# Patient Record
Sex: Female | Born: 1960
Health system: Southern US, Community
[De-identification: ages and names within clinical notes are randomized; demographics above are authoritative.]

## PROBLEM LIST (undated history)

## (undated) DIAGNOSIS — M545 Low back pain, unspecified: Secondary | ICD-10-CM

## (undated) DIAGNOSIS — I10 Essential (primary) hypertension: Secondary | ICD-10-CM

## (undated) DIAGNOSIS — Z87442 Personal history of urinary calculi: Secondary | ICD-10-CM

## (undated) DIAGNOSIS — E785 Hyperlipidemia, unspecified: Secondary | ICD-10-CM

## (undated) DIAGNOSIS — J45909 Unspecified asthma, uncomplicated: Secondary | ICD-10-CM

## (undated) DIAGNOSIS — G4733 Obstructive sleep apnea (adult) (pediatric): Principal | ICD-10-CM

## (undated) DIAGNOSIS — K219 Gastro-esophageal reflux disease without esophagitis: Secondary | ICD-10-CM

## (undated) HISTORY — PX: ABDOMINAL HYSTERECTOMY: SHX81

## (undated) HISTORY — DX: Morbid (severe) obesity due to excess calories: E66.01

## (undated) HISTORY — DX: Low back pain, unspecified: M54.50

## (undated) HISTORY — DX: Hyperlipidemia, unspecified: E78.5

## (undated) HISTORY — DX: Low back pain: M54.5

## (undated) HISTORY — DX: Essential (primary) hypertension: I10

## (undated) HISTORY — PX: TUBAL LIGATION: SHX77

## (undated) HISTORY — DX: Obstructive sleep apnea (adult) (pediatric): G47.33

## (undated) HISTORY — PX: DENTAL SURGERY: SHX609

---

## 2003-02-02 ENCOUNTER — Other Ambulatory Visit: Admission: RE | Admit: 2003-02-02 | Discharge: 2003-02-02 | Payer: Self-pay | Admitting: Obstetrics & Gynecology

## 2004-05-06 LAB — HM MAMMOGRAPHY

## 2004-09-11 ENCOUNTER — Ambulatory Visit: Payer: Self-pay | Admitting: Internal Medicine

## 2004-09-24 ENCOUNTER — Encounter (INDEPENDENT_AMBULATORY_CARE_PROVIDER_SITE_OTHER): Payer: Self-pay | Admitting: *Deleted

## 2004-09-24 ENCOUNTER — Observation Stay (HOSPITAL_COMMUNITY): Admission: RE | Admit: 2004-09-24 | Discharge: 2004-09-25 | Payer: Self-pay | Admitting: Obstetrics & Gynecology

## 2004-10-18 ENCOUNTER — Ambulatory Visit (HOSPITAL_COMMUNITY): Admission: RE | Admit: 2004-10-18 | Discharge: 2004-10-18 | Payer: Self-pay | Admitting: Internal Medicine

## 2004-10-25 ENCOUNTER — Ambulatory Visit: Payer: Self-pay | Admitting: Internal Medicine

## 2004-10-25 ENCOUNTER — Ambulatory Visit (HOSPITAL_COMMUNITY): Admission: RE | Admit: 2004-10-25 | Discharge: 2004-10-25 | Payer: Self-pay | Admitting: Internal Medicine

## 2004-10-30 ENCOUNTER — Ambulatory Visit: Payer: Self-pay | Admitting: Internal Medicine

## 2005-05-17 ENCOUNTER — Ambulatory Visit: Payer: Self-pay | Admitting: Internal Medicine

## 2005-10-08 ENCOUNTER — Ambulatory Visit: Payer: Self-pay | Admitting: Internal Medicine

## 2005-10-14 ENCOUNTER — Emergency Department (HOSPITAL_COMMUNITY): Admission: EM | Admit: 2005-10-14 | Discharge: 2005-10-14 | Payer: Self-pay | Admitting: Family Medicine

## 2005-10-21 ENCOUNTER — Emergency Department (HOSPITAL_COMMUNITY): Admission: EM | Admit: 2005-10-21 | Discharge: 2005-10-21 | Payer: Self-pay | Admitting: Family Medicine

## 2005-10-25 ENCOUNTER — Ambulatory Visit: Payer: Self-pay | Admitting: Internal Medicine

## 2006-02-13 ENCOUNTER — Inpatient Hospital Stay (HOSPITAL_COMMUNITY): Admission: EM | Admit: 2006-02-13 | Discharge: 2006-02-14 | Payer: Self-pay | Admitting: Family Medicine

## 2006-02-13 ENCOUNTER — Encounter: Payer: Self-pay | Admitting: Cardiology

## 2006-02-13 ENCOUNTER — Ambulatory Visit: Payer: Self-pay | Admitting: Cardiology

## 2006-02-14 ENCOUNTER — Ambulatory Visit: Payer: Self-pay | Admitting: Internal Medicine

## 2006-02-25 ENCOUNTER — Ambulatory Visit: Payer: Self-pay

## 2006-02-25 ENCOUNTER — Ambulatory Visit: Payer: Self-pay | Admitting: Internal Medicine

## 2006-03-04 ENCOUNTER — Ambulatory Visit: Payer: Self-pay | Admitting: Cardiology

## 2006-05-13 ENCOUNTER — Ambulatory Visit: Payer: Self-pay | Admitting: Cardiology

## 2006-06-20 ENCOUNTER — Ambulatory Visit: Payer: Self-pay | Admitting: Internal Medicine

## 2006-06-27 ENCOUNTER — Ambulatory Visit: Payer: Self-pay | Admitting: Cardiology

## 2006-06-27 LAB — CONVERTED CEMR LAB
CO2: 32 meq/L (ref 19–32)
GFR calc Af Amer: 78 mL/min
GFR calc non Af Amer: 64 mL/min
Glucose, Bld: 123 mg/dL — ABNORMAL HIGH (ref 70–99)
Sodium: 140 meq/L (ref 135–145)

## 2006-07-03 ENCOUNTER — Ambulatory Visit: Payer: Self-pay | Admitting: Internal Medicine

## 2006-07-03 ENCOUNTER — Ambulatory Visit: Payer: Self-pay | Admitting: Cardiology

## 2006-07-08 ENCOUNTER — Ambulatory Visit: Payer: Self-pay | Admitting: Cardiology

## 2006-07-08 LAB — CONVERTED CEMR LAB
Basophils Absolute: 0 10*3/uL (ref 0.0–0.1)
Chloride: 105 meq/L (ref 96–112)
Eosinophils Absolute: 0.2 10*3/uL (ref 0.0–0.6)
GFR calc Af Amer: 70 mL/min
GFR calc non Af Amer: 58 mL/min
Glucose, Bld: 124 mg/dL — ABNORMAL HIGH (ref 70–99)
HCT: 40.4 % (ref 36.0–46.0)
Lymphocytes Relative: 31.1 % (ref 12.0–46.0)
MCHC: 33.2 g/dL (ref 30.0–36.0)
MCV: 92.9 fL (ref 78.0–100.0)
Neutro Abs: 3.9 10*3/uL (ref 1.4–7.7)
Neutrophils Relative %: 58.4 % (ref 43.0–77.0)
Platelets: 428 10*3/uL — ABNORMAL HIGH (ref 150–400)
Potassium: 4.1 meq/L (ref 3.5–5.1)
RBC: 4.35 M/uL (ref 3.87–5.11)
Sodium: 140 meq/L (ref 135–145)
aPTT: 35.3 s (ref 26.5–36.5)

## 2006-07-11 ENCOUNTER — Ambulatory Visit: Payer: Self-pay | Admitting: Cardiology

## 2006-07-11 ENCOUNTER — Ambulatory Visit (HOSPITAL_COMMUNITY): Admission: RE | Admit: 2006-07-11 | Discharge: 2006-07-11 | Payer: Self-pay | Admitting: Cardiology

## 2006-07-11 ENCOUNTER — Encounter (INDEPENDENT_AMBULATORY_CARE_PROVIDER_SITE_OTHER): Payer: Self-pay | Admitting: *Deleted

## 2006-08-05 ENCOUNTER — Ambulatory Visit: Payer: Self-pay

## 2006-08-05 ENCOUNTER — Ambulatory Visit: Payer: Self-pay | Admitting: Cardiology

## 2006-09-24 ENCOUNTER — Encounter: Payer: Self-pay | Admitting: Internal Medicine

## 2006-09-24 ENCOUNTER — Ambulatory Visit: Payer: Self-pay | Admitting: Internal Medicine

## 2006-12-25 ENCOUNTER — Encounter: Payer: Self-pay | Admitting: Internal Medicine

## 2006-12-25 DIAGNOSIS — I701 Atherosclerosis of renal artery: Secondary | ICD-10-CM | POA: Insufficient documentation

## 2006-12-25 DIAGNOSIS — I10 Essential (primary) hypertension: Secondary | ICD-10-CM | POA: Insufficient documentation

## 2006-12-29 DIAGNOSIS — E7800 Pure hypercholesterolemia, unspecified: Secondary | ICD-10-CM | POA: Insufficient documentation

## 2006-12-29 DIAGNOSIS — M545 Low back pain, unspecified: Secondary | ICD-10-CM | POA: Insufficient documentation

## 2006-12-29 DIAGNOSIS — J309 Allergic rhinitis, unspecified: Secondary | ICD-10-CM | POA: Insufficient documentation

## 2006-12-29 DIAGNOSIS — E785 Hyperlipidemia, unspecified: Secondary | ICD-10-CM | POA: Insufficient documentation

## 2007-06-22 ENCOUNTER — Telehealth: Payer: Self-pay | Admitting: Internal Medicine

## 2007-07-07 ENCOUNTER — Ambulatory Visit: Payer: Self-pay | Admitting: Internal Medicine

## 2007-07-07 DIAGNOSIS — R351 Nocturia: Secondary | ICD-10-CM | POA: Insufficient documentation

## 2007-07-07 DIAGNOSIS — R7309 Other abnormal glucose: Secondary | ICD-10-CM | POA: Insufficient documentation

## 2007-07-21 ENCOUNTER — Ambulatory Visit: Payer: Self-pay | Admitting: Pulmonary Disease

## 2007-07-21 DIAGNOSIS — G4730 Sleep apnea, unspecified: Secondary | ICD-10-CM | POA: Insufficient documentation

## 2007-08-11 ENCOUNTER — Ambulatory Visit: Payer: Self-pay | Admitting: Internal Medicine

## 2007-08-12 ENCOUNTER — Telehealth (INDEPENDENT_AMBULATORY_CARE_PROVIDER_SITE_OTHER): Payer: Self-pay | Admitting: *Deleted

## 2007-08-17 LAB — CONVERTED CEMR LAB
ALT: 24 units/L (ref 0–35)
AST: 22 units/L (ref 0–37)
Alkaline Phosphatase: 67 units/L (ref 39–117)
Bilirubin, Direct: 0.1 mg/dL (ref 0.0–0.3)
CO2: 34 meq/L — ABNORMAL HIGH (ref 19–32)
Chloride: 98 meq/L (ref 96–112)
Glucose, Bld: 117 mg/dL — ABNORMAL HIGH (ref 70–99)
HDL: 43.8 mg/dL (ref >39.0)
Microalb, Ur: 2.2 mg/dL — ABNORMAL HIGH (ref 0.0–1.9)
Potassium: 3.1 meq/L — ABNORMAL LOW (ref 3.5–5.1)
Sodium: 139 meq/L (ref 135–145)
TSH: 1.16 microintl units/mL (ref 0.35–5.50)
Total Bilirubin: 0.6 mg/dL (ref 0.3–1.2)
Total CHOL/HDL Ratio: 4.2
Total Protein: 7.1 g/dL (ref 6.0–8.3)

## 2007-08-19 ENCOUNTER — Ambulatory Visit: Payer: Self-pay | Admitting: Internal Medicine

## 2007-08-19 DIAGNOSIS — E876 Hypokalemia: Secondary | ICD-10-CM | POA: Insufficient documentation

## 2007-09-03 ENCOUNTER — Telehealth: Payer: Self-pay | Admitting: Internal Medicine

## 2007-09-14 ENCOUNTER — Ambulatory Visit: Payer: Self-pay | Admitting: Internal Medicine

## 2007-09-14 LAB — CONVERTED CEMR LAB
CO2: 34 meq/L — ABNORMAL HIGH (ref 19–32)
Calcium: 8.7 mg/dL (ref 8.4–10.5)
Glucose, Bld: 147 mg/dL — ABNORMAL HIGH (ref 70–99)
Potassium: 2.5 meq/L — CL (ref 3.5–5.1)
Sodium: 140 meq/L (ref 135–145)

## 2007-09-18 ENCOUNTER — Telehealth: Payer: Self-pay | Admitting: Internal Medicine

## 2007-09-18 ENCOUNTER — Ambulatory Visit: Payer: Self-pay | Admitting: Internal Medicine

## 2007-09-27 ENCOUNTER — Emergency Department (HOSPITAL_COMMUNITY): Admission: EM | Admit: 2007-09-27 | Discharge: 2007-09-27 | Payer: Self-pay | Admitting: Family Medicine

## 2007-09-30 ENCOUNTER — Telehealth: Payer: Self-pay | Admitting: *Deleted

## 2007-09-30 LAB — CONVERTED CEMR LAB: Potassium: 3.4 meq/L — ABNORMAL LOW (ref 3.5–5.3)

## 2007-10-28 ENCOUNTER — Ambulatory Visit: Payer: Self-pay | Admitting: Internal Medicine

## 2007-10-30 LAB — CONVERTED CEMR LAB
CO2: 31 meq/L (ref 19–32)
Calcium: 9.1 mg/dL (ref 8.4–10.5)
GFR calc Af Amer: 77 mL/min
Glucose, Bld: 90 mg/dL (ref 70–99)
Potassium: 2.9 meq/L — ABNORMAL LOW (ref 3.5–5.1)
Sodium: 139 meq/L (ref 135–145)

## 2007-11-23 ENCOUNTER — Encounter: Payer: Self-pay | Admitting: Internal Medicine

## 2007-11-23 ENCOUNTER — Emergency Department (HOSPITAL_COMMUNITY): Admission: EM | Admit: 2007-11-23 | Discharge: 2007-11-23 | Payer: Self-pay | Admitting: *Deleted

## 2007-11-23 ENCOUNTER — Telehealth: Payer: Self-pay | Admitting: Internal Medicine

## 2007-12-02 ENCOUNTER — Ambulatory Visit: Payer: Self-pay | Admitting: Internal Medicine

## 2007-12-02 DIAGNOSIS — T50995A Adverse effect of other drugs, medicaments and biological substances, initial encounter: Secondary | ICD-10-CM | POA: Insufficient documentation

## 2007-12-09 ENCOUNTER — Telehealth: Payer: Self-pay | Admitting: *Deleted

## 2008-01-06 ENCOUNTER — Encounter: Payer: Self-pay | Admitting: *Deleted

## 2008-01-12 ENCOUNTER — Telehealth: Payer: Self-pay | Admitting: *Deleted

## 2008-01-12 LAB — CONVERTED CEMR LAB
Chloride: 97 meq/L (ref 96–112)
GFR calc Af Amer: 87 mL/min
GFR calc non Af Amer: 72 mL/min
Glucose, Bld: 115 mg/dL — ABNORMAL HIGH (ref 70–99)
Potassium: 2.9 meq/L — ABNORMAL LOW (ref 3.5–5.1)
Sodium: 137 meq/L (ref 135–145)

## 2008-02-12 ENCOUNTER — Telehealth: Payer: Self-pay | Admitting: *Deleted

## 2008-03-14 ENCOUNTER — Telehealth: Payer: Self-pay | Admitting: Internal Medicine

## 2008-05-23 ENCOUNTER — Encounter: Payer: Self-pay | Admitting: Internal Medicine

## 2008-05-23 ENCOUNTER — Telehealth: Payer: Self-pay | Admitting: *Deleted

## 2008-05-25 ENCOUNTER — Telehealth: Payer: Self-pay | Admitting: *Deleted

## 2008-05-27 ENCOUNTER — Ambulatory Visit: Payer: Self-pay | Admitting: Internal Medicine

## 2008-06-03 LAB — CONVERTED CEMR LAB
ALT: 20 units/L (ref 0–35)
Albumin: 3.4 g/dL — ABNORMAL LOW (ref 3.5–5.2)
CO2: 34 meq/L — ABNORMAL HIGH (ref 19–32)
Calcium: 9.1 mg/dL (ref 8.4–10.5)
Creatinine, Ser: 0.9 mg/dL (ref 0.4–1.2)
GFR calc Af Amer: 87 mL/min
Glucose, Bld: 141 mg/dL — ABNORMAL HIGH (ref 70–99)
HDL: 40.6 mg/dL (ref >39.0)
Hgb A1c MFr Bld: 7 % — ABNORMAL HIGH (ref 4.6–6.0)
TSH: 1.03 microintl units/mL (ref 0.35–5.50)
Total Protein: 7.5 g/dL (ref 6.0–8.3)
Triglycerides: 110 mg/dL (ref 0–149)
VLDL: 22 mg/dL (ref 0–40)

## 2008-06-07 ENCOUNTER — Telehealth: Payer: Self-pay | Admitting: *Deleted

## 2008-06-13 ENCOUNTER — Telehealth: Payer: Self-pay | Admitting: Internal Medicine

## 2008-06-29 ENCOUNTER — Emergency Department (HOSPITAL_COMMUNITY): Admission: EM | Admit: 2008-06-29 | Discharge: 2008-06-29 | Payer: Self-pay | Admitting: Emergency Medicine

## 2008-09-09 ENCOUNTER — Telehealth: Payer: Self-pay | Admitting: *Deleted

## 2008-09-09 ENCOUNTER — Telehealth: Payer: Self-pay | Admitting: Internal Medicine

## 2008-09-09 ENCOUNTER — Ambulatory Visit: Payer: Self-pay | Admitting: Internal Medicine

## 2008-09-09 DIAGNOSIS — R350 Frequency of micturition: Secondary | ICD-10-CM | POA: Insufficient documentation

## 2008-09-16 ENCOUNTER — Ambulatory Visit: Payer: Self-pay | Admitting: Internal Medicine

## 2008-09-16 LAB — CONVERTED CEMR LAB
BUN: 12 mg/dL (ref 6–23)
Creatinine, Ser: 1 mg/dL (ref 0.4–1.2)
GFR calc non Af Amer: 76.89 mL/min (ref >60)
Potassium: 2.9 meq/L — ABNORMAL LOW (ref 3.5–5.1)

## 2008-09-19 ENCOUNTER — Telehealth: Payer: Self-pay | Admitting: Internal Medicine

## 2008-10-07 ENCOUNTER — Telehealth: Payer: Self-pay | Admitting: Internal Medicine

## 2008-10-12 ENCOUNTER — Ambulatory Visit: Payer: Self-pay | Admitting: Internal Medicine

## 2008-10-18 LAB — CONVERTED CEMR LAB: Potassium: 3.6 meq/L (ref 3.5–5.1)

## 2008-12-05 ENCOUNTER — Ambulatory Visit: Payer: Self-pay | Admitting: Internal Medicine

## 2008-12-05 ENCOUNTER — Telehealth: Payer: Self-pay | Admitting: Internal Medicine

## 2008-12-09 LAB — CONVERTED CEMR LAB
Calcium: 8.7 mg/dL (ref 8.4–10.5)
Creatinine, Ser: 0.8 mg/dL (ref 0.4–1.2)
GFR calc non Af Amer: 99.37 mL/min (ref >60)
Hgb A1c MFr Bld: 6.5 % (ref 4.6–6.5)
Magnesium: 2 mg/dL (ref 1.5–2.5)

## 2009-06-09 ENCOUNTER — Ambulatory Visit: Payer: Self-pay | Admitting: Internal Medicine

## 2009-06-26 ENCOUNTER — Telehealth: Payer: Self-pay | Admitting: Internal Medicine

## 2009-07-07 ENCOUNTER — Telehealth: Payer: Self-pay | Admitting: *Deleted

## 2009-07-28 ENCOUNTER — Encounter: Payer: Self-pay | Admitting: *Deleted

## 2009-09-04 ENCOUNTER — Encounter: Payer: Self-pay | Admitting: Internal Medicine

## 2009-09-08 ENCOUNTER — Ambulatory Visit: Payer: Self-pay | Admitting: Internal Medicine

## 2009-09-08 LAB — CONVERTED CEMR LAB
BUN: 12 mg/dL (ref 6–23)
Cholesterol: 184 mg/dL (ref 0–200)
GFR calc non Af Amer: 99.04 mL/min (ref >60)
Total CHOL/HDL Ratio: 4
Triglycerides: 105 mg/dL (ref 0.0–149.0)

## 2009-09-15 ENCOUNTER — Ambulatory Visit: Payer: Self-pay | Admitting: Internal Medicine

## 2009-09-27 ENCOUNTER — Telehealth: Payer: Self-pay | Admitting: *Deleted

## 2009-10-13 ENCOUNTER — Ambulatory Visit: Payer: Self-pay | Admitting: Internal Medicine

## 2009-10-13 DIAGNOSIS — R05 Cough: Secondary | ICD-10-CM

## 2009-10-13 DIAGNOSIS — R053 Chronic cough: Secondary | ICD-10-CM | POA: Insufficient documentation

## 2009-11-17 ENCOUNTER — Ambulatory Visit: Payer: Self-pay | Admitting: Internal Medicine

## 2009-11-17 DIAGNOSIS — M79609 Pain in unspecified limb: Secondary | ICD-10-CM | POA: Insufficient documentation

## 2009-11-20 ENCOUNTER — Telehealth: Payer: Self-pay | Admitting: *Deleted

## 2009-11-21 ENCOUNTER — Encounter: Payer: Self-pay | Admitting: Internal Medicine

## 2009-11-22 ENCOUNTER — Telehealth: Payer: Self-pay | Admitting: *Deleted

## 2009-11-27 ENCOUNTER — Telehealth: Payer: Self-pay | Admitting: Internal Medicine

## 2009-12-11 ENCOUNTER — Ambulatory Visit: Payer: Self-pay | Admitting: Internal Medicine

## 2009-12-11 ENCOUNTER — Ambulatory Visit: Payer: Self-pay

## 2009-12-11 ENCOUNTER — Ambulatory Visit (HOSPITAL_COMMUNITY): Admission: RE | Admit: 2009-12-11 | Discharge: 2009-12-11 | Payer: Self-pay | Admitting: Internal Medicine

## 2009-12-11 ENCOUNTER — Encounter: Payer: Self-pay | Admitting: Internal Medicine

## 2009-12-12 ENCOUNTER — Telehealth: Payer: Self-pay | Admitting: *Deleted

## 2010-01-04 ENCOUNTER — Ambulatory Visit: Payer: Self-pay | Admitting: Cardiology

## 2010-01-17 ENCOUNTER — Telehealth: Payer: Self-pay | Admitting: Cardiology

## 2010-01-19 ENCOUNTER — Encounter: Payer: Self-pay | Admitting: Cardiology

## 2010-02-02 ENCOUNTER — Telehealth: Payer: Self-pay | Admitting: Cardiology

## 2010-02-20 ENCOUNTER — Encounter: Payer: Self-pay | Admitting: Internal Medicine

## 2010-03-02 ENCOUNTER — Telehealth: Payer: Self-pay | Admitting: Cardiology

## 2010-03-02 ENCOUNTER — Telehealth: Payer: Self-pay | Admitting: *Deleted

## 2010-03-20 ENCOUNTER — Encounter: Payer: Self-pay | Admitting: Internal Medicine

## 2010-05-12 ENCOUNTER — Encounter: Payer: Self-pay | Admitting: Internal Medicine

## 2010-05-25 ENCOUNTER — Telehealth: Payer: Self-pay | Admitting: Cardiology

## 2010-05-27 ENCOUNTER — Encounter: Payer: Self-pay | Admitting: Internal Medicine

## 2010-06-03 LAB — CONVERTED CEMR LAB
BUN: 11 mg/dL (ref 6–23)
Basophils Absolute: 0 10*3/uL (ref 0.0–0.1)
Blood in Urine, dipstick: NEGATIVE
Calcium: 8.9 mg/dL (ref 8.4–10.5)
Chloride: 103 meq/L (ref 96–112)
Creatinine, Ser: 0.9 mg/dL (ref 0.4–1.2)
Eosinophils Absolute: 0.1 10*3/uL (ref 0.0–0.7)
Hemoglobin: 13.7 g/dL (ref 12.0–15.0)
Hgb A1c MFr Bld: 6.9 % — ABNORMAL HIGH (ref 4.6–6.5)
Lymphocytes Relative: 32.3 % (ref 12.0–46.0)
Lymphs Abs: 1.7 10*3/uL (ref 0.7–4.0)
MCHC: 34.7 g/dL (ref 30.0–36.0)
Neutro Abs: 3.2 10*3/uL (ref 1.4–7.7)
Nitrite: NEGATIVE
RDW: 12.1 % (ref 11.5–14.6)
Specific Gravity, Urine: 1.02
WBC Urine, dipstick: NEGATIVE

## 2010-06-05 NOTE — Miscellaneous (Signed)
Summary: Appointment Canceled  Appointment status changed to canceled by [employer] on 11/27/2009 3:30 PM.  Cancellation Comments --------------------- appt @ 4:30/echo/enlarged heart on xray/htn  Appointment Information ----------------------- Appt Type:  CARDIOLOGY ANCILLARY VISIT      Date:  Thursday, November 30, 2009      Time:  4:00 PM for 60 min   Urgency:  Routine   Made By:  Pearson Grippe  To Visit:  [id number]    Reason:  appt @ 4:30/echo/enlarged heart on xray/htn  Appt Comments ------------- -- 11/27/09 15:30: ([facility]) CANCELED -- appt @ 4:30/echo/enlarged heart on xray/htn -- 11/21/09 12:45: ([facility]) BOOKED -- Routine CARDIOLOGY ANCILLARY VISIT at 11/30/2009 4:00 PM for 60 min appt @ 4:30/echo/enlarged heart on xray/htn  Appended Document: Appointment Canceled Help her rescedule the echo  and ask why it was canceled  ....she still needs the echo cardiogram done   soon    and definitely  before  appt with cards as this is not until September .     Appended Document: Appointment Canceled Pt rescheduled appt for Aug 8th.

## 2010-06-05 NOTE — Letter (Signed)
Summary: [facility]  [facility]   Imported By: Maryln Gottron 03/26/2010 12:14:59  _____________________________________________________________________  External Attachment:    Type:   Image     Comment:   External Document

## 2010-06-05 NOTE — Assessment & Plan Note (Signed)
Summary: np6/dx:chest and back pain/post echo/lg  Medications Added NORVASC 10 MG TABS (AMLODIPINE BESYLATE) one daily      Allergies Added:   Visit Type:  Follow-up Primary Provider:  Dr. Fabian Sharp   History of Present Illness: 50 yo with history of HTN and obesity presents for cardiac evaluation.  Patient has a history of early-onset HTN (since birth of her son at age 73) and was seen by Dr. Samule Ohm in the past.  She underwent an evaluation for secondary causes of HTN that included renal artery angiogram (no fibromuscular dysplasia).  She has been on many BP meds and has had side effects limiting the use of most.  She is only on Norvasc now and is tolerating it well.  She did not take her medicine this am, and BP is 159/94.  Has been running 140s/90s at home.  She does snore and stop breathing at night.  She has not had a sleep study.  No chest pain or dyspnea.  She does not get much exercise and work is stressful.  She tries to watch her sodium intake.   ECG: NSR, LVH  Current Medications (verified): 1)  Norvasc 5 Mg  Tabs (Amlodipine Besylate) .... One Tab Once Daily  Allergies (verified): 1)  ! * Atacand 2)  ! Motrin 3)  ! * Micardis 4)  ! Vito Berger  Past History:  Past Medical History: 1. Hypertension: x many years (since birth of her son at 47).  Had workup with renal artery angiogram that showed no evidence for fibromuscular dysplasia (no significant renal artery stenosis).  Echo (8/11): EF 60-65%, mild LVH, no regional WMAs.  2. Morbid Obesity 3. Allergic rhinitis 4. Diabetes mellitus, type II? vs hyperglycemia   5. Hyperlipidemia 6. Low back pain  Family History: Family History Hypertension Fa died MVA Mom HTN and cancer, is deceased  Social History: Never Smoked Married Alcohol use-no  Drug use-no Regular exercise-yes HH of 4 no pets  works at [employer]   Review of Systems       All systems reviewed and negative except as per HPI.   Vital  Signs:  Patient profile:   50 year old female Menstrual status:  hysterectomy Height:      67 inches Weight:      299 pounds BMI:     47.00 Pulse rate:   82 / minute BP sitting:   159 / 94  (left arm) Cuff size:   large  Vitals Entered By: Burnett Kanaris, CNA (January 04, 2010 12:09 PM)  Physical Exam  General:  Well developed, well nourished, in no acute distress. Obese.  Head:  normocephalic and atraumatic Nose:  no deformity, discharge, inflammation, or lesions Mouth:  Teeth, gums and palate normal. Oral mucosa normal. Neck:  Neck supple, no JVD. No masses, thyromegaly or abnormal cervical nodes. Lungs:  Clear bilaterally to auscultation and percussion. Heart:  Non-displaced PMI, chest non-tender; regular rate and rhythm, S1, S2 without murmurs, rubs or gallops. Carotid upstroke normal, no bruit. Pedals normal pulses. No edema, no varicosities. Abdomen:  Bowel sounds positive; abdomen soft and non-tender without masses, organomegaly, or hernias noted. No hepatosplenomegaly. Extremities:  No clubbing or cyanosis. Neurologic:  Alert and oriented x 3. Skin:  Intact without lesions or rashes. Psych:  Normal affect.   Impression & Recommendations:  Problem # 1:  HYPERTENSION (ICD-401.9) BP is running high today but has not taken Norvasc.  Even with her BP med, BP still runs 140s/90s.  No  renal artery stenosis.  - Increase Norvasc to 10 mg daily - Needs sleep study: suspect OSA, which can contribute to BP elevation. - She needs weight loss.  - Watch Na in diet.  - Followup 1 month to assess BP.  Would consider spironolactone if she needs more BP control given early-onset and resistant HTN (could be aldosterone-dependent).   Problem # 2:  MORBID OBESITY (ICD-278.01) Weight loss would certainly help BP. We discussed diet and exercise.    Other Orders: Misc. Referral (Misc. Ref)  Patient Instructions: 1)  Your physician has recommended you make the following change in your  medication:  2)  Increase Norvasc to 10mg  daily. 3)  Your physician has recommended that you have a sleep study.  This test records several body functions during sleep, including:  brain activity, eye movement, oxygen and carbon dioxide blood levels, heart rate and rhythm, breathing rate and rhythm, the flow of air through your mouth and nose, snoring, body muscle movements, and chest and belly movement. 4)  Your physician recommends that you schedule a follow-up appointment in: 1 month with Dr Shirlee Latch. Prescriptions: NORVASC 10 MG TABS (AMLODIPINE BESYLATE) one daily  #90 x 3   Entered by:   Katina Dung, RN, BSN   Authorized by:   Marca Ancona, MD   Signed by:   Katina Dung, RN, BSN on 01/04/2010   Method used:   Electronically to        [facility] Environmental education officer)       [facility]       [address]       [place], [place]  [address]       Ph: [phone]       Fax: [phone]   RxID:   [id number]

## 2010-06-05 NOTE — Progress Notes (Signed)
Summary: REFILL REQ ON MED  Phone Note Call from Patient   Caller: Patient [phone] Reason for Call: Refill Medication Summary of Call: Pt called to req that a refill on med : NORVASC.... Pt req that same be sent into [facility] on [address] / [facility].  because she is almost out of med... [name] KitchenPt usually uses [facility] but she is afraid that she will run out of med before they can get her the medication refill.... She adv that she contacted [facility] to have them send [facility] a refill request and they told her that they don't do that anymore during the beginning of the year because they are so busy.  Initial call taken by: Debbra Riding,  June 26, 2009 8:48 AM  Follow-up for Phone Call        Spoke to pt and she needs a [facility] and [facility]. Then by the way she  also states that she was having problems last week where she couldn't remember stuff and couldn't words get stuff out. Then pt was just repeating stuff. Pt was not having any blurred vision, no numbness or tingling down arms or legs. I offered pt an appt but pt couldn't come in tomorrow. Pt states that she would call back for an appt either friday or monday because she is going to take some time off then.  Follow-up by: Romualdo Bolk, CMA Duncan Dull),  June 26, 2009 12:25 PM  Additional Follow-up for Phone Call Additional follow up Details #1::        should seek emergent care if has  any further episodes. Additional Follow-up by: Madelin Headings MD,  June 27, 2009 12:47 PM    Prescriptions: NORVASC 5 MG  TABS (AMLODIPINE BESYLATE) one tab once daily  #90 x 3   Entered by:   Romualdo Bolk, CMA (AAMA)   Authorized by:   Madelin Headings MD   Signed by:   Romualdo Bolk, CMA (AAMA) on 06/26/2009   Method used:   Electronically to        [facility]. [address] [phone]* (retail)       [address]       [place], [place]  [address]       Ph: [phone] or  [phone]       Fax: [phone]   RxID:   [id number] NORVASC 5 MG  TABS (AMLODIPINE BESYLATE) one tab once daily  #30 x 0   Entered by:   Romualdo Bolk, CMA (AAMA)   Authorized by:   Madelin Headings MD   Signed by:   Romualdo Bolk, CMA (AAMA) on 06/26/2009   Method used:   Electronically to        [facility]. [address] [phone]* (retail)       [address]       [place], [place]  [address]       Ph: [phone] or [phone]       Fax: [phone]   RxID:   [id number]

## 2010-06-05 NOTE — Progress Notes (Signed)
Summary: REQ FOR RESULTS  Phone Note Call from Patient   Caller: Patient Summary of Call: Pt would like results of echo.... Pt can be reached at [phone] (cell). Initial call taken by: Debbra Riding,  December 12, 2009 12:09 PM  Follow-up for Phone Call        LM on pt's machine that md is out of the office and we will call her once md looks at the results. Follow-up by: Romualdo Bolk, CMA Duncan Dull),  December 12, 2009 12:17 PM  Additional Follow-up for Phone Call Additional follow up Details #1::        tell patient has mild thickening of heart muscle  as can be seen with  hypertension.   I want her to keep appt with cardiology and need to have BP under control and lose weight in the meantime. Additional Follow-up by: Madelin Headings MD,  December 18, 2009 10:04 PM    Additional Follow-up for Phone Call Additional follow up Details #2::    LMTOCB Romualdo Bolk, CMA (AAMA)  December 19, 2009 8:45 AM Pt called back and left a voicemail saying that we can leave her a message on her machine about the results. She also needed a refill on her norvasc sent to [facility]. Rx sent to pharmacy and results left on voicemail. Follow-up by: Romualdo Bolk, CMA (AAMA),  December 21, 2009 12:21 PM  Prescriptions: NORVASC 5 MG  TABS (AMLODIPINE BESYLATE) one tab once daily  #90 x 2   Entered by:   Romualdo Bolk, CMA (AAMA)   Authorized by:   Madelin Headings MD   Signed by:   Romualdo Bolk, CMA (AAMA) on 12/21/2009   Method used:   Electronically to        [facility] [address] (mail-order)       [facility]       [address]       [place], [place]  [address]       Ph: [phone]       Fax: [phone]   RxID:   [id number]   Appended Document: REQ FOR RESULTS    Prescriptions: NORVASC 5 MG  TABS (AMLODIPINE BESYLATE) one tab once daily  #90 x 2   Entered by:   Romualdo Bolk, CMA (AAMA)   Authorized by:   Madelin Headings MD   Signed  by:   Romualdo Bolk, CMA (AAMA) on 12/21/2009   Method used:   Faxed to ...       [facility] [address] Environmental education officer)       [facility]       [address]       [place], [place]  [address]       Ph: [phone]       Fax: [phone]   RxID:   [id number]  Rx didn't go thru electronically so we had to fax rx to pharmacy. Romualdo Bolk, CMA (AAMA)  December 21, 2009 12:27 PM

## 2010-06-05 NOTE — Progress Notes (Signed)
Summary: scheduling sleep study   ---- Converted from flag ---- ---- 01/17/2010 10:16 AM, [name] wrote: Patient has not returned any of the sleep center phone calls. [name]  January 17, 2010 10:16 AM     ---- 01/05/2010 3:17 PM, [name] wrote:   ---- 01/04/2010 1:05 PM, Katina Dung, RN, BSN wrote: The following orders have been entered for this patient and placed on Admin Hold:  Type:     Referral       Code:   Misc. Ref Description:   Misc. Referral Order Date:   01/04/2010   Authorized By:   Marca Ancona, MD Order #:   [id number] Clinical Notes:   Type of Referral: sleep study  Reason: snoring --R/O sleep apnea ------------------------------

## 2010-06-05 NOTE — Letter (Signed)
Summary: [facility]  [facility]   Imported By: Maryln Gottron 09/19/2009 12:26:15  _____________________________________________________________________  External Attachment:    Type:   Image     Comment:   External Document

## 2010-06-05 NOTE — Progress Notes (Signed)
Summary: REQ FOR RETURN CALL  Phone Note Call from Patient   Caller: Patient   [phone] Summary of Call: Pt would like a return call for medication concerns....  [phone]. Initial call taken by: Debbra Riding,  November 22, 2009 11:51 AM  Follow-up for Phone Call        LMTOCB Follow-up by: Romualdo Bolk, CMA Duncan Dull),  November 22, 2009 3:00 PM  Additional Follow-up for Phone Call Additional follow up Details #1::        See note about x-ray. Additional Follow-up by: Romualdo Bolk, CMA Duncan Dull),  November 27, 2009 4:37 PM

## 2010-06-05 NOTE — Progress Notes (Signed)
Summary: c/o alot of swelling -over counter water pill   Phone Note Call from Patient Call back at Home Phone [phone] Call back at [phone] o.k. to  leave message   Caller: Patient Reason for Call: Talk to Nurse Summary of Call: per pt calling, c/o alot of swelling, pt got a over the counter water pill [facility]. Initial call taken by: Lorne Skeens,  February 02, 2010 11:43 AM  Follow-up for Phone Call        02/02/10--1230pm--LMTCB--nt  [facility] ON MONDAY  Scherrie Bateman, LPN  February 02, 2010 5:22 PM     Appended Document: c/o alot of swelling -over counter water pill If her BP is still running high, could try her on chlorthalidone 12.5 mg daily with KCl 20 mEq daily.   Appended Document: c/o alot of swelling -over counter water pill LMTCB   Appended Document: c/o alot of swelling -over counter water pill LMTCB   Appended Document: c/o alot of swelling -over counter water pill LMTCB  Appended Document: c/o alot of swelling -over counter water pill I talked with pt--she had not had her B/P checked--she stated she would go to the nurse at work and have her check her B/P  a couple of times and call back to give me the readings--she is aware of Dr Alford Highland recommendation

## 2010-06-05 NOTE — Consult Note (Signed)
Summary: [facility]  [facility]   Imported By: Maryln Gottron 02/26/2010 13:28:58  _____________________________________________________________________  External Attachment:    Type:   Image     Comment:   External Document

## 2010-06-05 NOTE — Progress Notes (Signed)
Summary: refill  Phone Note From Pharmacy   [facility] Summary of Call: amlodipine- Pt needs to schedule a follow up appt. Initial call taken by: Romualdo Bolk, CMA Duncan Dull),  July 07, 2009 2:02 PM  Follow-up for Phone Call        Rx sent electronically.  LMTOCB  Follow-up by: Romualdo Bolk, CMA (AAMA),  July 07, 2009 3:13 PM  Additional Follow-up for Phone Call Additional follow up Details #1::        LMTOCB Pt needs to schedule a follow up appt before next refill. Additional Follow-up by: Romualdo Bolk, CMA Duncan Dull),  July 12, 2009 11:31 AM    Additional Follow-up for Phone Call Additional follow up Details #2::    LMTOCB Follow-up by: Romualdo Bolk, CMA Duncan Dull),  July 17, 2009 11:51 AM  Additional Follow-up for Phone Call Additional follow up Details #3:: Details for Additional Follow-up Action Taken: LMTOCB Romualdo Bolk, CMA (AAMA)  July 19, 2009 3:15 PM Pt never returned my call.  Romualdo Bolk, CMA Duncan Dull)  July 25, 2009 12:59 PM  Send a letter to have her schedule an appt.  Additional Follow-up by: Madelin Headings MD,  July 25, 2009 5:31 PM  Prescriptions: NORVASC 5 MG  TABS (AMLODIPINE BESYLATE) one tab once daily  #90 x 0   Entered by:   Romualdo Bolk, CMA (AAMA)   Authorized by:   Madelin Headings MD   Signed by:   Romualdo Bolk, CMA (AAMA) on 07/07/2009   Method used:   Electronically to        [facility] [address] (mail-order)       [facility]       [address]       [place], [place]  [address]       Ph: [phone]       Fax: [phone]   RxID:   [id number]    Letter sent to pt. Romualdo Bolk, CMA Duncan Dull)  July 28, 2009 5:10 PM

## 2010-06-05 NOTE — Progress Notes (Signed)
Summary: Heart cramping on bp meds  Phone Note Call from Patient Call back at [phone]   Caller: Patient Summary of Call: Pt states that she is having cramping in her heart just in am but doesn't notice it any other time. She states that it last for a few hours then it will stop after she gets going. This just started when she was started when started the bp medication. Pt has an appt for ECHO. Initial call taken by: Romualdo Bolk, CMA Duncan Dull),  November 27, 2009 4:38 PM  Follow-up for Phone Call        Per Dr. Fabian Sharp- can d/c bp meds for now but don't throw them out. See Cardiology after Echo. Order sent to [facility]. Follow-up by: Romualdo Bolk, CMA Duncan Dull),  November 27, 2009 4:59 PM  Additional Follow-up for Phone Call Additional follow up Details #1::        reason for consult  ..  hypertension and  hx of myltiple med  side effects (, ie  chest pain )and borderline cardiomegaly on x ray . Additional Follow-up by: Madelin Headings MD,  November 27, 2009 5:28 PM

## 2010-06-05 NOTE — Assessment & Plan Note (Signed)
Summary: COUGH//CCM   Vital Signs:  Patient profile:   50 year old female Menstrual status:  hysterectomy Height:      67 inches Weight:      298 pounds BMI:     46.84 O2 Sat:      98 % on Room air Temp:     98.5 degrees F oral Pulse rate:   86 / minute BP sitting:   142 / 80  (left arm) Cuff size:   large  Vitals Entered By: Romualdo Bolk, CMA (AAMA) (June 09, 2009 11:21 AM)  O2 Flow:  Room air CC: Coughing, wheezing and sore throat. The sore throat started on 2/3 and coughing started 3 weeks ago.   Serial Vital Signs/Assessments:  Time      Position  BP       Pulse  Resp  Temp     By           R Arm     140/82                         Romualdo Bolk, CMA (AAMA)           L Arm     142/80                         Romualdo Bolk, CMA (AAMA)   History of Present Illness: [name] comesin today for  SDA because of 3 weeks of isolated cough motly when at work and not at night  and not resolving. Ocass tightness now better.  NOw over the last days she has had st and congestion without fever or increaing sob.  Unsure what to take for this.   no hx of asthma or ets. ? if has had wheezy bronchitis in the past.  Has no inhaler at home.  Many people at work are coughing.   HT : has not   come back after potassium check in August and was rec to go on spirololactone and then follow up potassium . She was scared to go on the med because  it could possibly cause tumors.   No explanation for why didnt follow up potassium.   Her bp readings she thinks have been in the 140 range.   Preventive Screening-Counseling & Management  Alcohol-Tobacco     Alcohol drinks/day: 0     Smoking Status: never  Caffeine-Diet-Exercise     Caffeine use/day: 1-2     Does Patient Exercise: yes     Type of exercise: walking     Times/week: 3  Current Medications (verified): 1)  Norvasc 5 Mg  Tabs (Amlodipine Besylate) .... One Tab Once Daily 2)  Aciphex 20 Mg Tbec (Rabeprazole  Sodium) .[name].. 1 By Mouth Once Daily 3)  Spironolactone 25 Mg Tabs (Spironolactone) .... 1/2 By Mouth Two Times A Day  Allergies (verified): 1)  ! * Atacand 2)  ! Motrin 3)  ! * Micardis 4)  ! Vito Berger  Past History:  Past medical, surgical, family and social histories (including risk factors) reviewed, and no changes noted (except as noted below).  Past Medical History: Reviewed history from 05/27/2008 and no changes required. Hypertension Morbid Obesity Renal Artery Stenosis, Mild  ? hemo insign   Allergic rhinitis Diabetes mellitus, type II? vs hyperglycemia   Hyperlipidemia Low back pain  Consults Pulm  [facility] in remote past / for sever HT and  had ? secondary eval  Past Surgical History: Reviewed history from 09/09/2008 and no changes required. Hysterectomy  partial for fibroids.  tubal ligation  Past History:  Care Management: Cardiology: [facility]-in past Gynecology: Jennette Kettle   Family History: Reviewed history from 07/07/2007 and no changes required. Family History Hypertension Fa died MVA Mom HT and cancer is deceased  Review of Systems       The patient complains of prolonged cough.  The patient denies anorexia, fever, weight loss, weight gain, vision loss, decreased hearing, hoarseness, syncope, dyspnea on exertion, hemoptysis, abdominal pain, melena, hematochezia, and severe indigestion/heartburn.         day cough  more at work.    Physical Exam  General:  Well-developed,well-nourished,in no acute distress; alert,appropriate and cooperative throughout examination well groomed  mild congestion Head:  normocephalic and atraumatic.   Eyes:  vision grossly intact and pupils equal.   Ears:  R ear normal, L ear normal, and no external deformities.   Nose:  no external deformity and no external erythema.  congested  face non tender  Mouth:  pharynx pink and moist.   low soft palate  Neck:  No deformities, masses, or tenderness noted. Lungs:  Normal respiratory  effort, chest expands symmetrically. Lungs are clear to auscultation, no crackles or wheezes. Heart:  Normal rate and regular rhythm. S1 and S2 normal without gallop, murmur, click, rub or other extra sounds. Pulses:  nl cap arefill  Extremities:  no cyanosis   or clubbing Neurologic:  non focal  Skin:  turgor normal, color normal, no ecchymoses, and no petechiae.   Cervical Nodes:  No lymphadenopathy noted Psych:  Oriented X3, not anxious appearing, and not depressed appearing.     Impression & Recommendations:  Problem # 1:  COUGH (ICD-786.2) over 2-3 weeks  ? allergic or elr type  but now seems to have viral URI in addition by exam and history.   needs follow up if continues   chest x ray etc.    for now observe and symptom rx inhaler if needed  Problem # 2:  HYPERTENSION (ICD-401.9) ? nottaking the spironolactone.      pmultiple meds tried and control has been problematic ..factors include se , patient weight and lifestyle and fear of side effect . In the past hasnt wanted other referrals or intervention by specialist.    Her updated medication list for this problem includes:    Norvasc 5 Mg Tabs (Amlodipine besylate) ..... One tab once daily    Spironolactone 25 Mg Tabs (Spironolactone) .[name]... 1/2 by mouth two times a day  Problem # 3:  HYPOKALEMIA (ICD-276.8) didnt come in for recheck.  ? if even taking the spironolactone.       Problem # 4:  MORBID OBESITY (ICD-278.01) Assessment: Comment Only contrbuting to ht difficulties   Problem # 5:  non adherance to treatment plan and .fu  ? why although doing better than  in the past .    follow up   tends to frequently  not happen  and she  does  not contact us until  sick or  other issue intervenes.   Complete Medication List: 1)  Norvasc 5 Mg Tabs (Amlodipine besylate) .... One tab once daily 2)  Aciphex 20 Mg Tbec (Rabeprazole sodium) .[name].. 1 by mouth once daily 3)  Spironolactone 25 Mg Tabs (Spironolactone) .... 1/2 by mouth two  times a day  Patient Instructions: 1)   Schedule  labs   BMP   ,  lipids , hg a1c  , TSH ,  and then plan follow up  2)  try either benadryl  or chlorpheiramine or xyrtec for poss  fdrainage as cause of cough. 3)  No decongestants but other robitussin ok. 4)  I think you have a viral infection  but could have other cause of cough. 5)  If your cough is not sig nificantly improved or resolved in another 2 weeks  we should get a chest x ray and return office visit . 6)  Call if fever or other alarm signs

## 2010-06-05 NOTE — Progress Notes (Signed)
Summary: please leave a message  Phone Note Call from Patient Call back at [phone]   Caller: Patient---live call Summary of Call: Returning a call about lab results. please leave a detailed message. Initial call taken by: Warnell Forester,  November 20, 2009 8:38 AM  Follow-up for Phone Call        Left message on machine about results. See cxr results. Follow-up by: Romualdo Bolk, CMA (AAMA),  November 20, 2009 3:29 PM

## 2010-06-05 NOTE — Progress Notes (Signed)
Summary: has some chest pain/high bp   Phone Note Call from Patient   Caller: Patient [phone] Reason for Call: Talk to Nurse Summary of Call: pt had spike in bp, chest cramping, r side feels funny-yesterday-just after eating salty food, thinks maybe diabetic, also thinks she has nerve damage on her r side, so she thinks these symptoms maybe related to these things, however she wanted to see what we think Initial call taken by: Glynda Jaeger,  March 02, 2010 8:40 AM  Follow-up for Phone Call        [name] calls today b/c she had pain and numbness in her right side and leg last pm.  She still has some numbness in her toes this morning.  She is feeling better with the amlodipine but does not know what her bp is this am.  She is worried she may be a diabetic.  I told her she should keep her appt with her pcp to discuss this with her.  She cancelled her pcp appt for today.  She is awaiting call from pcp for endocrinologist referral. Mylo Red RN

## 2010-06-05 NOTE — Progress Notes (Signed)
Summary: Referral to Endocrine  Phone Note Call from Patient Call back at [phone]   Caller: Patient Summary of Call: Pt is still have lf foot pain x 8 months.  2 toes feel numb on rt foot and rt arm feels numb and this has been going on for 2 years from the angiogram. Pt is concerned about this being related to Diabetes. Pt did go to [facility] and see them. I told pt to call [facility] about foot pain. Pt is now having a frequent urination every night. Pt is going 12 times a night. Pt would like a referral Endocrine.  Initial call taken by: Romualdo Bolk, CMA Duncan Dull),  March 02, 2010 9:03 AM  Follow-up for Phone Call        ok to refer.   to endocrinology . ( I last saw her in June and  disc metformin  and rec   follow up labs  to include hg a1c that for whatever reason did not happen.   )She has followed through  with cardiology fortunately and planning OSA eval as rec .    i agree with seeing her ortho about foot  Pain  . [name]  Follow-up by: Madelin Headings MD,  March 02, 2010 11:58 AM  Additional Follow-up for Phone Call Additional follow up Details #1::        Order sent to [facility] and left message for pt to call back. Additional Follow-up by: Romualdo Bolk, CMA Duncan Dull),  March 02, 2010 12:45 PM    Additional Follow-up for Phone Call Additional follow up Details #2::    [facility] Romualdo Bolk, CMA Duncan Dull)  March 06, 2010 9:52 AM [facility] Romualdo Bolk, CMA Duncan Dull)  March 06, 2010 9:53 AM Pt aware of this. Follow-up by: Romualdo Bolk, CMA (AAMA),  March 08, 2010 10:42 AM

## 2010-06-05 NOTE — Assessment & Plan Note (Signed)
Summary: F/U ON COUGH, CONGESTION // RS   Vital Signs:  Patient profile:   50 year old female Menstrual status:  hysterectomy Weight:      295 pounds O2 Sat:      98 % on Room air Temp:     98.3 degrees F oral Pulse rate:   81 / minute BP sitting:   150 / 90  (left arm) Cuff size:   large  Vitals Entered By: Romualdo Bolk, CMA (AAMA) (November 17, 2009 10:46 AM)  O2 Flow:  Room air CC: Coughing x 6 weeks- Inhaler didn't help, robitussin helps some but doesn't stop it. Pt hasn't started symbicort yet.   History of Present Illness: [name]  comesin today  for sda  as she is not better from last visit . tried 2 weeks of symbicort and no helps.   no fever    but continue post nasal  nasal drainage    and cough is  worse at work.       yellowish  phelgm  spits it out  thinks she wheezes in midchest    BP:   not taking   new med for  for more than 3 days.  wants to start but before coming in hard to get off of work otherwise.  No change in other  medical issues  hasnt seen pulm for her  poss osa.     Preventive Screening-Counseling & Management  Alcohol-Tobacco     Alcohol drinks/day: 0     Smoking Status: never  Caffeine-Diet-Exercise     Caffeine use/day: 1-2     Does Patient Exercise: yes     Type of exercise: walking     Times/week: 3  Current Medications (verified): 1)  Norvasc 5 Mg  Tabs (Amlodipine Besylate) .... One Tab Once Daily 2)  Symbicort 160-4.5 Mcg/act Aero (Budesonide-Formoterol Fumarate) .... 2 Puffs Two Times A Day 3)  Edarbi 80mg  .... 1 By Mouth Once Daily  Allergies (verified): 1)  ! * Atacand 2)  ! Motrin 3)  ! * Micardis 4)  ! Vito Berger  Past History:  Past medical, surgical, family and social histories (including risk factors) reviewed, and no changes noted (except as noted below).  Past Medical History: Hypertension Morbid Obesity Renal Artery Stenosis, Mild  ? hemo insign   Allergic rhinitis Diabetes mellitus, type II? vs  hyperglycemia   Hyperlipidemia Low back pain  Consults Pulm  [facility] in remote past / for severe HT and had ? secondary eval  Past Surgical History: Reviewed history from 09/09/2008 and no changes required. Hysterectomy  partial for fibroids.  tubal ligation  Past History:  Care Management: Cardiology: [facility]-in past Gynecology: Jennette Kettle  Orthopedics: Tomasita Crumble  Family History: Reviewed history from 07/07/2007 and no changes required. Family History Hypertension Fa died MVA Mom HT and cancer is deceased  Social History: Reviewed history from 09/15/2009 and no changes required. Never Smoked Married Alcohol use-no  Drug use-no Regular exercise-yes HH of 4 no pets  works with [employer]   less sugar drinks   and water   Review of Systems       The patient complains of dyspnea on exertion, peripheral edema, and prolonged cough.  The patient denies anorexia, fever, weight loss, vision loss, abdominal pain, transient blindness, difficulty walking, and angioedema.    Physical Exam  General:  alert, well-developed, and well-nourished.   in no acute distress . congested  Head:  normocephalic and atraumatic.  Eyes:  vision grossly intact, pupils equal, and pupils round.   Ears:  R ear normal, L ear normal, and no external deformities.   Nose:  no external deformity and no external erythema.  congested  face nontedenr Mouth:  pharynx pink and moist.   no lesions Neck:  No deformities, masses, or tenderness noted. Lungs:  Normal respiratory effort, chest expands symmetrically. Lungs are clear to auscultation, no crackles or wheezes.no dullness.   Heart:  Normal rate and regular rhythm. S1 and S2 normal without gallop, murmur, click, rub or other extra sounds. Pulses:  pulses intact without delay   Extremities:  no cyanosis   or clubbing1+ left pedal edema and 1+ right pedal edema.   Neurologic:  non focal  Skin:  no ecchymoses and no petechiae.   Cervical Nodes:   No lymphadenopathy noted Psych:  Oriented X3, normally interactive, good eye contact, not anxious appearing, and not depressed appearing.     Impression & Recommendations:  Problem # 1:  COUGH (ICD-786.2) continued ? secondary  bacterial or sinusitis with   allergic     get x ray and will add antibiotic  and pred depending on result Orders: T-2 View CXR (71020TC)  Problem # 2:  HYPERTENSION (ICD-401.9)  hasnt really taken adarbi yet except for3 days.    MOre samples given .  as in past very cautious and has se of lots of meds  ... couldnt take other arbs but not cause of  angioedema or allergic reaction  more intolerances.  fatigue etc.  Her updated medication list for this problem includes:    Norvasc 5 Mg Tabs (Amlodipine besylate) ..... One tab once daily  BP today: 150/90 Prior BP: 170/100 (10/13/2009)  Prior 10 Yr Risk Heart Disease: 7 % (09/15/2009)  Labs Reviewed: K+: 3.6 (09/08/2009) Creat: : 0.8 (09/08/2009)   Chol: 184 (09/08/2009)   HDL: 48.30 (09/08/2009)   LDL: 115 (09/08/2009)   TG: 105.0 (09/08/2009)  Problem # 3:  FOOT PAIN, LEFT (ICD-729.5) after patient left requested  x ray for pain  .   verbal order given  Problem # 4:  ? of SLEEP APNEA (ICD-780.57) still need s eval  cancelled last appt.  pt says work schedule is tight.     many excuses over the years to not have an updated eval for this .    Problem # 5:  MORBID OBESITY (ICD-278.01) Assessment: Comment Only  Ht: 67 (06/09/2009)   Wt: 295 (11/17/2009)   BMI: 46.84 (06/09/2009)  Complete Medication List: 1)  Norvasc 5 Mg Tabs (Amlodipine besylate) .... One tab once daily 2)  Symbicort 160-4.5 Mcg/act Aero (Budesonide-formoterol fumarate) .... 2 puffs two times a day 3)  Edarbi 80mg   .... 1 by mouth once daily 4)  Zithromax 1 Gm Pack (Azithromycin) .... Take as directed 5)  Prednisone 20 Mg Tabs (Prednisone) .... 2 by mouth once daily for 5 days  Patient Instructions: 1)  Get chest x ray  today  2)  if  ok will begin antibioitc and prednisone  as directed  3)  expect  imrpovement either way in 5 days.  call  in  a week  if not better .  pt cell  [phone]  see x ray   shows poss CM   will get echo  . WP.

## 2010-06-05 NOTE — Assessment & Plan Note (Signed)
Summary: COUGH, CONGESTION // RS   Vital Signs:  Patient profile:   50 year old female Menstrual status:  hysterectomy Weight:      296 pounds O2 Sat:      98 % on Room air Temp:     98.3 degrees F oral Pulse rate:   78 / minute BP sitting:   170 / 100  (left arm) Cuff size:   large  Vitals Entered By: Romualdo Bolk, CMA (AAMA) (October 13, 2009 8:58 AM)  O2 Flow:  Room air  Serial Vital Signs/Assessments:  Time      Position  BP       Pulse  Resp  Temp     By           R Arm     170/80                         Madelin Headings MD           L Arm     168/88                         Madelin Headings MD  Comments: large cuff  sitting  By: Madelin Headings MD   CC: Coughing, congestion and sob this started on 6/4   History of Present Illness: [name] comes in today  for SDA for above . ONset   about  6-7 days   cough and wheezing at night     No phlegm  .  no fever .  Tried Delsym. some help.   no asthma but ocass bronchitis.   no fever or cp .   BP  :   3 days ago    160/ 84   at work  only on norvasc cause of se of so many meds  but realizes needs to treat  and lose weight. no missed doses of meds  Bp had been in the 140 range  Has nocturia and sleep depribation  has appt with urology and not with sleep yet.    Preventive Screening-Counseling & Management  Alcohol-Tobacco     Alcohol drinks/day: 0     Smoking Status: never  Caffeine-Diet-Exercise     Caffeine use/day: 1-2     Does Patient Exercise: yes     Type of exercise: walking     Times/week: 3  Current Medications (verified): 1)  Norvasc 5 Mg  Tabs (Amlodipine Besylate) .... One Tab Once Daily  Allergies (verified): 1)  ! * Atacand 2)  ! Motrin 3)  ! * Micardis 4)  ! Vito Berger  Past History:  Past medical, surgical, family and social histories (including risk factors) reviewed, and no changes noted (except as noted below).  Past Medical History: Reviewed history from 05/27/2008 and no  changes required. Hypertension Morbid Obesity Renal Artery Stenosis, Mild  ? hemo insign   Allergic rhinitis Diabetes mellitus, type II? vs hyperglycemia   Hyperlipidemia Low back pain  Consults Pulm  [facility] in remote past / for sever HT and had ? secondary eval  Past Surgical History: Reviewed history from 09/09/2008 and no changes required. Hysterectomy  partial for fibroids.  tubal ligation  Past History:  Care Management: Cardiology: [facility]-in past Gynecology: Jennette Kettle  Orthopedics: Tomasita Crumble  Family History: Reviewed history from 07/07/2007 and no changes required. Family History Hypertension Fa died MVA Mom HT and cancer is deceased  Social History: Reviewed history from 09/15/2009 and no changes required. Never Smoked Married Alcohol use-no  Drug use-no Regular exercise-yes HH of 4 no pets  works with [employer]   less sugar drinks   and water   Review of Systems  The patient denies anorexia, fever, weight loss, weight gain, chest pain, syncope, prolonged cough, headaches, abdominal pain, enlarged lymph nodes, and angioedema.    Physical Exam  General:  alert, well-developed, well-nourished, and well-hydrated.   Head:  normocephalic and atraumatic.   Eyes:  vision grossly intact, pupils equal, pupils round, and pupils reactive to light.   Ears:  R ear normal, L ear normal, and no external deformities.   Nose:  mininal congestion  Mouth:  pharynx pink and moist.  low palate Neck:  No deformities, masses, or tenderness noted. Lungs:  Normal respiratory effort, chest expands symmetrically. Lungs are clear to auscultation, no crackles or wheezes.no dullness.   Heart:  Normal rate and regular rhythm. S1 and S2 normal without gallop, murmur, click, rub or other extra sounds. Pulses:  pulses intact without delay   Extremities:  no cyanosis   or clubbing1+ left pedal edema and 1+ right pedal edema.   Neurologic:  non focal  Skin:  turgor normal,  color normal, no ecchymoses, and no petechiae.   Cervical Nodes:  No lymphadenopathy noted Psych:  Oriented X3, good eye contact, not anxious appearing, and not depressed appearing.     Impression & Recommendations:  Problem # 1:  COUGH (ICD-786.2) poss asthmatic   reactive airway vs viral rti   exam nl  today  does have allergies and have had code orange air alerts for the past several days.  Problem # 2:  HYPERTENSION (ICD-401.9) now out of control   for days .  hesitant to use any meds cause of sedation se.       will add  samples of edarbi 80 in the meantime .  would consider bystolic but because of ? of wheezing would wait on this .   her se in the past  have not been allergic in nature so will try this arb today.  Her updated medication list for this problem includes:    Norvasc 5 Mg Tabs (Amlodipine besylate) ..... One tab once daily  Problem # 3:  ? of SLEEP APNEA (ICD-780.57) no appt yet  will od re referral      could be affecting allof above  Orders: Pulmonary Referral (Pulmonary)  Complete Medication List: 1)  Norvasc 5 Mg Tabs (Amlodipine besylate) .... One tab once daily 2)  Symbicort 160-4.5 Mcg/act Aero (Budesonide-formoterol fumarate) .... 2 puffs two times a day 3)  Edarbi 80 Mg  .Marland KitchenMarland Kitchen. 1 by mouth once daily  Patient Instructions: 1)  will refer for sleep apnea evaluation 2)  use inhaler  for the next 2 weeks call if fever or worsening. 3)  avoid  out side time with poor air quality. 4)  your  blood pressure is very high and you need   sleep  apnea evaluation   and  other meds for your BP control. 5)  try addng new medication every day for the next 2 weeks and then ROV    2 weeks  6)   continue the amlodipine in the meantime.   Prevention & Chronic Care Immunizations   Influenza vaccine: Not documented    Tetanus booster: 08/30/1996: Td    Pneumococcal vaccine: Not documented  Other Screening   Pap smear: Not  documented    Mammogram: Not  documented   Smoking status: never  (10/13/2009)  Lipids   Total Cholesterol: 184  (09/08/2009)   LDL: 115  (09/08/2009)   LDL Direct: Not documented   HDL: 48.30  (09/08/2009)   Triglycerides: 105.0  (09/08/2009)    SGOT (AST): 22  (05/27/2008)   SGPT (ALT): 20  (05/27/2008)   Alkaline phosphatase: 58  (05/27/2008)   Total bilirubin: 0.7  (05/27/2008)  Hypertension   Last Blood Pressure: 170 / 100  (10/13/2009)   Serum creatinine: 0.8  (09/08/2009)   Serum potassium 3.6  (09/08/2009)  Self-Management Support :    Hypertension self-management support: Not documented    Lipid self-management support: Not documented

## 2010-06-05 NOTE — Letter (Signed)
Summary: [facility] Update   [facility] Update   Imported By: Roderic Ovens 01/31/2010 14:21:03  _____________________________________________________________________  External Attachment:    Type:   Image     Comment:   External Document  Appended Document: [facility] Update  would see if she wants to reschedule sleep study.   Appended Document: [facility] Update  LMTCB   Appended Document: [facility] Update  LMTCB  Appended Document: [facility] Update  LMTCB   Appended Document: [facility] Update  I talked with pt --she stated she would be interested in rescheduling sleep study--she wanted to call herself to reschedule--I gave her the phone # for sleep center

## 2010-06-05 NOTE — Letter (Signed)
Summary: Generic Letter  [facility]  [address] [place], [place] [address]   Phone: [phone]  Fax: [phone]    07/28/2009  Karlene Einstein [address] [place], [place]  [address]  Dear Ms. BUFFALO,   We have been trying to call you to let you know that you are due for a follow up appt. with Dr. Fabian Sharp. Please call our office at [phone] to schedule this appt. before your next refill on your medications.        Sincerely,   Tor Netters, CMA (AAMA)

## 2010-06-05 NOTE — Miscellaneous (Signed)
Summary: Orders Update  Clinical Lists Changes  Problems: Added new problem of FOOT PAIN, LEFT (ICD-729.5) Orders: Added new Test order of T-Foot Left Min 3 Views (73630TC) - Signed 

## 2010-06-05 NOTE — Letter (Signed)
Summary: Therapist, occupational Services   Imported By: Marylou Mccoy 01/01/2010 10:38:03  _____________________________________________________________________  External Attachment:    Type:   Image     Comment:   External Document

## 2010-06-05 NOTE — Progress Notes (Signed)
Summary: REFILL REQUEST (Norvasc)  Phone Note Refill Request Message from:  Patient on Sep 27, 2009 12:20 PM  Refills Requested: Medication #1:  NORVASC 5 MG  TABS one tab once daily.   Notes: Please send through [facility].    Initial call taken by: Debbra Riding,  Sep 27, 2009 12:21 PM  Follow-up for Phone Call        Rx sent to phramacy Follow-up by: Romualdo Bolk, CMA Duncan Dull),  Sep 27, 2009 1:39 PM    Prescriptions: NORVASC 5 MG  TABS (AMLODIPINE BESYLATE) one tab once daily  #90 x 0   Entered by:   Romualdo Bolk, CMA (AAMA)   Authorized by:   Madelin Headings MD   Signed by:   Romualdo Bolk, CMA (AAMA) on 09/27/2009   Method used:   Electronically to        [facility] [address] (mail-order)       [facility]       [address]       [place], [place]  [address]       Ph: [phone]       Fax: [phone]   RxID:   [id number]

## 2010-06-05 NOTE — Assessment & Plan Note (Signed)
Summary: MED CHECK/BP CHECK/LAB RESULTS/CJR   Vital Signs:  Patient profile:   50 year old female Menstrual status:  hysterectomy Weight:      297 pounds Pulse rate:   88 / minute BP sitting:   140 / 84  (left arm) Cuff size:   large  Vitals Entered By: Romualdo Bolk, CMA (AAMA) (Sep 15, 2009 8:35 AM) CC: Follow-up visit on labs and bp, Hypertension Management, Pt states that she is waking up every 1 to 1.5 hours to urinate at night. She is going all night and not during the day. Pt has plantar faciaitis in left foot   History of Present Illness: [name] comesin for follow up of multiple medical problems   HT  140/80 only now on norvasc  GERD: better.  using otc as needed med  Sleep:    interrupted with frequent nocturis a  change in stream of urin e ? if prolapse.  this is very bothersom to her  Her last gyne check dr Jennette Kettle last year.    Hasnt gone back for sleep study that was recommended.   always tired Obesity : not really exercising and in no program   aware of needing to lose weight.     Hypertension History:      She complains of peripheral edema, neurologic problems, and side effects from treatment, but denies headache, chest pain, palpitations, dyspnea with exertion, orthopnea, PND, visual symptoms, and syncope.  She notes the following problems with antihypertensive medication side effects: ankles swelling, inflamation from the nerve from setting- seeing [facility], dry mouth.        Positive major cardiovascular risk factors include hyperlipidemia and hypertension.  Negative major cardiovascular risk factors include female age less than 9 years old and non-tobacco-user status.        Positive history for target organ damage include peripheral vascular disease.     Preventive Screening-Counseling & Management  Alcohol-Tobacco     Alcohol drinks/day: 0     Smoking Status: never  Caffeine-Diet-Exercise     Caffeine use/day: 1-2     Does Patient  Exercise: yes     Type of exercise: walking     Times/week: 3  Current Medications (verified): 1)  Norvasc 5 Mg  Tabs (Amlodipine Besylate) .... One Tab Once Daily 2)  Aciphex 20 Mg Tbec (Rabeprazole Sodium) .[name].. 1 By Mouth Once Daily  Allergies (verified): 1)  ! * Atacand 2)  ! Motrin 3)  ! * Micardis 4)  ! Vito Berger  Past History:  Past medical, surgical, family and social histories (including risk factors) reviewed, and no changes noted (except as noted below).  Past Medical History: Reviewed history from 05/27/2008 and no changes required. Hypertension Morbid Obesity Renal Artery Stenosis, Mild  ? hemo insign   Allergic rhinitis Diabetes mellitus, type II? vs hyperglycemia   Hyperlipidemia Low back pain  Consults Pulm  [facility] in remote past / for sever HT and had ? secondary eval  Past Surgical History: Reviewed history from 09/09/2008 and no changes required. Hysterectomy  partial for fibroids.  tubal ligation  Past History:  Care Management: Cardiology: [facility]-in past Gynecology: Jennette Kettle  Orthopedics: Tomasita Crumble  Family History: Reviewed history from 07/07/2007 and no changes required. Family History Hypertension Fa died MVA Mom HT and cancer is deceased  Social History: Reviewed history from 05/27/2008 and no changes required. Never Smoked Married Alcohol use-no  Drug use-no Regular exercise-yes HH of 4 no pets  works with [employer]  Insurance   less sugar drinks   and water   Review of Systems  The patient denies anorexia, fever, weight loss, decreased hearing, hoarseness, chest pain, syncope, peripheral edema, prolonged cough, abdominal pain, melena, hematochezia, hematuria, genital sores, muscle weakness, suspicious skin lesions, difficulty walking, depression, unusual weight change, abnormal bleeding, enlarged lymph nodes, angioedema, and breast masses.         plantar fasciitis  and    irritated   nerve  in foot   fatigue and sleepiness    no numbness  Physical Exam  General:  Well-developed,well-nourished,in no acute distress; alert,appropriate and cooperative throughout examination looks tired and sleepy Eyes:  vision grossly intact and pupils equal.   Ears:  R ear normal and L ear normal.   Nose:  no external deformity and no external erythema.   Neck:  No deformities, masses, or tenderness noted. Lungs:  Normal respiratory effort, chest expands symmetrically. Lungs are clear to auscultation, no crackles or wheezes. Heart:  Normal rate and regular rhythm. S1 and S2 normal without gallop, murmur, click, rub or other extra sounds.no lifts.   Abdomen:  Bowel sounds positive,abdomen soft and non-tender without masses, organomegaly or  noted. Pulses:  pulses intact without delay   Extremities:  no cyanosis   or clubbing1+ left pedal edema and 1+ right pedal edema.   Neurologic:  alert & oriented X3, strength normal in all extremities, and gait normal.   Skin:  turgor normal, color normal, no ecchymoses, and no petechiae.   Cervical Nodes:  No lymphadenopathy noted   Impression & Recommendations:  Problem # 1:  HYPERTENSION (ICD-401.9)  better but still not at goal   potassium now ok  but off diuretics all together The following medications were removed from the medication list:    Spironolactone 25 Mg Tabs (Spironolactone) .[name]... 1/2 by mouth two times a day Her updated medication list for this problem includes:    Norvasc 5 Mg Tabs (Amlodipine besylate) ..... One tab once daily  BP today: 140/84 Prior BP: 142/80 (06/09/2009)  10 Yr Risk Heart Disease: 7 %  Labs Reviewed: K+: 3.6 (09/08/2009) Creat: : 0.8 (09/08/2009)   Chol: 184 (09/08/2009)   HDL: 48.30 (09/08/2009)   LDL: 115 (09/08/2009)   TG: 105.0 (09/08/2009)  Problem # 2:  NOCTURIA (ICD-788.43)  frequent wakeningas and change in stream adding to sleep deprivation  Orders: Urology Referral (Urology)  Problem # 3:  ? OSA  need follow up for    certaoinly can  add to   her fatigue and ht and  barrier to losing weight  Problem # 4:  GERD PROBABLY NOCTURNAL (ICD-530.81) Assessment: Improved with lifestyle changes  using as needed med   weight loss  and   ls changes helps The following medications were removed from the medication list:    Aciphex 20 Mg Tbec (Rabeprazole sodium) .[name]... 1 by mouth once daily  Problem # 5:  HYPERGLYCEMIA (ICD-790.29) hg a1c 6.6  she is prediabetic or early diabetic   counseled  . lifestyle intervention with weight loss  and exercise . can add med such as metofrm if needed     Labs Reviewed: Creat: 0.8 (09/08/2009)     Complete Medication List: 1)  Norvasc 5 Mg Tabs (Amlodipine besylate) .... One tab once daily  Hypertension Assessment/Plan:      The patient's hypertensive risk group is category C: Target organ damage and/or diabetes.  Her calculated 10 year risk of coronary heart disease is 7 %.  Today's blood pressure is 140/84.    Patient Instructions: 1)  Will do urology referral.  2)  See the Sleep  specialist for sleep apnea   evlaluation 3)  Weight loss  will help Blood pressure  . blood sugar,  sleep,  and plantar fasciitis. 4)  Inadequate sleep  adds to  fatigue and weight gain. 5)  Continue BP medication .     6)  Please schedule a follow-up appointment in 4 months .  7)  HgBA1c prior to visit  ICD-9:   2790. 8)  BMP  401.9   greater than 50% of visit spent in counseling  25 minutes

## 2010-06-05 NOTE — Op Note (Signed)
Summary: [facility]  [facility]   Imported By: Sherian Rein 10/20/2009 09:56:45  _____________________________________________________________________  External Attachment:    Type:   Image     Comment:   External Document

## 2010-06-07 NOTE — Consult Note (Signed)
Summary: [facility]   Imported By: Maryln Gottron 06/01/2010 09:33:35  _____________________________________________________________________  External Attachment:    Type:   Image     Comment:   External Document

## 2010-06-13 NOTE — Progress Notes (Signed)
Summary: change in meds   Phone Note Call from Patient Call back at cell [phone]   Caller: Patient Reason for Call: Refill Medication, Talk to Nurse Summary of Call: pt has reaction to NORVASC 10 MG TABS pt wants to know if she can be change back to 5mg . To please call it into [phone] Initial call taken by: Roe Coombs,  May 25, 2010 11:56 AM  Follow-up for Phone Call        pt to call me back to discuss decreasing Norvasc dose Luana Shu --I attempted to contact pt to discuss Norvasc dose --I left a message for pt to call me back

## 2010-09-13 ENCOUNTER — Encounter: Payer: Self-pay | Admitting: Internal Medicine

## 2010-09-13 ENCOUNTER — Ambulatory Visit (INDEPENDENT_AMBULATORY_CARE_PROVIDER_SITE_OTHER): Payer: Managed Care, Other (non HMO) | Admitting: Internal Medicine

## 2010-09-13 VITALS — BP 140/100 | HR 88 | Temp 98.3°F | Wt 307.0 lb

## 2010-09-13 DIAGNOSIS — R351 Nocturia: Secondary | ICD-10-CM

## 2010-09-13 DIAGNOSIS — I10 Essential (primary) hypertension: Secondary | ICD-10-CM

## 2010-09-13 DIAGNOSIS — R600 Localized edema: Secondary | ICD-10-CM

## 2010-09-13 DIAGNOSIS — R109 Unspecified abdominal pain: Secondary | ICD-10-CM

## 2010-09-13 DIAGNOSIS — R350 Frequency of micturition: Secondary | ICD-10-CM

## 2010-09-13 DIAGNOSIS — R609 Edema, unspecified: Secondary | ICD-10-CM

## 2010-09-13 LAB — POCT URINALYSIS DIPSTICK
Nitrite, UA: NEGATIVE
pH, UA: 6.5

## 2010-09-13 MED ORDER — NITROFURANTOIN MONOHYD MACRO 100 MG PO CAPS: 100.0000 mg | ORAL_CAPSULE | Freq: Two times a day (BID) | ORAL | Status: AC

## 2010-09-13 NOTE — Progress Notes (Signed)
  Subjective:    Patient ID: [name], female    DOB: August 11, 1960, 50 y.o.   MRN: [id number]  HPI Comes in for acute work in  appt day listed as UTi  But she really has worsening of her nocturia and  frequency of urination and  bladder  Issues.   No real pain or  Fever or hematuria. No CVA  pain .    About 4 months ago saw Urologist Dr Brunilda Payor who didn't think she had an anatomic abnormality but did have a UTI then and rx with macrobid. With some help but her nocturia continues.She says that ever since th hysterectomy her uronary stream has changed and is All over the place "   Urinary sx ar not bad in day but terrible at night .  Bp   Increased  140/84  Range.   Saw ards and in higher dose of Norvasc     Se none but swelling.   Did have some tears in skin but currently no ulcers or infection  Hasnt follow up wth sleep apnea specialisrs  cause she thinks the urinary poblme is  Causing sleep disturbance and making it hard to lose weight.  Review of Systems  No fever weight loss vision change cough  Or other abd pain. She feels tired all the time....    See above  Rest of rso neg or [place]       Objective:   Physical Exam WdWN in nad  Looks tired  Chest:  Clear to A&P without wheezes rales or rhonchi CV:  S1-S2 no gallops or murmurs peripheral perfusion is normal Extr 1-2 + edema  Neuro grossly normal Oriented x 3 and no noted deficits in memory, attention, and speech.      ua trc wbc and blood   Assessment & Plan:  Increase frequency of nocturia and  Very  Adverse to sleep.\ R/O interim infection Patient describes  Change in urinary stream   Has had no special testing  For this   . rec rx infection an  Consider  Refer to for urology  For more evaluation.   Ht  Always a problem but some better Edema prob from meds and weight      Se of meds are an issue  See  Adverse med list.  ? OSA says  Nocturia interfering with sleep and more concerned about this. Told her that sometimes OSA  will act like frequent nocturia also.  To see cards every 6 months for now.   Total visit > 50% spent counseling and coordinating care

## 2010-09-13 NOTE — Patient Instructions (Signed)
Will notify you of  Urine culture test an plan on re ref eval for urology.  Swelling could be from the norvasc and weight as we discussed  Diuretic may not work and will add to the bladder issues. You can contact Dr Jearld Pies offic ans see what advice they have about the swelling .

## 2010-09-15 LAB — URINE CULTURE: Colony Count: 25000

## 2010-09-18 ENCOUNTER — Telehealth: Payer: Self-pay | Admitting: *Deleted

## 2010-09-18 DIAGNOSIS — R350 Frequency of micturition: Secondary | ICD-10-CM

## 2010-09-18 NOTE — Telephone Encounter (Signed)
Message copied by Tor Netters on Tue Sep 18, 2010 10:44 AM ------      Message from: [facility] [place] [facility]      Created: Sun Sep 16, 2010  4:52 PM       Tell patient  Some mixed bacteria in her urine but not consistent with  Infection.  Plan urology re referral as we discussed             Ok to take the antibiotic anyway incase this is helping. Send copy of note and cx to urology

## 2010-09-18 NOTE — Assessment & Plan Note (Signed)
[facility] OFFICE NOTE   NAME:Park, [name]                  MRN:          [id number]  DATE:09/24/2006                            DOB:          1960/06/28    CHIEF COMPLAINT:  To get established, needs prescription of Lasix, is on  Norvasc.   HISTORY OF PRESENT ILLNESS:  [name] is a 50 year old non-smoking  married African American female who comes in today for a first time  visit. She states that her previous doctor was Dr. Efrain Sella, but I do  not know when she was seen last because we do not have access to those  records. She has also seen Dr. Antoine Poche and Dr. Samule Ohm in the last year  for what sounds like resistant hypertension and history of side effects  to multiple medicines for hypertension. The most problematic issue is  her hypertension that sometimes is quite high with readings in the 160s  and 180s. She has been tried on multiple medicines and apparently has  had some side effects. She is currently on Norvasc 2.5 mg which causes  leg edema and she takes Lasix here and there up to 8 days a month for  p.r.n. leg swelling. She is also on Labetalol 200 mg b.i.d. and  apparently was supposed to be on a higher dose, but does not feel she  can tolerate that. She apparently has had what sounds like a CT  angiogram and an evaluation for secondary hypertension, although she has  not had a workup for sleep apnea. She does have some constant fatigue,  difficulty sleeping, occasionally wakes up not being to breath and  according to her daughter she does snore, although there is no family  history of sleep apnea. She sounds like she may be the largest sized  person in her family. She was hospitalized or evaluated in October 2007  for shortness of breath and fluid in her lungs. At that time her blood  pressure was in the 160/90 range. Again, I do not have those records  today.   PAST MEDICAL HISTORY:   See database. History of anemia, headaches, high  blood pressure, migraines, partial hysterectomy 2006 for bleeding and  fibroids, she has had two live births. Her last pap smear was 2006, L  and P 2006. Her last tetanus shot was greater than 10 years ago.   FAMILY HISTORY:  Father died in an MVA when she was young. Mother passed  away three years ago due to high blood pressure and had some underlying  cancer. There is a positive family history for hypertension.   SOCIAL HISTORY:  Household of 4. Lives with her husband and children. No  pets. Negative TAD. She drinks two Pepsis a day a the most. Is trying to  exercise and walk at times. She tends to eat lunch at her desk and eat  out. 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   REVIEW OF SYSTEMS:  Negative for chest pain and shortness of breath  except as per the history  of present illness. GI and GU negative except  as above. Edema as above.   MEDICATIONS:  1. Norvasc 2.5 mg daily.  2. Labetalol 200 mg b.i.d.  3. Lasix 20 mg p.r.n.  4. Aspirin 81 mg.   MOTRIN causes hives. Intolerance to some blood pressure medications of  unknown name.   OBJECTIVE:  VITAL SIGNS:  Height 5 foot 7 inches, weight 292, pulse 72  and regular, blood pressure 120/80, 140/82.  GENERAL:  This a well developed, well nourished healthy appearing lady  in no acute distress. She is well groomed.  HEENT: Grossly normal.  NECK:  Supple without mass, thyromegaly or bruit.  CHEST:  Clear to auscultation. Breath is equal.  CARDIAC:  S1, S2, no gallops or murmurs. Peripheral pulses are present  without delay. There is +1 edema in the lower extremities. No cords or  redness.  ABDOMEN:  Soft, without organomegaly, rebound, or guarding.  NEUROLOGIC:  Appears grossly intact.  SKIN:  No acute changes.   IMPRESSION:  1. Hypertension, uncontrolled by history with history of significant      sensitivities to medications.  2. Morbid  obesity.  3. Fatigue, possible sleep apnea could be contributing to the above. I      spent a good deal of a time, up to 20 minutes, counseling her in      regards to lifestyle changes and techniques for which she could      lose weight, such as not eating out for a month, excluding calories      from beverages, wearing a pedometer, etcetera. We will refill her      Lasix today, but recommend that she may get more benefit from      taking it on a regular basis, if she will take three times a week,      although we must need to watch her potassium level. We should      consider in the future, also perhaps a sleep consultation, as she      does have some symptoms consistent with obstructive sleep apnea,      but she is well aware that weight loss is a part of the treatment      if she does have this. She will follow up in about a month. I will      try to get her records and see what we will have to appropriate      then.     Neta Mends. Panosh, MD  Electronically Signed    WKP/MedQ  DD: 09/25/2006  DT: 09/25/2006  Job #: [id number]

## 2010-09-18 NOTE — Telephone Encounter (Signed)
Left message to call back  

## 2010-09-20 NOTE — Telephone Encounter (Signed)
Left message to call back  

## 2010-09-21 NOTE — H&P (Signed)
NAME:  [name], [name] NO.:  [id number]   MEDICAL RECORD NO.:  [id number]          PATIENT TYPE:  AMB   LOCATION:  [facility]                           FACILITY:  [facility]   PHYSICIAN:  Freddy Finner, M.D.   DATE OF BIRTH:  [birth year]   DATE OF ADMISSION:  DATE OF DISCHARGE:                                HISTORY & PHYSICAL   DATE OF ADMISSION:  Sep 24, 2004   ADMITTING DIAGNOSES:  1.  Fibroids.  2.  Severe menorrhagia/dysmenorrhea.   The patient is a 50 year old black married female gravida 4 para 2 who has  previously had surgical sterilization in 1994. She has been known to have  menorrhagia and uterine enlargement for approximately 10 years. Over the  last couple of years this has become progressively more and more severe to  the point that it interferes with her daily life during menses with severe  pain and very heavy menses and passage of large clots and flooding  accidents. She is now admitted for laparoscopic-assisted vaginal  hysterectomy, possible bilateral salpingo-oophorectomy.   Her current review of systems is otherwise negative. No cardiopulmonary, GI,  or GU complaints.   Her only known significant past medical history is of hypertension. For this  she takes Norvasc and takes hydrochlorothiazide episodically. There are no  other known significant medical illnesses. Previous surgical procedures  include a tubal ligation and D&E in 1994. She has had wisdom teeth extracted  at the age of approximately 65. She had a therapeutic abortion with D&C in  1976. She has never had a blood transfusion. She has had two vaginal  deliveries. She has no known drug allergies, does not use alcohol or  cigarettes.   FAMILY HISTORY:  Noncontributory.   PHYSICAL EXAMINATION:  HEENT:  Grossly within normal limits. Blood pressure  in the office was 144/80. Thyroid gland is not palpably enlarged.  CHEST:  Clear to auscultation.  HEART:  Normal sinus rhythm without murmurs,  rubs, or gallops.  ABDOMEN:  Soft and nontender without appreciable organomegaly.  PELVIC:  The uterus is enlarged. There are no palpable adnexal masses but  the exam is compromised by the enlargement of the uterus. The rectum is  palpably normal. Rectovaginal exam confirms the above findings.  EXTREMITIES:  Without cyanosis, clubbing, or edema.   ASSESSMENT:  Uterine leiomyomata, no demonstrable intracavitary abnormality  on previous sonohysterogram approximately 18 months prior to this admission.   PLAN:  Laparoscopic-assisted vaginal hysterectomy.      WRN/MEDQ  D:  09/21/2004  T:  09/21/2004  Job:  [id number]

## 2010-09-21 NOTE — Assessment & Plan Note (Signed)
[facility]                            CARDIOLOGY OFFICE NOTE   NAME:[name], [name]                  MRN:          [id number]  DATE:08/05/2006                            DOB:          [birth year]    PRIMARY:  Dr. Jonny Ruiz.   REASON FOR PRESENTATION:  Evaluate patient with difficult to control  hypertension and leg numbness.   HISTORY OF PRESENT ILLNESS:  The patient is 50 years old and status post  angiogram to rule out fibromuscular dysplasia.  She has very difficult  to control hypertension that has been resistant to multiple medications.  She has been intolerant to several medications.  Since her  catheterization she has had lower extremity numbness.  This has been  bilateral.  Her legs have felt heavy and said at the time she feels like  they cannot support her.  She has had a rubber band sensation under  her right knee.  She also complains of increased exhaustion.  She feels  her legs fluttering.  She has had her heart pounding but does not  describe any symptomatic tachyarrhythmias.  There has been no presyncope  or syncope.  She has had abdominal swelling.  She has been very fatigued  and sleepy, particularly related to her medications.  She stopped taking  her clonidine twice a day and is only taking it at night.  She is quite  distressed and very stressed with this.  She also has stress at work.  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   She is not having any chest discomfort, neck discomfort, arm discomfort,  activity-induced nausea, vomiting, excessive diaphoresis.  She is having  no PND or orthopnea.   PAST MEDICAL HISTORY:  1. Hypertension.  2. Obesity.  3. Vaginal hysterectomy.   ALLERGIES INTOLERANCES:  1. ATACAND.  2. MOTRIN.  3. NORVASC.  4. MICARDIS.  5. TEKTURNA.   MEDICATIONS:  1. Spironolactone 25 mg daily.  2. Clonidine 0.1 mg q.h.s.  3. Labetalol 200 mg b.i.d.  4. Aspirin 81 mg  daily.  5. Lasix 20 mg p.r.n.   REVIEW OF SYSTEMS:  As stated in the HPI and otherwise negative for  other systems.   Blood pressure 197/100, heart rate 75 and regular, weight 294 pounds,  body mass index 46.  HEENT:  Eyelids unremarkable, pupils equal, round, react to light, fundi  not visualized, oral mucosa unremarkable.  NECK:  No jugular venous distention at 45 degrees, carotid upstroke  brisk and symmetric, no bruits, no thyromegaly.  LYMPHATICS:  No cervical, axillary, or inguinal adenopathy.  LUNGS:  Clear to auscultation bilaterally.  BACK:  No costovertebral angle tenderness.  CHEST:  Unremarkable.  HEART:  PMI not displaced or sustained, S1 and S2 within normal limits,  no S3, no S4, no clicks, no rubs, no murmurs.  ABDOMEN:  Morbidly obese,  positive bowl sounds normal in frequency and  pitch, no bruits, no rebound, no guarding, no midline pulses, no masses,  no hepatomegaly, no splenomegaly.  SKIN:  No rashes, no nodules.  EXTREMITIES:  Pulses 2+ throughout, no edema,  no cyanosis, no clubbing.  NEURO:  Oriented to person, place and time, cranial nerves II-XII  grossly intact, motor grossly intact.   EKG:  Sinus rhythm, rate 75, axis within normal limits, QT within normal  limits, no acute ST-T wave changes.   ASSESSMENT AND PLAN:  1. Hypertension.  The patient's blood pressure is still very difficult      to control.  However, her angiogram did not demonstrate any      obstruction.  Therefore, this needs medical management.  She has      been intolerant of multiple medications.  At this point, I am going      to try to increase labetalol to 300 mg twice a day.  She is going      to take her spironolactone at night as it is making her tired.  She      is going to stop the clonidine as she is not tolerating this      because of fatigue.  I am going to try to titrate her meds slowly.      She understands the risk of stroke if she does not tolerate      medications.  I  have tried to urge her to have a sleep study as I      think she may have sleep apnea contributing to her hypertension.      She refuses to have this.  I have also encouraged an appointment      with our behavioral psychologist to help her learn any behavioral      techniques that might help with biofeedback or any anxiety      component of her blood pressure.  She refuses this.  Again, we have      tried multiple medical regimens and she has had side effects to      many of them.  She understands that she is going to need to      tolerate some side effects in order to avoid a stroke, as she is      high risk for this.  2. Leg pain.  The patient has leg pain that she relates to the      procedure.  It is really a numbness and heaviness.  Her exam is      unremarkable.  I am going to get an ultrasound to make sure she has      no Arteriovenous fistula or pseudoaneurysm, though I do not suspect      this.  If her symptoms persist, will need to send her to a      neurologist possibly for some      nerve conduction studies.  3. Followup.  Will see her again in 4 weeks or sooner if needed.     Rollene Rotunda, MD, [facility]  Electronically Signed    JH/MedQ  DD: 08/05/2006  DT: 08/05/2006  Job #: [id number]   cc:   Corwin Levins, MD

## 2010-09-21 NOTE — Discharge Summary (Signed)
NAME:  [name], [name] NO.:  [id number]   MEDICAL RECORD NO.:  [id number]          PATIENT TYPE:  INP   LOCATION:  4731                         FACILITY:  [facility]   PHYSICIAN:  Joellyn Rued, PA-C     DATE OF BIRTH:  [birth year]   DATE OF ADMISSION:  02/12/2006  DATE OF DISCHARGE:  02/14/2006                           DISCHARGE SUMMARY - REFERRING   DICTATED FOR:  Daryel Gerald, PA and Raenette Rover. Felicity Coyer, MD   BRIEF HISTORY:  [name] is a 50 year old obese African American  female who presented to [facility] and referred to [facility] Emergency Room secondary to shortness of breath and orthopnea and  hypertensive crisis.  The patient states that she has chronic dyspnea on  exertion that she has always attributed to her obesity and deconditioning  and this is exacerbated by hypertension, however, over the last week she has  noted decreased provocation with her dyspnea on exertion and has developed  orthopnea.  She denies any weight gain.  She has also noticed a constant  chest discomfort for the past week that she rates an 8 on a scale of 0-10  with intermittent nausea.  She denies vomiting or diaphoresis. After  initially being evaluated at the urgent care clinic, she was transported to  [facility] ER and received a sublingual nitroglycerin which improved her  blood pressure and improved her symptoms.   PAST MEDICAL HISTORY:  Notable for:  1. Uncontrolled hypertension.  2. Obesity.   LABORATORY DATA:  EKG showed normal sinus rhythm, left axis deviation,  normal intervals, delayed R-wave, nonspecific ST-T wave changes.  TSH was  3.155, BNP 153, D-dimer 0.73, H&H 13.7 and 40.6, normal indices, platelets  389,000, WBC 9.2, sodium 139, potassium 3.8, BUN 3, creatinine 0.9, glucose  134.  LFTs were unremarkable except for her AST and ALT were slightly  elevated at 55 and 86.  CK, MB and troponins were within normal limits x3.  PTT 30, PT  13.8, INR 1.0. Total cholesterol 150, triglycerides 55, HDL 46,  LDL 97.   HOSPITAL COURSE:  Dr. Oneida Alar admitted [name] to [facility] [employer] [facility] under Dr. Raphael Gibney name.  She is placed on her home medications as  well as IV Lasix for diuresis.  Her chest x-ray showed cardiomegaly with  diffuse interstitial opacities, probable pulmonary edema.  However, a CT  that was performed on 02/13/2006, to evaluate her D-dimer was negative for  pulmonary embolus and did not show any evidence of pulmonary edema.  It was  noted positive for parenchymal nodules and recommended followup CT.  During  her hospital course, she continued on her medications with improved blood  pressure control.  It is unclear as to how much fluid was diuresed or if  there were any weight changes.  Cardiology was asked to consult on  02/14/2006, in regards to her hypertension and possible pulmonary edema.  She was seen and evaluated by Dr. Antoine Poche.  It was felt that she could be  discharged home with further outpatient evaluation.   DISCHARGE DIAGNOSES:  1. Hypertensive crisis.  2. Obesity.  3. Pulmonary edema.  4. Elevated liver function tests of uncertain etiology.  5. Hyperglycemia.   DISPOSITION:  [name] is given permission to return to work on  02/17/2006.  She is asked to maintain a low salt diet, less than 2 grams per  day. Activity was not restricted.  Wound care is not applicable.  She was  asked to begin a blood pressure diary as well as daily weights and to bring  all medicines, blood pressure diary and weights to all appointments.   DISCHARGE MEDICATIONS:  Her medications at the time of discharge include:  1. Norvasc 10 mg p.o. daily.  2. Aspirin 81 mg daily.  3. Spironolactone 25 mg daily.  4. Lasix 20 mg daily but only as needed for a weight gain of greater than      3 pounds.   She will have a BMET on 10/23/22007, at 12:30 p.m.  At that time she will  have a 2-day adenosine Myoview  also on 02/25/2006, at 12:30. The office will  send her instructions.  She will follow up with Dr. Oliver Barre on  02/18/2006, at 10:15 a.m.  She will follow up with Dr. Antoine Poche on  02/27/2006, at 10:30.   Dictating discharge time, less than 30 minutes.           ______________________________  Joellyn Rued, PA-C     EW/MEDQ  D:  02/14/2006  T:  02/14/2006  Job:  [id number]   cc:   Corwin Levins, MD  Rollene Rotunda, MD, [facility]

## 2010-09-21 NOTE — Assessment & Plan Note (Signed)
[facility]                              CARDIOLOGY OFFICE NOTE   NAME:[name], [name]                  MRN:          [id number]  DATE:03/04/2006                            DOB:          23-Feb-1961    PRIMARY:  Dr. Jonny Ruiz.   REASON FOR PRESENTATION:  To evaluate patient with diastolic heart failure.   HISTORY OF PRESENT ILLNESS:  I had met the patient earlier this month in the  hospital when she presented with dyspnea and hypertension, abnormal echo.  She had some evidence of diastolic dysfunction on that admission.  She had  some hypertensive urgency.  She had questionable hyperkinesis to the apex of  the inferior wall.  Today, she finally presented for the second part of a  stress test that we had been trying to get on this patient.  She has a busy  schedule.   Since going home, she has had none of the severe dyspnea that prompted her  admission.  She has had to take the Lasix a couple times a week when she  feels like her feet are swelling.  However, she has not had any PND or  orthopnea.  She has not had any chest pain, palpitations, pre-syncope, or  syncope.  Unfortunately, she is not weighing herself daily.  We did discuss  this and had extensive education in the hospital.  She also has not started  taking her blood pressure yet, though she did buy a cuff.   I do have the outpatient records now and see that she has some evidence of  fibromuscular dysplasia on a CT angiogram on her left renal artery in 2006.  There was a referral to Dr. Samule Ohm, but she did not present to this  evaluation.   PAST MEDICAL HISTORY:  Hypertension, obesity, vaginal hysterectomy.   ALLERGIES:  MOTRIN caused hives.  She has been intolerant of multiple  medications for blood pressure.   MEDICATIONS:  1. Lasix 20 mg p.r.n.  2. Spironolactone 25 mg q. day.  3. Norvasc 10 mg q. day.   REVIEW OF SYSTEMS:  As stated in the HPI and otherwise negative for  other  systems.   PHYSICAL EXAMINATION:  The patient is in no distress.  Blood pressure 164/97, heart rate 88 and regular, weight 285 pounds, body  mass index 44.  HEENT:  Eyes unremarkable.  Pupils are equal, round, and reactive to light  and accommodation.  Fundi within normal limits.  Oral mucosa unremarkable.  NECK:  No jugular venous distension, wave form within normal limits, carotid  upstroke brisk and symmetric, no bruits, thyromegaly.  LYMPHATICS:  No cervical, axillary, or inguinal adenopathy.  LUNGS:  Clear to auscultation bilaterally.  BACK:  No costovertebral angle tenderness.  CHEST:  Unremarkable.  HEART:  PMI not displaced or sustained, S1 and S2 within normal limits, no  S3, no S4, no murmurs.  ABDOMEN:  Obese, positive bowel sounds, normal in frequency and pitch, no  bruits, rebound, guarding.  No hepatomegaly, splenomegaly.  SKIN:  No rashes, no nodules.  EXTREMITIES:  With 2+ pulses throughout, no edema, cyanosis,  clubbing.  NEURO:  Oriented to person, place, and time, cranial nerves 2-12 grossly  intact, motor grossly intact.   ASSESSMENT AND PLAN:  1. Diastolic heart failure.  The patient does have hypertension.  It has      been difficult to control with subsequent diastolic heart failure.  I      did extensive education again about salt and fluid restriction and the      need for blood pressure control.  2. Hypertension.  Her blood pressure is elevated.  I am going to check a      BMET today on the spironolactone.  She is going to have her blood      pressure cuff correlated with the readings from her company nurse.  She      is then going to start keeping a blood pressure diary.  She promises to      start losing weight, which will probably bring her to target.  I      probably will have to use another medication or may pick Tekturna.      However, I would like to see her blood pressure diary.  She actually      says at work when she does have it checked  and she is not rushing or      stressed, her systolics are in the 140s.  3. Obesity.  We again had a long discussion about weight loss and I      encouraged her to joint her [employer] at work.  4. Renal artery stenosis.  I will discuss this with Dr. Samule Ohm to see if      he thinks further evaluation is warranted based on last year's CT      angiography.  5. Followup.  I will see her back in 2 months or sooner if needed.    ______________________________  Rollene Rotunda, MD, [facility]    JH/MedQ  DD: 03/04/2006  DT: 03/04/2006  Job #: [id number]   cc:   Corwin Levins, MD

## 2010-09-21 NOTE — Op Note (Signed)
NAME:  [name], [name] NO.:  [id number]   MEDICAL RECORD NO.:  [id number]          PATIENT TYPE:  OBV   LOCATION:  9399                          FACILITY:  [facility]   PHYSICIAN:  Freddy Finner, M.D.   DATE OF BIRTH:  May 05, 1961   DATE OF PROCEDURE:  09/24/2004  DATE OF DISCHARGE:                                 OPERATIVE REPORT   PREOPERATIVE DIAGNOSES:  Large fibroids, menorrhagia.   POSTOPERATIVE DIAGNOSES:  Large fibroids, menorrhagia.   OPERATION:  Laparoscopically assisted vaginal hysterectomy.   SURGEON:  Freddy Finner, M.D.   ASSISTANT:  Dineen Kid. Rana Snare, M.D.   ESTIMATED BLOOD LOSS:  400 mL.   COMPLICATIONS:  None.   ANESTHESIA:  General endotracheal.   Details of the present illness recorded in the admission noted. The patient  was admitted on the morning of surgery. She was given a bolus of antibiotics  preoperatively IV. She was placed in PAS hose, she was brought to the  operating room, placed under adequate general anesthesia. She was placed in  the dorsal lithotomy position using the [employer] system. Betadine prep  was carried out with the scrub followed by solution prepping the abdomen,  perineum and vagina. The bladder was evacuated with a Robinson catheter.  Hulka tenaculum was attached to the cervix under direct visualization.  Sterile drapes were applied. Two small incisions were made in the abdomen,  one at the umbilicus and one just above the symphysis. Through the upper  incision, an 11 mm nonbladed disposable trocar was introduced while  elevating the anterior abdomen manually. Direct inspection revealed adequate  placement with no evidence of injury on entry. Pneumoperitoneum was allowed  to accumulate with carbon dioxide gas. A 5 mm trocar was placed in the lower  incision and through this trocar sleeve a blunt probe was used. Systematic  examination of the pelvic and abdominal contents was carried out. Pelvic  findings were  remarkable only for marked enlargement of the uterus  consistent with her clinical examination. The appendix was normal, no  apparent abnormalities were noted in the upper abdomen, no peritoneal  lesions were noted and the pelvis, tubes and ovaries were normal except for  absence of a mid segment of each fallopian tube consistent with previous  tubal ligation. Using the tripolar gyrus device and progress bites, the  infundibulopelvic, round ligament, upper broad ligament was progressively  sealed and divided sharply. This was carried down to a level just above the  uterine arteries. Attention was then turned vaginally. Gas was allowed to  escape from the abdomen, trocars were left in place. A posterior weighted  vaginal retractor was placed, Hulka tenaculum was replaced with the Jacob's  tenaculum. Colpotomy incision was made while tenting the mucosa posterior to  the cervix. Uterosacral pedicles were then taken with the gyrus Heaney style  clamps, sealed and divided. The anterior mucosa was then incised sharply  with a scalpel to release the bladder from the cervix. This was carefully  advanced off the cervix. With progressive bites, the bladder pillars and  cardinal ligament pedicles were taken, sealed and divided  with the gyrus  device. The bladder was further advanced off the cervix and lower segment.  The anterior peritoneum was entered, it was high on the uterus. Vessel  pedicles were taken on each side, sealed and divided using the gyrus device  as well as an additional two pedicles on each side. The uterus was then  cored to allow it to be delivered through the vaginal introitus which it  eventually was. Uterine weight was 554 g. The angles of the vagina were  anchored to the uterosacral's with a mattress suture of #0 Monocryl. The  uterosacral's were plicated and posterior peritoneum closed with an  interrupted #0 Monocryl suture. The cuff was closed vertically with figure-   of-eights of #0 Monocryl. A Foley catheter was placed. Reinspection was then  carried out laparoscopically. Small oozing coursing along the posterior  pedicles, uterosacral's and area near the cuff were then fulgurated with the  gyrus device. Hemostasis was felt to be complete. All pack, needle and  instrument counts were correct, instruments removed, gas was allowed to  escape from the abdomen. Skin incisions were closed with interrupted  subcuticular sutures of 3-0 Dexon. Marcaine 0.25% plain was injected into  the incision site for postoperative analgesia. The patient was awakened and  taken to recovery room in good condition.      WRN/MEDQ  D:  09/24/2004  T:  09/24/2004  Job:  [id number]

## 2010-09-21 NOTE — Progress Notes (Signed)
[facility]                        PERIPHERAL VASCULAR OFFICE NOTE   NAME:[name], [name]                  MRN:          [id number]  DATE:07/03/2006                            DOB:          Jul 11, 1960    REASON FOR CONSULTATION:  I was asked by Rollene Rotunda, MD, [facility] to see  [name] regarding her uncontrolled hypertension and fibromuscular  dysplasia.   HISTORY OF PRESENT ILLNESS:  [name] is a 50 year old lady who  has had hypertension for 13 years.  She says her blood pressure has  rarely been below 140/90 and has frequently been substantially higher  than this.  She has been tried on a number of medications which she has  stopped mostly due to ineffectiveness.  It appears that she has been  reluctant to take more than one medication at a time for the most part.  She says the only medications that she can remember actually not  tolerating was Norvasc which caused edema.  However, her chart notes  adverse reactions to Atacand, Micardis, and Tekturna as well.   In June of 2006, she underwent a CT angiogram of the abdomen.  This  suggested fibromuscular dysplasia of the left renal artery.  She  subsequently saw Cristy Hilts. Jacinto Halim, M.D. of [facility]  for this problem at the request of Dr. Oliver Barre.  He ordered a number  of tests.  It is not clear that she showed up for these tests.  She says  she did not return to see him.  She was then hospitalized in October of  2007 with diastolic heart failure.  She was found to have left  ventricular hypertrophy on echocardiography.  She has since seen Dr.  Antoine Poche on a number of occasions.  Blood pressure has ranged from a low  of 150/100 to a high of 190/119 as measured today.  She says she has  been compliant with her medications throughout that period.   PAST MEDICAL HISTORY:  1. Hypertension as detailed above.  2. Vaginal hysterectomy.  3. Obesity.   ALLERGIES:   MOTRIN causes hives. NORVASC causes edema.   CURRENT MEDICATIONS:  1. Clonidine 0.1 mg twice daily.  2. Spironolactone 25 mg daily.   SOCIAL HISTORY:  The patient lives in [place].  She has two children,  ages 34 and 71.  She is married.  She works in Clinical biochemist for  [employer].  She denies tobacco, alcohol, and illicit drug  use.   FAMILY HISTORY:  Remarkable for her father dying in a motor vehicle  accident.  Mother died several years ago, details are not clear to her.  Both of her siblings and both of her children are alive and well.   REVIEW OF SYSTEMS:  Frequent headaches which she calls migraines.  No  clear aura with them.  Otherwise negative in detail except as above.   PHYSICAL EXAMINATION:  GENERAL:  She is obese, otherwise well-appearing  in no distress.  VITAL SIGNS:  Heart rate 82, blood pressure 190/119.  She weighs 290  pounds and has a body mass index of 44.  HEENT:  Normal.  SKIN:  Normal.  MUSCULOSKELETAL:  Normal.  She has no jugular venous distention,  thyromegaly, or lymphadenopathy.  LUNGS:  Respiratory effort is normal.  Lungs are clear to auscultation.  HEART:  She has a nonpalpable point of maximal cardiac impulse.  Regular  rate and rhythm without murmur, rub, S3, or S4.  ABDOMEN:  Obese, soft, nondistended, and nontender.  Due to her habitus,  I cannot appreciate her liver or spleen.  There is no mass, though,  examination is limited by her habitus.  Bowel sounds are normal.  There  is no evident abdominal bruit.  Carotid pulses are 2+ bilaterally  without bruit.  Radial pulses 2+ bilaterally.  DP and PT pulses 2+  bilaterally.   IMPRESSION:  1. The patient has malignant hypertension with end organ damage (left      ventricular hypertrophy).  2. Fibromuscular dysplasia of the left renal artery appears to be      playing a role.  I have stressed to her the critical importance of      establishing control of her blood pressure.   I have told her that      if the fibromuscular dysplasia is true, this may well be difficult      with medical therapy alone, as her history has proven.  I have      therefore suggested that we proceed to invasive angiography for      confirmation of left renal fibromuscular dysplasia.  At that time,      would recommend proceeding to balloon angioplasty and possible      stenting of the renal artery if effected.  I reviewed risks of the      procedure in detail including bleeding, infection, vascular injury,      and worsening renal function.  She seems to focus much more      strongly on the potential risks of the procedure than on the      potential risks of her uncontrolled hypertension.  I have offered      to proceed with the procedure 1 week from tomorrow.  She says she      will consider this.  We will contact her by telephone for a      decision on Monday.     Salvadore Farber, MD  Electronically Signed    WED/MedQ  DD: 07/03/2006  DT: 07/04/2006  Job #: [id number]   cc:   Corwin Levins, MD  Rollene Rotunda, MD, [facility]

## 2010-09-21 NOTE — Assessment & Plan Note (Signed)
[place] [facility]                            CARDIOLOGY OFFICE NOTE   NAME:Park Park Park                  MRN:          [id number]  DATE:07/03/2006                            DOB:          12/01/1960    REASON FOR PRESENTATION:  Evaluate the patient with difficult to control  hypertension.   HISTORY OF PRESENT ILLNESS:  The patient presents for follow up of her  very difficult to control hypertension and her problem with various  medications.  She was last seen by our PA on June 20, 2006.  At that  time, her blood pressure was 181/110.  She was treated with Clonidine in  addition to spironolactone.  She had most recently been intolerant to  Norvasc and [place].   She now presents for follow up.  She states she has had a recent flu.  Currently, she is taking the medications as listed below.  Mr. Alben Spittle  did note her in previous chart that there had been a question of  fibromuscular dysplasia of her left renal artery.  She did see  [facility] after initially canceling an appointment here.  She did  have an extensive workup with a stress perfusion study and an  echocardiogram, renal duplex.  They also recommended a sleep study.  I  am not clear how much of this was done.   The patient currently denies any new symptoms.  She is not having any  new shortness of breath.  Denies any chest discomfort, palpitations,  presyncope or syncope.   PAST MEDICAL HISTORY:  1. Hypertension.  2. Obesity.  3. Vaginal hysterectomy.   ALLERGIES:  1. MOTRIN.  2. ATACAND.  3. MICARDIS.  4. NORVASC.  5. TECKTURNA.   MEDICATIONS:  1. Clonidine 0.1 mg b.i.d.  2. Spironolactone 25 mg daily.   REVIEW OF SYSTEMS:  As stated in the HPI and otherwise negative for  other systems.   PHYSICAL EXAMINATION:  The patient is in no distress.  Blood pressure  190/119, heart rate 82 and regular, weight 290 pounds.  Body mass index  44.  NECK:  No jugular venous  distention at 45 degrees.  Carotid upstroke  brisk and symmetric.  No bruits, no thyromegaly.  LYMPHATICS:  No cervical, axillary or inguinal adenopathy.  LUNGS:  Clear to auscultation bilaterally.  BACK:  No cerebrovascular accident tenderness.  CHEST:  Unremarkable.  HEART:  PMI not displaced or sustained.  S1-S2 within normal limits.  No  S3.  No S4, no murmurs.  ABDOMEN:  Obese, positive bowel sounds.  Normal in frequency, pitch.  No  rebounds, guarding or midline pulsatile masses, hepatosplenomegaly.  SKIN:  No rashes and no nodules.  EXTREMITIES:  Pulses 2+ throughout.  No clubbing, cyanosis, or edema.  NEUROLOGICAL:  Oriented to person, place and time.  Cranial nerves II  through XII grossly intact.  Motor grossly intact throughout.   ASSESSMENT/PLAN:  1. Hypertension:  May well be that her hypertension is related to      renal artery stenosis and fibromuscular dysplasia.  I know this has      ever been investigated with  a renal angiogram.  I discussed this      with Dr. Samule Ohm today and he suggest this as the next step.  She      will be given a prescription for labetalol and I will defer this to      Dr. Samule Ohm.  He is going to see her in consultation.  2. Follow-up:  Hopefully, the patient will follow up with the advice      of Dr. Samule Ohm and the next step will angiography and perhaps      intervention as needed.  I think there is high probability of      finding renal artery stenosis that could be treated and potentially      lead to improvement in her hypertension.  We have expressed to her      multiple times the importance of her blood pressure control that      she is at risk for stroke, heart attack, heart failure, renal      failure and death.  The patient understands but she continues to      have a difficult time following through with medical regimens and      medical suggestions.     Rollene Rotunda, MD, [facility]  Electronically Signed    JH/MedQ  DD: 07/07/2006   DT: 07/08/2006  Job #: [id number]   cc:   Corwin Levins, MD

## 2010-09-21 NOTE — Op Note (Signed)
NAME:  [name], [name] NO.:  [id number]   MEDICAL RECORD NO.:  [id number]          PATIENT TYPE:  AMB   LOCATION:  SDS                          FACILITY:  [facility]   PHYSICIAN:  Salvadore Farber, MD  DATE OF BIRTH:  [birth year]   DATE OF PROCEDURE:  07/11/2006  DATE OF DISCHARGE:                               OPERATIVE REPORT   PROCEDURE:  Selective bilateral renal angiography, Angio-Seal closure of  the right common femoral arteriotomy site.   INDICATIONS:  Ms. Hadaway is a 50 year old lady with 13 years of  hypertension.  She tells me it has never been well controlled partly out  of what appears to be poor compliance with medical therapy.  She had a  CT angiogram a year ago that suggested fibromuscular dysplasia of the  left renal artery.  I recommended proceeding to angiography for  definitive diagnosis to guide further therapy.   PROCEDURE TECHNIQUE:  Informed consent was obtained.  Under 1% lidocaine  local anesthesia, a 5-French sheath was placed in the right common  femoral artery using modified Seldinger technique.  A pigtail catheter  was advanced to the suprarenal abdominal aorta.  Abdominal aortography  was performed by power injection.  I then exchanged for a 5-French LIMA  catheter which was used to selectively engage the single right renal  artery and both of the two left renal arteries.  Selective angiography  was performed.   The arteriotomy was then closed using Angio-Seal device.  Complete  hemostasis was obtained.  She was transferred to holding room in stable  condition having tolerated the procedure well.   COMPLICATIONS:  None.   FINDINGS:  1. Abdominal aorta:  Normal abdominal aorta with no atherosclerotic      plaque and no aneurysm.  2. Left renal arteries:  There are two left renal arteries of roughly      equal in size.  Both are angiographically normal.  The left kidney      appears normal in size.  3. Right renal artery:   Single vessel.  It is angiographically normal.      The right kidney appears normal in size.   IMPRESSION/RECOMMENDATIONS:  Angiographically normal renal arteries.  There is no suggestion of fibromuscular dysplasia.  Will continue  medical therapy for her hypertension.      Salvadore Farber, MD  Electronically Signed     WED/MEDQ  D:  07/11/2006  T:  07/11/2006  Job:  [id number]   cc:   Rollene Rotunda, MD, [facility]

## 2010-09-21 NOTE — Consult Note (Signed)
NAME:  [name], [name] NO.:  [id number]   MEDICAL RECORD NO.:  [id number]          PATIENT TYPE:  INP   LOCATION:  4731                         FACILITY:  [facility]   PHYSICIAN:  Rollene Rotunda, MD, FACCDATE OF BIRTH:  06-26-1960   DATE OF CONSULTATION:  02/14/2006  DATE OF DISCHARGE:  02/14/2006                                   CONSULTATION   PRIMARY CARE PHYSICIAN:  Corwin Levins, MD   REFERRING PHYSICIAN:  Raenette Rover. Felicity Coyer, MD.   REASON FOR PRESENTATION:  Evaluate patient with dyspnea, hypertension and  abnormal echocardiogram.   HISTORY OF PRESENT ILLNESS:  The patient is 50 year old African-American  female with difficult-to-control hypertension.  She has had this for about  13 years, but she says her blood pressure is rarely below 140/90.  She has  tried apparently multiple medications and has been intolerant of these.  She  has been on Norvasc and hydrochlorothiazide.  She takes the  hydrochlorothiazide infrequently.  She says she gets dyspneic when her blood  pressure is elevated.  This has been a stable pattern.  [place]  She was short of breath walking stairs and up  inclines.  However, when she got back to [place] on Monday. she noticed  she was dyspneic walking just a couple of yards on level ground.  She was  also having more shortness of breath at night.  She chronically sleeps on  two pillows and breathes comfortably.  She was having dyspnea doing this.  She did have some chest pressure when she was short of breath.  She called  EMS and was said to have blood pressure in the 160s/110.  She did get  sublingual nitroglycerin and had improvement in her chest pressure and has  had none since then.  She said this pressure is similar to the discomfort  she had before when her blood pressure goes up.  She denies any discomfort  in her neck or arms.  She had no associated nausea, vomiting or diaphoresis.  This was a  mild discomfort.  She has not had any palpitations, presyncope or  syncope.  She did have swelling which she intermittently has, again, when  her blood pressure goes up.  She had not been having any dietary changes or  change in over-the-counter medications.  She says she is active, walking 30  minutes most days or week at a brisk pace and cannot bring on any chest  discomfort, neck discomfort, arm discomfort or other anginal equivalents.   PAST MEDICAL HISTORY:  1. Hypertension.  2. Obesity.  3. She has no history of dyslipidemia.  She had some borderline elevated      blood sugars.   PAST SURGICAL HISTORY:  Vaginal hysterectomy.   ALLERGIES:  MOTRIN caused hives.  She says she has been intolerant of  multiple blood pressure medicines, but I do not know this list.   MEDICATIONS:  1. Norvasc 10 mg daily.  2. Hydrochlorothiazide 25 mg occasionally.  3. Fish oil.  4. Women's vitamin.   SUGGESTIONS:  The patient lives [place].  She has 2 children.  She works  for [employer].  She does not smoke cigarettes.  She  does not drink alcohol.   FAMILY HISTORY:  Is noncontributory for early coronary artery disease.  Father died young of motor vehicle accident.  Her siblings are younger than  her.   REVIEW OF SYSTEMS:  As stated in the HPI, positive for migraines, nocturia,  increased stress following the death of her mother a couple of years ago.  Negative for other systems.   PHYSICAL EXAMINATION:  GENERAL:  The patient is in no distress.  VITAL SIGNS: Blood pressure 131/84, heart rate 75 and irregular, afebrile  __________, 95% saturation on room air.  HEENT:  Eyelids unremarkable, pupils equal and reactive.  Fundi not  visualized. Oral mucosa unremarkable.  NECK:  No jugular distension at 45 degrees, carotid upstroke brisk and  symmetrical. No bruits, thyromegaly.  LYMPHATICS: No cervical, axillary, inguinal adenopathy.  LUNGS:  Clear to auscultation  without wheezing or crackles or dullness to  percussion.  BACK:  No costovertebral angle tenderness.  CHEST:  Unremarkable.  HEART:  PMI not displaced or sustained, S1-S2 within normal limits, no S3,  no S4, no murmurs.  ABDOMEN:  Morbidly obese, positive bowel sounds, normal in frequency and  pitch. No bruits, rebound, guarding or midline pulsatile mass.  No  hepatomegaly or splenomegaly.  SKIN:  No rashes, no nodules.  EXTREMITIES:  2+ pulses throughout, no bruits, no cyanosis or clubbing, mild  bilateral lower extremity edema.  NEUROLOGIC: Oriented to person, place, and time. Cranial nerves II-XII  grossly intact.  Motor grossly intact.   Chest x-ray: Cardiomegaly with a diffuse interstitial opacities, probable  pulmonary edema.   However, chest CT demonstrated no pulmonary embolism, no pulmonary edema,  questionable parenchymal nodules, and followup suggested.   EKG revealed sinus rhythm, right axis deviation, poor anterior R-wave  progression, nonspecific diffuse T-wave flattening.   Labs:  WBC 9.2, hemoglobin 13.7, platelets 389. Sodium 139, potassium 3.8,  BUN 3, creatinine 0.9, glucose 134, AST 55, ALT 86. TSH 3.155. BNP 153. D-  dimer 0.73.  INR 1.0.  Triglycerides 65, HDL 46, LDL 97.  Troponin peak  0.07. All other MBs and troponin negative.   Echocardiogram: Well-preserved ejection fraction with an EF of 55% with  questionable hypokinesis of the apical aspect of the inferior wall.  These  were technically difficult images.  Ventricular wall thickness was mildly  increased.   ASSESSMENT/PLAN:  #1.  Shortness of breath.  The patient's shortness of  breath seems be related to be hypertensive episode that she had with  questionable pulmonary edema.  Her BNP was slightly elevated, particularly  for a patient with obesity.  She responded to intervention that did result  in her blood pressure coming down.  She was also diuresed, though we cannot quantify this. She thinks she  is urinating quite a bit now.  She is  breathing at baseline.  She had no chest pressure since that treatment and  does not have a history of angina despite significant exertion.  She does  have questionable abnormality on echocardiogram.  I think pretest  probability of obstructive coronary disease is not high but may be moderate  a cannot exclude without stress perfusion imaging.  I have suggested that  she could have this done as an outpatient but would like to do it on Monday.  I even offered her for have part of the test done today.  It  would probably  need to be two parts because of her obesity.  She refuses to stay in the  Hospital for any further intervention.  She will not have the stress test  today if it cannot be completed and if the second part has to take place  next week.  She says she is going to go to work next week and will be  available to have the stress test the week after that.  She understands that  I think there is some risk to this, and she accepts this risk.  Therefore,  will plan an outpatient Cardiolite as early as responsible next week.   In the meantime, I think, again, this most likely related to her  hypertension and probably some diastolic dysfunction.  We discussed  extensively salt restriction (greater than 45 minutes at this consult). We  discussed weight loss as his below.  I am going to start her on  spironolactone 25 mg a day.  She is going to stop hydrochlorothiazide.  She  is going to stop excessive potassium supplementation to her diet which she  has done.  She is going to get a BMET next week when she comes back for the  stress test.  She understands the risk of this medicine, particularly the  hyperkalemia.  She knows she needs careful followup and compliance with lab  draws.  I do not see any other contraindications at this point for this  drug.  She will continue the Norvasc at 10 mg.  I have also given her  p.r.n. Lasix.  She says she will  comply with daily weights.  If she gains 3-  4 pounds in a day, she can take 20 mg of Lasix and then let our office know  that she is requiring this.  Careful attention to this detail, compliance,  adherence with appointments, daily weights and blood pressure control will  probably lead to fewer symptoms in the future.  #2.  Obesity.  Again, we discussed at great length the need to lose weight.  She is going to join [employer] at work.  I discussed with her low-  glycemic died such as [place].  This will be key going forward.  #3.  Followup.  I will see her back in the office on October 25.[name]  She has a  stress test scheduled.  She has labs scheduled for next week.           ______________________________  Rollene Rotunda, MD, [facility]     JH/MEDQ  D:  02/14/2006  T:  02/16/2006  Job:  [id number]   cc:   Corwin Levins, MD

## 2010-09-21 NOTE — H&P (Signed)
NAME:  [name], [name] NO.:  [id number]   MEDICAL RECORD NO.:  [id number]          PATIENT TYPE:  INP   LOCATION:  4731                         FACILITY:  [facility]   PHYSICIAN:  Corwin Levins, MD      DATE OF BIRTH:  December 23, 1960   DATE OF ADMISSION:  02/12/2006  DATE OF DISCHARGE:                              HISTORY & PHYSICAL   CHIEF COMPLAINT:  Shortness of breath.   HISTORY OF PRESENT ILLNESS:  This is a 50 year old woman, followed by  Dr. Jonny Ruiz, for hypertension and morbid obesity, who reports shortness of  breath and orthopnea for one week, which has been relieved with sitting  upright.  Chest heaviness over the same period of time.  No palpitations  or syncope.  She symptoms began during [place].  They  were initially dismissed as secondary to either elevation or the  patient's weight.  She is sedentary most of the day and has noted  bilateral calf pain during the last week.   ALLERGIES:  TO MOTRIN.   MEDICATIONS:  Hydrochlorothiazide and Norvasc.   PAST MEDICAL HISTORY:  1. Hypertension.  2. Hysterectomy.   SOCIAL HISTORY:  Lives locally with her significant other.  No tobacco.  No alcohol.  No drugs.   FAMILY HISTORY:  Positive for cancer and hypertension.   REVIEW OF SYSTEMS:  Otherwise negative.   PHYSICAL EXAMINATION:  VITAL SIGNS:  Temperature is 98.3.  Pulse is 77.  Respiratory rate is 18.  Blood pressure is 182/125 on arrival, 145/88  after sublingual nitroglycerin.  Saturations are 98% on room air.  GENERAL:  She is an obese woman, pleasant and in no apparent distress.  NECK:  Supple with no lymphadenopathy.  Jugular venous pulsations  appeared normal.  HEENT EXAM:  Normocephalic, atraumatic with pupils equally round and  reactive to light.  Extraocular motions are intact.  CARDIOVASCULAR EXAM:  PMI is slightly deviated.  S1 and S2 are distant,  but regular with no murmurs, rubs or gallops.  LUNGS:  Clear to auscultation  bilaterally.  SKIN:  Shows no rashes.  ABDOMEN:  Soft, nontender, with positive bowel sounds.  EXTREMITIES:  Show no clubbing, cyanosis or edema.  NEUROLOGIC EXAM:  Alert and oriented x3 with cranial nerves II-XII  grossly intact.  Strength 5/5 in all extremities and axial groups with  normal sensation in the cerebellar function.   Chest x-ray shows moderate pulmonary edema.  EKG shows normal sinus  rhythm with a right axis deviation.  Intervals of 178.  QRS of 198 and  QTc of 420.   LABS:  Show a hematocrit of 42, sodium of 138, potassium of 3.1,  chloride of 104, BUN of 5, creatinine 0.9, glucose of 116.  Troponin  0.5, MB of 2.1.   ASSESSMENT/PLAN:  This is a 50 year old female with orthopnea, dyspnea  on exertion for 1 week and hypertensive crisis in a setting of bilateral  leg pain and pulmonary edema on chest x-ray, positive D-dimer, and right  axis deviation in a morbidly obese young woman.  The shortness of breath  and edema are  concerning given the history of hypertension.  We will  certainly need to rule out for pulmonary embolism.  Nitroglycerin will  be continued for better pain control, IV Lasix as well.  We will check  an echocardiogram to evaluate left ventricular function with serial  enzymes.  We will add a beta blocker to her medical regimen.  We will  check a CT angio.  If recurrent pulmonary edema flashes continue in the  setting of hypertension, may also consider renal artery stenosis as long-  term diagnosis.  However, for now, we will manage the patient's fluid  overload and blood pressure and control her symptoms.      Daniel B. Haithcock, MD   Electronically Signed     ______________________________  Corwin Levins, MD    DBH/MEDQ  D:  02/13/2006  T:  02/13/2006  Job:  [id number]

## 2010-09-21 NOTE — Discharge Summary (Signed)
NAME:  [name], [name] NO.:  [id number]   MEDICAL RECORD NO.:  [id number]          PATIENT TYPE:  OBV   LOCATION:  9304                          FACILITY:  [facility]   PHYSICIAN:  Freddy Finner, M.D.   DATE OF BIRTH:  04-05-1961   DATE OF ADMISSION:  09/24/2004  DATE OF DISCHARGE:  09/25/2004                                 DISCHARGE SUMMARY   DISCHARGE DIAGNOSES:  1.  Uterine leiomyomata.  2.  Clinical symptoms of severe menorrhagia and dysmenorrhea.   PROCEDURE:  Laparoscopically-assisted vaginal hysterectomy.   COMPLICATIONS:  Intraoperative and postoperative complications were none.   DISPOSITION:  The patient is in satisfactory and improved condition at the  time of her discharge.  She is to have progressively increasing physical  activity, but no vaginal entry or heavy lifting.  She is to return to the  office in 2 weeks for postoperative follow-up.  She is given Percocet to be  taken as needed for postoperative pain in conjunction with ibuprofen.  She  is to take a regular diet.   Details of the present illness, past history, family history, review of  systems, and physical examination recorded in the admission note.   Physical findings on admission are remarkable for enlargement of the uterus.   LABORATORY DATA:  During this admission includes a normal CBC on admission  with hemoglobin of 12.7, postoperative hemoglobin on the afternoon of  surgery was 11.7 and 10.9 on the morning of discharge.  Admission  prothrombin time and PTT and urinalysis were all normal.   HOSPITAL COURSE:  The patient was admitted on the morning of surgery.  She  was treated perioperatively with IV antibiotics and PAS hose.  She was  brought to the operating room where the above described procedure was  accomplished without significant difficulty.  The uterus was enlarged and  measured 554 grams in the operating room immediately after resection of the  uterus.  The patient's  postoperative course was uncomplicated.  She remained  afebrile throughout the hospital stay.  By the morning of the first  postoperative day she was ambulating without difficulty.  She was tolerating  a regular diet.  She was having adequate bowel and bladder function.  Her  condition was considered to be satisfactory and she was discharged home with  disposition as noted above.      WRN/MEDQ  D:  09/25/2004  T:  09/25/2004  Job:  [id number]

## 2010-09-21 NOTE — Assessment & Plan Note (Signed)
[facility]                            CARDIOLOGY OFFICE NOTE   [name], [name]                    MRN:          [id number]  DATE:06/20/2006                            DOB:          [birth year]    PRIMARY CARE PHYSICIAN:  [name], M.D.   PRIMARY CARDIOLOGIST:  [name], M.D.   HISTORY OF PRESENT ILLNESS:  [name] is a very pleasant 50-year-  old female patient followed by [name] with a history of  hypertension and diastolic congestive heart failure who returns to the  office today for followup for high blood pressure.  [name]  actually has tried her on several medications to which she has been  intolerant.  She has been intolerant to ATACAND, MICARDIS, NORVASC, and  TEKTURNA.  He recently talked to her over the telephone and increased  her aldactone to 50 mg a day.  The patient notes her pressure has been  running high at work and she set up this appointment today.  The patient  took the 50 mg of Aldactone when she talked to [name].  However,  she did not take it yesterday at all because she was coming in today and  did not know if she was going to be given something else to take.  She  is also complaining of some flu-like symptoms.  She has a prescription  for Pseudovent which has a decongestant in it.  She took that yesterday  for her head congestion.  She does feel somewhat lightheaded, which is  worse with coughing.  She has a dry, nonproductive cough.  Denies any  fever or chills.  Denies sore throat.  Denies any otalgia.  She does  occasionally have some chest discomfort when her pressure goes higher.  This is not unusual for her and she has had this for quite some time.  She does also note some shortness of breath going up steps.  This is not  any worse.  She denies any orthopnea.  Denies any paroxysmal nocturnal  dyspnea.  Denies any syncope.   CURRENT MEDICATIONS:  Currently she is not taking  anything.  She should  be taking spironolactone 50 mg a day.   ALLERGIES:  She is intolerant to ATACAND, NORVASC, MICARDIS, and  TEKTURNA.  She is allergic to [facility] which causes hives.   PHYSICAL EXAMINATION:  GENERAL:  She is a well-nourished, well-  developed, obese female in no acute distress.  VITAL SIGNS:  Blood pressure 181/105, pulse 103.  Repeat blood pressure  by me is 182/102 on the left, 184/110 on the right.  HEENT:  Head normocephalic, atraumatic.  Eyes:  PERRLA, EOMI, sclerae  clear.  Oropharynx pink without exudate.  LYMPH:  Without lymphadenopathy.  CARDIAC:  Normal S1, S2.  Regular rate and rhythm.  LUNGS:  Clear to auscultation bilaterally without wheezing, rhonchi or  rales.  ABDOMEN:  Soft, nontender.  EXTREMITIES:  With trace edema bilaterally.  NEUROLOGIC:  She is alert and oriented x3.  Cranial nerves II-XII  grossly intact.   Electrocardiogram reveals sinus tachycardia with a heart rate of  103,  left axis deviation, no significant change since previous tracings.   IMPRESSION:  1. Uncontrolled hypertension.  2. Obesity.  3. Questionable left renal artery stenosis - suggestive of      fibromuscular dysplasia by previous CT angiogram.  4. Chronic diastolic congestive heart failure - stable.  5. Obesity.  6. Upper respiratory tract infection.   PLAN:  The patient presents to the office today for followup on her  blood pressure.  Unfortunately, she did not take her spironolactone  yesterday or today.  She also took a decongestant yesterday for her URI.  Her pressure is elevated in the office and we gave her 0.1 of clonidine.  We spent about 60 minutes with her after giving the medication and  monitoring her blood pressure before she left the office.  It was 150/90  after one clonidine.  She does feel much better.  She can always tell  when her blood pressure is coming down.  She does still feel somewhat  lightheaded secondary to her head congestion.  I  have asked her to stop  taking the Pseudovent.  I have suggested she use over-the-counter saline  nose spray as well as Mucinex or Mucinex DM.  She is to stay away from  anything with a decongestant in it.  If she develops any fevers, chills,  sore throat, otalgia, productive cough, then she should follow up with  [name] for further evaluation and treatment.   Regarding her blood pressure, the clonidine did seem to bring her  pressure down nicely.  I have recommended that we place her on clonidine  in addition to the spironolactone.  She is intolerant to many  medications and I would like to put her on the clonidine slowly.  We  will place her on 0.1 mg of clonidine daily for the next 3 days, and  then she will go up to 0.1 mg twice daily.  She will continue on the  spironolactone 50 mg a day.  She will get a BMET next week as directed  by [name].  We will make sure she has followup in the next couple  of weeks with [name].  She knows to call us if she begins to feel  worse.  Again, she knows to follow up with her primary care physician  regarding her upper respiratory tract infection as needed.   Of note, she has a h/o fibromuscular dysplasia in the left renal artery.  I will be in touch with [name] regarding follow up for this.     [name] Newcomer, PA-C  Electronically Signed      [name] Abed, MD, [facility]  Electronically Signed   SW/MedQ  DD: 06/20/2006  DT: 06/20/2006  Job #: [id number]

## 2010-09-21 NOTE — Assessment & Plan Note (Signed)
[facility]                            CARDIOLOGY OFFICE NOTE   NAME:Park, [name] SIMMERMAN                  MRN:          [id number]  DATE:05/13/2006                            DOB:          July 07, 1960    PRIMARY:  Dr. Jonny Ruiz.   REASON FOR PRESENTATION:  Evaluate patient with hypertension, diastolic  heart failure.   HISTORY OF PRESENT ILLNESS:  The patient returns for followup.  Since I  last saw her, she has experimented with the Norvasc, taking herself off  it at times and saying that she feels much better without it.  She said  she was having increased lower extremity swelling and abdominal swelling  with this and, also, palpitations and increased dyspnea.  She has  continued to take spironolactone and takes Lasix only when she is on the  Norvasc.  She has not yet started her exercise regimen to lose weight.  She is not having any acute shortness of breath, PND, or orthopnea like  she had when she was hospitalized.  She is not having any chest  pressure, neck discomfort, or arm discomfort.  She has had no  palpitations other than described.  No presyncope or syncope.   PAST MEDICAL HISTORY:  1. Hypertension.  2. Obesity.  3. Vaginal hysterectomy.   ALLERGIES:  MOTRIN CAUSED HIVES.  SHE HAS BEEN INTOLERANT OF MULTIPLE  MEDICATIONS INCLUDING ATACAND, MICARDIS, AND, NOW, NORVASC.   MEDICATIONS:  1. Norvasc 10 mg daily.  2. Spironolactone 25 mg daily.   REVIEW OF SYSTEMS:  As stated in the HPI and otherwise negative for  other systems.   PHYSICAL EXAMINATION:  The patient is in no acute distress.  Blood pressure 150/100, heart rate 81 and regular, weight 287 pounds.  HEENT:  Eyes unremarkable.  Pupils equal, round, and reactive to light.  Fundi within normal limits.  Oral mucosa unremarkable.  NECK:  No jugular venous distention, waveform within normal limits,  carotid upstroke brisk and symmetric, no bruits, no thyromegaly.  LYMPHATICS:   No cervical, axillary, or inguinal adenopathy.  LUNGS:  Clear to auscultation bilaterally.  BACK:  No costovertebral angle tenderness.  CHEST:  Unremarkable.  HEART:  PMI not displaced or sustained, S1 and S2 within normal limits.  No S3, no S4, no murmurs.  ABDOMEN:  Obese, positive bowel sounds, normal in frequency and pitch,  no bruits, no rebound, no guarding.  No midline pulsatile mass, no  hepatomegaly, no splenomegaly.  SKIN:  No rashes, no nodules.  EXTREMITIES:  2+ pulses, no edema, no cyanosis, no clubbing.  NEURO:  Oriented to person, place, and time, cranial nerves II through  XII grossly intact, motor grossly intact.   ASSESSMENT AND PLAN:  1. Hypertension.  The patient is somewhat difficult to treat because      she has reactions to many medications.  I doubt that she will      tolerate beta blockers.  She has now not tolerated the calcium-      channel blocker or the angiotensin receptor blocker.  I am going to      try Tekturna.  We  discussed this at length.  She understands the      risk of hyperkalemia in combination with spironolactone.  I think      this would be safe and just needs to be monitored carefully.  We      discussed avoiding potassium-containing foods.  I will start her on      150 mg daily.  She should keep a blood pressure diary.  We are      going to continue to concentrate on weight loss.  We will check a      BMET in 10 days.  2. Followup.  I want to see her back in about 6 months.  At that      point, we will also schedule a CT as she has some abnormal lymph      nodes that needed followup.     Rollene Rotunda, MD, [facility]  Electronically Signed    JH/MedQ  DD: 05/13/2006  DT: 05/13/2006  Job #: [id number]   cc:   Dr. Jonny Ruiz

## 2010-09-24 NOTE — Telephone Encounter (Signed)
Left message to call back  

## 2010-09-26 NOTE — Telephone Encounter (Signed)
Left message on machine about results. 

## 2010-12-17 ENCOUNTER — Ambulatory Visit (INDEPENDENT_AMBULATORY_CARE_PROVIDER_SITE_OTHER): Payer: Managed Care, Other (non HMO) | Admitting: Family Medicine

## 2010-12-17 ENCOUNTER — Encounter: Payer: Self-pay | Admitting: Family Medicine

## 2010-12-17 VITALS — BP 142/90 | HR 87 | Temp 98.0°F | Wt 308.0 lb

## 2010-12-17 DIAGNOSIS — I10 Essential (primary) hypertension: Secondary | ICD-10-CM

## 2010-12-17 DIAGNOSIS — R350 Frequency of micturition: Secondary | ICD-10-CM

## 2010-12-17 DIAGNOSIS — R609 Edema, unspecified: Secondary | ICD-10-CM

## 2010-12-17 DIAGNOSIS — R739 Hyperglycemia, unspecified: Secondary | ICD-10-CM

## 2010-12-17 DIAGNOSIS — H109 Unspecified conjunctivitis: Secondary | ICD-10-CM

## 2010-12-17 DIAGNOSIS — R7309 Other abnormal glucose: Secondary | ICD-10-CM

## 2010-12-17 DIAGNOSIS — N318 Other neuromuscular dysfunction of bladder: Secondary | ICD-10-CM

## 2010-12-17 DIAGNOSIS — R600 Localized edema: Secondary | ICD-10-CM

## 2010-12-17 DIAGNOSIS — N3281 Overactive bladder: Secondary | ICD-10-CM

## 2010-12-17 LAB — BASIC METABOLIC PANEL
GFR: 84.89 mL/min (ref >60.00)
Glucose, Bld: 152 mg/dL — ABNORMAL HIGH (ref 70–99)
Potassium: 2.9 mEq/L — ABNORMAL LOW (ref 3.5–5.1)
Sodium: 137 mEq/L (ref 135–145)

## 2010-12-17 LAB — HEMOGLOBIN A1C: Hgb A1c MFr Bld: 7.2 % — ABNORMAL HIGH (ref 4.6–6.5)

## 2010-12-17 LAB — POCT URINALYSIS DIPSTICK
Glucose, UA: NEGATIVE
Spec Grav, UA: 1.01
Urobilinogen, UA: 0.2

## 2010-12-17 MED ORDER — HYDROCHLOROTHIAZIDE 25 MG PO TABS: 25.0000 mg | ORAL_TABLET | Freq: Every day | ORAL | Status: DC

## 2010-12-17 MED ORDER — TOBRAMYCIN 0.3 % OP SOLN: 2.0000 [drp] | OPHTHALMIC | Status: AC

## 2010-12-17 MED ORDER — SOLIFENACIN SUCCINATE 10 MG PO TABS: 10.0000 mg | ORAL_TABLET | Freq: Every day | ORAL | Status: DC

## 2010-12-17 NOTE — Progress Notes (Signed)
  Subjective:    Patient ID: [name], female    DOB: 11/12/1960, 50 y.o.   MRN: [id number]  HPI Here for a  Number of issues. First she has had urgency and frequency of urinations off and on for about a year. No burning or pain. She has had several rounds of antibiotics. She has seen Dr. Brunilda Payor in the past and will be seeing him again in the near future. Also she asks if she can take a mild diuretic for the feet swelling that she gets from her Norvasc. This has helped in the past. Lastly she has had 3 days of redness, burning, and discharge from the left eye. No fever or URI symptoms. No contact lenses.    Review of Systems  Constitutional: Negative.   HENT: Negative.   Eyes: Positive for discharge, redness and itching. Negative for pain and visual disturbance.  Respiratory: Negative.   Cardiovascular: Positive for leg swelling. Negative for chest pain and palpitations.  Genitourinary: Positive for urgency and frequency. Negative for flank pain and pelvic pain.       Objective:   Physical Exam  Constitutional:       obese  HENT:  Right Ear: External ear normal.  Left Ear: External ear normal.  Nose: Nose normal.  Mouth/Throat: Oropharynx is clear and moist.  Eyes: Pupils are equal, round, and reactive to light. Right eye exhibits no discharge. Left eye exhibits no discharge.       The lefyt conjunctiva is pink  Neck: Normal range of motion. Neck supple. No thyromegaly present.  Cardiovascular: Normal rate, regular rhythm, normal heart sounds and intact distal pulses.   Pulmonary/Chest: Effort normal and breath sounds normal.  Musculoskeletal:       2+ edema to both lower legs   Lymphadenopathy:    She has no cervical adenopathy.          Assessment & Plan:  Add HCTZ daily for the edema. Check a BMET and A1c  today. Try samples of Vesicare for the overactive bladder, and see Urology. Use Tobramycin for the eye

## 2010-12-19 NOTE — Progress Notes (Signed)
Left voice message for pt to return my call.

## 2010-12-20 ENCOUNTER — Telehealth: Payer: Self-pay | Admitting: Family Medicine

## 2010-12-20 ENCOUNTER — Telehealth: Payer: Self-pay | Admitting: Internal Medicine

## 2010-12-20 MED ORDER — POTASSIUM CHLORIDE 20 MEQ PO PACK: 20.0000 meq | PACK | Freq: Every day | ORAL | Status: DC

## 2010-12-20 NOTE — Telephone Encounter (Signed)
[name], this pt is returning your call. Please call her cell [phone] and leave a very detailed message so she knows everything. Then she'll call you back on her lunch if necessary.

## 2010-12-20 NOTE — Telephone Encounter (Signed)
Spoke with pt and gave lab results, also sent script e-scribe.

## 2010-12-20 NOTE — Telephone Encounter (Signed)
Spoke with pt and called in script. 

## 2010-12-20 NOTE — Telephone Encounter (Signed)
Message copied by Baldemar Friday on Thu Dec 20, 2010 12:54 PM ------      Message from: Gershon Crane A      Created: Tue Dec 18, 2010  7:24 AM       She has mild diabetes, so she needs to restrict her carbohydrate intake. Her potassium is low, so call in Klor-con 20 mEq once a day, #30 with 2 rf. She needs to follow up with Dr. Fabian Sharp in one month.

## 2011-01-01 ENCOUNTER — Ambulatory Visit: Payer: Managed Care, Other (non HMO) | Admitting: Internal Medicine

## 2011-01-15 ENCOUNTER — Ambulatory Visit (INDEPENDENT_AMBULATORY_CARE_PROVIDER_SITE_OTHER): Payer: Managed Care, Other (non HMO) | Admitting: Internal Medicine

## 2011-01-15 ENCOUNTER — Encounter: Payer: Self-pay | Admitting: Internal Medicine

## 2011-01-15 VITALS — BP 150/98 | HR 78 | Wt 306.0 lb

## 2011-01-15 DIAGNOSIS — E876 Hypokalemia: Secondary | ICD-10-CM

## 2011-01-15 DIAGNOSIS — M545 Low back pain, unspecified: Secondary | ICD-10-CM

## 2011-01-15 DIAGNOSIS — E669 Obesity, unspecified: Secondary | ICD-10-CM

## 2011-01-15 DIAGNOSIS — E1169 Type 2 diabetes mellitus with other specified complication: Secondary | ICD-10-CM | POA: Insufficient documentation

## 2011-01-15 DIAGNOSIS — R351 Nocturia: Secondary | ICD-10-CM

## 2011-01-15 DIAGNOSIS — E119 Type 2 diabetes mellitus without complications: Secondary | ICD-10-CM

## 2011-01-15 DIAGNOSIS — I10 Essential (primary) hypertension: Secondary | ICD-10-CM

## 2011-01-15 DIAGNOSIS — I701 Atherosclerosis of renal artery: Secondary | ICD-10-CM

## 2011-01-15 LAB — BASIC METABOLIC PANEL
BUN: 11 mg/dL (ref 6–23)
Calcium: 8.8 mg/dL (ref 8.4–10.5)
GFR: 82.76 mL/min (ref >60.00)
Glucose, Bld: 136 mg/dL — ABNORMAL HIGH (ref 70–99)
Sodium: 138 mEq/L (ref 135–145)

## 2011-01-15 MED ORDER — METFORMIN HCL ER 500 MG PO TB24: 500.0000 mg | ORAL_TABLET | Freq: Every day | ORAL | Status: DC

## 2011-01-15 NOTE — Patient Instructions (Addendum)
You need to follow up on evaluation on sleep apnea You are exhausted  And sleep deprived.  ? Follow up with urology. You are now diabetic range  NO sugar drinks. Add metformin once a day.  Will notify you  of labs when available. Your blood pressure is up and we may need to start  On a different. Medication  Stay on the  Potassium until further notice.   May need to stop the  Current diuretic.

## 2011-01-15 NOTE — Progress Notes (Signed)
Subjective:    Patient ID: ROSAISELA Park, female    DOB: 1960/09/24, 50 y.o.   MRN: [id number]  HPI Patient comes in today for followup after seeing Dr. Clent Ridges couple weeks ago for possible leg swelling. He put her on a low dose diuretic noted that she had a low potassium and put her on potassium. Since that time she thinks her legs are a little bit better has no chest pain new shortness of breath has continued severe nocturia. No worse and she's been on low-dose hydrochlorothiazide. She is up all night using the restroom denies choking by his been recommended to have a sleep evaluation over the last year or 2 and has not followed through for whatever reason. He is very tired and fatigued. He is not having vomiting diarrhea or taking any cathartics. Blood pressure no change on amlodipine history of side effects to multiple medicines in the past.    Review of Systems As per history of present illness no fever weight loss vision changes unusual rashes history. Has changed some of her eating habits. No UTI symptoms. Continues to work full time but is exhausted.  Evaluation by Dr. Jearld Pies  9 11 last year   follow up never happened  Past history family history social history reviewed in the electronic medical record. .   Objective:   Physical Exam WDWN looks seep deprived and tired in nad  HEENT at  Op clar  Neck no masses of bruits Chest:  Clear to A&P without wheezes rales or rhonchi CV:  S1-S2 no gallops or murmurs peripheral perfusion is normal Abdomen:  Sof,t normal bowel sounds without hepatosplenomegaly, no guarding rebound or masses no CVA tenderness No clubbing cyanosis or trc edema  Neuro non focal  Lab Results  Component Value Date   WBC 5.4 09/09/2008   HGB 13.7 09/09/2008   HCT 39.4 09/09/2008   PLT 365.0 09/09/2008   CHOL 184 09/08/2009   TRIG 105.0 09/08/2009   HDL 48.30 09/08/2009   ALT 20 05/27/2008   AST 22 05/27/2008   NA 137 12/17/2010   K 2.9* 12/17/2010   CL 98 12/17/2010     CREATININE 0.9 12/17/2010   BUN 12 12/17/2010   CO2 29 12/17/2010   TSH 1.11 12/17/2010   INR 1.0 RATIO 07/08/2006   HGBA1C 7.2* 12/17/2010   MICROALBUR 2.2* 08/11/2007     Record review  In EHR       Assessment & Plan:  Fatigue  On going  Combo cause   Encourage her to  Absolutely  get the sleep evaluation !!!!  counseled at length about health risk and sx related to OSA .   HT uncontrolled with hx of same over time  and personal intolerance to various regimens initially she was hesitant to take almost anything .  And her concerns have been a barrier to complete evaluation and intervention  . See Dr Kathlyn Sacramento note apparently had neg renal angiography in the past ( 2007) .  Echo  Some LVH no local hypokinesis or pulm HT ; causes  for secondary hypertension.   ( more than OSA  Which I think she has by hx and context.) I think she actually will follow through at this point  Check labs today and make referral opinion.   I would expect hyperaldo most likely .  Given spirono in the past? Nausea? Or never took .     Hypokalemia   NOT ON DIURETIC    .  Had  low K pre put on diuretic  Has nocturia but not necessarily excess water drinking.    New onset diabetes.  . Counseled about importance of intervention now,. Adding meds etc   Expectant management. And risk.  Severe nocturia.   Not related to diuretic  First visit with urologist " didn't do any thing"     Total visit > 50% spent counseling and coordinating care  Old record review

## 2011-01-23 NOTE — Assessment & Plan Note (Signed)
Getting worse  Has seen uro

## 2011-01-23 NOTE — Assessment & Plan Note (Signed)
Disc eval with patient .   pra aldo ration and labs today

## 2011-01-23 NOTE — Assessment & Plan Note (Signed)
Neg angiography.

## 2011-01-23 NOTE — Assessment & Plan Note (Signed)
Counseled. Begin metformin  And follow up

## 2011-01-23 NOTE — Assessment & Plan Note (Signed)
HT uncontrolled with hx of same over time  and personal intolerance to various regimens initially she was hesitant to take almost anything .  And her concerns have been a barrier to complete evaluation and intervention  . See Dr Kathlyn Sacramento note apparently had neg renal angiography in the past ( 2007) .  Echo  Some LVH no local hypokinesis or pulm HT ; causes  for secondary hypertension.   ( more than OSA  Which I think she has by hx and context.) I think she actually will follow through at this point  Check labs today and make referral opinion.   I would expect hyperaldo most likely .  Given spirono in the past? Nausea? Or never took .

## 2011-01-24 ENCOUNTER — Telehealth: Payer: Self-pay | Admitting: *Deleted

## 2011-01-24 MED ORDER — POTASSIUM CHLORIDE 20 MEQ PO PACK: 40.0000 meq | PACK | Freq: Every day | ORAL | Status: DC

## 2011-01-24 NOTE — Telephone Encounter (Signed)
Message copied by Romualdo Bolk on Thu Jan 24, 2011 10:27 AM ------      Message from: [facility] [place] K      Created: Wed Jan 23, 2011  6:21 PM       Advise patient that  Her potassium is still low 2.9  She needs to stop the diuretic if she hasn't already . Increase her potassium  Dose  To twice a day ( call in new rx )       One of her lab tests are not back. But I reviewed   her record . At this time am going to  have her see an endocrinology specialist    To see an excess hormone from her adrenal gland could be causing  Her kidneys to cause the low potassium ( hyperaldosteronism).             Still keep her appt with me an look  Into  Sleep apnea.

## 2011-01-24 NOTE — Telephone Encounter (Signed)
Left message to call back. Order sent to [facility] for endocrine.

## 2011-01-28 NOTE — Telephone Encounter (Signed)
Left message to call back  

## 2011-01-30 ENCOUNTER — Telehealth: Payer: Self-pay | Admitting: *Deleted

## 2011-01-30 NOTE — Telephone Encounter (Signed)
Message copied by Romualdo Bolk on Wed Jan 30, 2011  4:56 PM ------      Message from: [facility] [place] K      Created: Tue Jan 29, 2011  5:17 PM       The last test is back and does show  Increase aldosterone secretion in her body . That could be causing th potassium problem  Send copy of lab to [facility] .

## 2011-01-30 NOTE — Telephone Encounter (Signed)
Left message to call back  

## 2011-02-01 LAB — DIFFERENTIAL
Eosinophils Absolute: 0.2
Eosinophils Relative: 3
Lymphs Abs: 2.5
Monocytes Absolute: 0.5
Monocytes Relative: 7

## 2011-02-01 LAB — CBC
HCT: 39.3
MCV: 91.6
RBC: 4.29
WBC: 7.6

## 2011-02-01 LAB — URINALYSIS, ROUTINE W REFLEX MICROSCOPIC
Glucose, UA: NEGATIVE
Hgb urine dipstick: NEGATIVE
Specific Gravity, Urine: 1.009
pH: 6.5

## 2011-02-01 LAB — POCT I-STAT, CHEM 8
Creatinine, Ser: 1
Glucose, Bld: 131 — ABNORMAL HIGH
Hemoglobin: 13.9
TCO2: 28

## 2011-02-01 LAB — POCT CARDIAC MARKERS: CKMB, poc: 2

## 2011-02-05 NOTE — Telephone Encounter (Signed)
Left message to call back  

## 2011-02-07 NOTE — Telephone Encounter (Signed)
Pt aware of results 

## 2011-02-11 ENCOUNTER — Ambulatory Visit: Payer: Managed Care, Other (non HMO) | Admitting: Endocrinology

## 2011-02-12 ENCOUNTER — Ambulatory Visit: Payer: Managed Care, Other (non HMO) | Admitting: Endocrinology

## 2011-02-25 ENCOUNTER — Ambulatory Visit (INDEPENDENT_AMBULATORY_CARE_PROVIDER_SITE_OTHER): Payer: Managed Care, Other (non HMO) | Admitting: Endocrinology

## 2011-02-25 ENCOUNTER — Ambulatory Visit: Payer: Managed Care, Other (non HMO)

## 2011-02-25 ENCOUNTER — Encounter: Payer: Self-pay | Admitting: Endocrinology

## 2011-02-25 VITALS — BP 166/92 | HR 78 | Temp 98.3°F | Ht 67.0 in | Wt 308.0 lb

## 2011-02-25 DIAGNOSIS — E876 Hypokalemia: Secondary | ICD-10-CM

## 2011-02-25 DIAGNOSIS — E269 Hyperaldosteronism, unspecified: Secondary | ICD-10-CM

## 2011-02-25 LAB — BASIC METABOLIC PANEL
Chloride: 103 mEq/L (ref 96–112)
Creatinine, Ser: 0.8 mg/dL (ref 0.4–1.2)
Potassium: 3.4 mEq/L — ABNORMAL LOW (ref 3.5–5.1)

## 2011-02-25 MED ORDER — DEXAMETHASONE 1 MG PO TABS: ORAL_TABLET | ORAL | Status: DC

## 2011-02-25 NOTE — Patient Instructions (Addendum)
you should do a "dexamethasone suppression test."  for this, you would take dexamethasone 1 mg at 10 pm, then come in for a "cortisol" blood test the next morning before 9 am.  you do not need to be fasting for this test. let's also check a ct of the adrenal glands.  you will receive a phone call, about a day and time for an appointment. tests are being requested for you today.  please call [phone] to hear each of your test results.  You will be prompted to enter the 9-digit "MRN" number that appears at the top left of this page, followed by #.  Then you will hear the message.

## 2011-02-25 NOTE — Progress Notes (Signed)
Subjective:    Patient ID: [name], female    DOB: [birth year], 50 y.o.   MRN: [id number]  HPI Pt has had htn x 18 years.  She has had hypokalemia x approx 1 year, after hctz was started.  However, it has persisted after discontinuation of it.   Pt states many years of moderate swelling of the legs, and assoc nocturia. Past Medical History  Diagnosis Date  . Hypertension     x many years (since birht of her son at 71) Had workup with renal artery angiogram that showed no evidence for fibromuscular dysplasia ( no significant renal artery stenosis.) ECHO (8/11) EF 60-65%, mild LVH, no regional WMAs.  . Morbid obesity   . Allergic rhinitis   . Diabetes mellitus   . Hyperlipidemia   . Low back pain     Past Surgical History  Procedure Date  . Abdominal hysterectomy     partial for fibroids  . Tubal ligation     History   Social History  . Marital Status: Married    Spouse Name: N/A    Number of Children: N/A  . Years of Education: N/A   Occupational History  . Not on file.   Social History Main Topics  . Smoking status: Never Smoker   . Smokeless tobacco: Never Used  . Alcohol Use: No  . Drug Use: No  . Sexually Active: Not on file   Other Topics Concern  . Not on file   Social History Narrative   MarriedRegular exercise-yesHH of 4No petsWorks at [employer]    Current Outpatient Prescriptions on File Prior to Visit  Medication Sig Dispense Refill  . amLODipine (NORVASC) 10 MG tablet Take 10 mg by mouth daily. 1/2 - 1 daily      . potassium chloride (KLOR-CON) 20 MEQ packet Take 40 mEq by mouth daily.  60 tablet  2  . hydrochlorothiazide 25 MG tablet Take 1 tablet (25 mg total) by mouth daily.  30 tablet  2  . metFORMIN (GLUCOPHAGE-XR) 500 MG 24 hr tablet Take 1 tablet (500 mg total) by mouth daily with breakfast.  30 tablet  2  . solifenacin (VESICARE) 10 MG tablet Take 1 tablet (10 mg total) by mouth daily.  14 tablet  0    Allergies    Allergen Reactions  . Candesartan Cilexetil   . Ibuprofen   . Telmisartan     Family History  Problem Relation Age of Onset  . Cancer Mother   . Hypertension Mother   . Other Father     died in MVA    BP 166/92  Pulse 78  Temp(Src) 98.3 F (36.8 C) (Oral)  Ht 5\' 7"  (1.702 m)  Wt 308 lb (139.708 kg)  BMI 48.24 kg/m2  SpO2 96%  Review of Systems denies headache, hirsutism, excessive diaphoresis, daytime polyuria, sob, hyperpigmentation, cramps, numbness, easy bruising, muscle weakness, depression, and rash on the abdomen.  He reports weight gain.  She menstruated until tah approx 2007.  She attributes insomnia to nocturia.      Objective:   Physical Exam VS: see vs page GEN: no distress.  Obese. HEAD: head: no deformity eyes: no periorbital swelling, no proptosis external nose and ears are normal mouth: no lesion seen NECK: supple, thyroid is not enlarged CHEST WALL: no deformity LUNGS:  Clear to auscultation CV: reg rate and rhythm, no murmur ABD: abdomen is soft, nontender.  no hepatosplenomegaly.  not distended.  no hernia MUSCULOSKELETAL: muscle  bulk and strength are grossly normal.  no obvious joint swelling.  gait is normal and steady EXTEMITIES: no deformity.  no ulcer on the feet.  feet are of normal color and temp.  2+ leg edema PULSES: dorsalis pedis intact bilat.   NEURO:  cn 2-12 grossly intact.   readily moves all 4's.  sensation is intact to touch on the feet SKIN:  Normal texture and temperature.  No rash or suspicious lesion is visible.  No abd striae. NODES:  None palpable at the neck PSYCH: alert, oriented x3.  Does not appear anxious nor depressed.    (i reviewed bmet/renin/aldosterone)    Assessment & Plan:  Hyperaldosteronism (relative to renin), uncertain etiology.  This causes a high risk of heart and renal dz, so i agree with dr Fabian Sharp that this should be controlled, rather than just controlling bp and hypokalemia. Hypokalemia, prob due to  #1 Htn, prob due to #1 Nocturia, due to edema Chronic weight gain--cushing's should be excluded.

## 2011-02-26 LAB — CORTISOL: Cortisol, Plasma: 6.6 ug/dL

## 2011-02-27 ENCOUNTER — Other Ambulatory Visit: Payer: Managed Care, Other (non HMO)

## 2011-03-05 LAB — ALDOSTERONE + RENIN ACTIVITY W/ RATIO
ALDO / PRA Ratio: 122.2 Ratio — ABNORMAL HIGH (ref 0.9–28.9)
Aldosterone: 22 ng/dL

## 2011-03-12 ENCOUNTER — Telehealth: Payer: Self-pay | Admitting: *Deleted

## 2011-03-12 NOTE — Telephone Encounter (Signed)
i need to see the denial from insurance

## 2011-03-12 NOTE — Telephone Encounter (Signed)
Denial from insurance company placed on MD's desk to review.

## 2011-03-12 NOTE — Telephone Encounter (Signed)
Pt states that insurance will not cover CT scan and wants to know what the next step is.

## 2011-03-13 ENCOUNTER — Encounter: Payer: Self-pay | Admitting: *Deleted

## 2011-03-13 MED ORDER — SPIRONOLACTONE 50 MG PO TABS: 50.0000 mg | ORAL_TABLET | Freq: Every day | ORAL | Status: DC

## 2011-03-13 NOTE — Telephone Encounter (Signed)
please call patient: Ins company denied ct scan, because blood tests were not abnormal enough.   i have sent a prescription to your pharmacy, to help the potassium and blood pressure. Please come back for a follow-up appointment in 1-2 weeks

## 2011-03-13 NOTE — Telephone Encounter (Signed)
Pt wants to know why her potassium level is low (what's causing it to be low) and how spironolactone will help with her potassium levels.

## 2011-03-13 NOTE — Telephone Encounter (Signed)
Left message for pt to callback office.  

## 2011-03-13 NOTE — Telephone Encounter (Signed)
Addended by: Romero Belling on: 03/13/2011 08:30 AM   Modules accepted: Orders

## 2011-03-13 NOTE — Telephone Encounter (Signed)
A user error has taken place: encounter opened in error, closed for administrative reasons.

## 2011-03-14 NOTE — Telephone Encounter (Signed)
Your aldosterone is low.  Reason is uncertain.

## 2011-03-14 NOTE — Telephone Encounter (Signed)
Pt informed of MD's advisement. 

## 2011-03-14 NOTE — Telephone Encounter (Signed)
Left message for pt to callback office.  

## 2011-05-21 ENCOUNTER — Telehealth: Payer: Self-pay | Admitting: Internal Medicine

## 2011-05-21 MED ORDER — AMLODIPINE BESYLATE 10 MG PO TABS: ORAL_TABLET | ORAL | Status: DC

## 2011-05-21 NOTE — Telephone Encounter (Signed)
amLODipine (NORVASC) 10 MG tablet..the patient wants a refill on this. Asked if she called the pharmacy, she said she did but it is not their responsibility to follow up with it.

## 2011-05-21 NOTE — Telephone Encounter (Signed)
Rx sent to pharmacy   

## 2011-07-03 ENCOUNTER — Other Ambulatory Visit: Payer: Self-pay | Admitting: Internal Medicine

## 2011-07-03 NOTE — Telephone Encounter (Signed)
Pt is out of med

## 2011-08-29 ENCOUNTER — Other Ambulatory Visit: Payer: Self-pay | Admitting: Internal Medicine

## 2011-09-06 ENCOUNTER — Telehealth: Payer: Self-pay | Admitting: Internal Medicine

## 2011-09-06 NOTE — Telephone Encounter (Signed)
Needs ov or more information

## 2011-09-06 NOTE — Telephone Encounter (Signed)
Patient called stating that she has the pink eye and would like to have something called in. Please advise.

## 2011-09-06 NOTE — Telephone Encounter (Signed)
Left a message for pt to return call 

## 2011-09-06 NOTE — Telephone Encounter (Signed)
Called pt to get more information.  Left a message for return call.  Pls advise.

## 2011-09-20 ENCOUNTER — Other Ambulatory Visit: Payer: Self-pay | Admitting: Internal Medicine

## 2011-10-02 ENCOUNTER — Encounter: Payer: Self-pay | Admitting: Internal Medicine

## 2011-10-02 ENCOUNTER — Ambulatory Visit (INDEPENDENT_AMBULATORY_CARE_PROVIDER_SITE_OTHER): Payer: Managed Care, Other (non HMO) | Admitting: Internal Medicine

## 2011-10-02 DIAGNOSIS — R351 Nocturia: Secondary | ICD-10-CM

## 2011-10-02 DIAGNOSIS — E1169 Type 2 diabetes mellitus with other specified complication: Secondary | ICD-10-CM

## 2011-10-02 DIAGNOSIS — R600 Localized edema: Secondary | ICD-10-CM

## 2011-10-02 DIAGNOSIS — R609 Edema, unspecified: Secondary | ICD-10-CM

## 2011-10-02 DIAGNOSIS — E669 Obesity, unspecified: Secondary | ICD-10-CM

## 2011-10-02 DIAGNOSIS — G4730 Sleep apnea, unspecified: Secondary | ICD-10-CM

## 2011-10-02 DIAGNOSIS — E876 Hypokalemia: Secondary | ICD-10-CM

## 2011-10-02 DIAGNOSIS — R7309 Other abnormal glucose: Secondary | ICD-10-CM

## 2011-10-02 DIAGNOSIS — E119 Type 2 diabetes mellitus without complications: Secondary | ICD-10-CM

## 2011-10-02 DIAGNOSIS — I10 Essential (primary) hypertension: Secondary | ICD-10-CM

## 2011-10-02 DIAGNOSIS — E269 Hyperaldosteronism, unspecified: Secondary | ICD-10-CM

## 2011-10-02 NOTE — Progress Notes (Signed)
Subjective:    Patient ID: [name], female    DOB: 1961-03-10, 51 y.o.   MRN: [id number]  HPI  Comein today for an acute visit work in because her blood pressure is not controlled.  She has had high blood pressure never well controlled for a number of years. She states that she seen a number of specialists including the kidney doctor or the endocrinologist and the heart Dr. And tests are gone but her blood pressure isn't controlled and she has swelling in her feet. She does not think the Norvasc is helping Swelling is a lot worse on norvasc and  When placed onaldactone . She had nausea and felt bad so she stopped it. Unfortunately she did not followup with anyone to stop the medicine and didn't come in. She is still taking the potassium 20 twice a day and some magnesium occasionally. She restarted her hydrochlorothiazide I suppose that was left over the last few days. Her blood pressure at work was in the 170 range. The nurse there said that she needs to be on different medicines and her blood pressure isn't controlled. She still hasn't gotten evaluation for sleep apnea as I discussed her of the last year or 2. She states she cannot take any medicine that makes her drowsy or sleep because she has to be alert for her job. She has had many side effects to many medications in the past some of these are documented in the old record I do not have access easily to. ? Paper  She states that her blood sugar is better was 110 fasting she's able to get it done at work she is not taking the metformin.  ? Why   Review of Systems She has continued fatigue nocturia swelling in her legs blood pressure is up. No cp change in sob  Syncope .  Her brother has obstructive sleep apnea and an appliance. Past history family history social history reviewed in the electronic medical record.     Objective:   Physical Exam BP 168/90  Pulse 87  Temp(Src) 97.5 F (36.4 C) (Oral)  Wt 301 lb (136.533 kg)   SpO2 95% Well-developed well-nourished tired-appearing sleep deprived looking female in no acute distress Neck supple without masses or adenopathy chest CTAP is equal cardiac S1-S2 no gallops murmurs Extremities +2 edema on the feet and pretibial areas no acute rashes  Data reviewed     Assessment & Plan:  Hypertension out of control associated with hypokalemia possible hyperaldosteronism Patient has side effects from many medicines has been inconsistently adherent to followup for whatever reason.When she gets a side effect of medicine and stops it she doesn't call or come back in 1 tends to come in in a acute situation such as today.She states she cannot take clonidine if it makes her drowsy which was suggested today.  Saw Dr. Shirlee Latch in 2011 echo showed LVH and some mild apical hypokinesis. She was put on Norvasc but apparently didn't have any followup visits. Phone  Hyperglycemia early diabetes :  Lost to followupshe states that her blood sugars are good checked at work I recommend she check her blood sugars 2 hours after eating we'll get an A1c today. She has chosen to go off medication. Irregular sleep with nocturia looks terribly sleep deprived:  I discussed with her for a long time about getting evaluated for sleep apnea and she just hasn't done that. She states that with the re\re referral she will go to sleep specialist to  discuss this. They do not think she is a candidate for testing then that would be fine. Discussed at length if she has untreated sleep apnea of the seriousness of that problem.  She has edema this is not new worse with the Norvasc.  Would like to give her a stronger diuretic however need to check her potassium and magnesium today to avoid severe hypokalemia.  Her situation is difficult to manage because of her history of side effects and no regular followup when something doesn't work. I suppose some of this is because of her job situation. Indeed her blood pressure is  up at work and I am not surprised. Is unfortunate that meds medications at the have tried have caused the side effect.  Would even consider sending her to a hypertension clinic but I'm not sure that she would follow through.   I will have to review the record that I can see to make a recommendation for her after her labs are back. Total visit 40 mins > 50% spent counseling and coordinating care   See results note  Disc with dr Everardo All  Trial of lower dose spirono 25 to see if she tolerates this and then potassium check in 2 weeks and ov with him in 1  Month  To see me in 2-3 months

## 2011-10-02 NOTE — Patient Instructions (Addendum)
I will need to review your record to see what medicines have been used to treat her blood pressure. The spironolactone may have been very effective although I understand you couldn't take it for side effects.  Many medicines have side effects some we may have to tolerate  If they work.  I am still concerned that you could have significant sleep apnea which will explain fatigue  Urinary frequency swelling in your legs potential for heart failure and inability to control your blood pressure.  I agree that the Norvasc is not adequate enough for your blood pressure control. Clonidine may be helpful but could cause drowsiness.  We will do laboratory testing today and then make a plan..  I can really refer to pulmonary sleep if you  will go to the appointment and follow through.

## 2011-10-03 LAB — T4, FREE: Free T4: 0.73 ng/dL (ref 0.60–1.60)

## 2011-10-03 LAB — TSH: TSH: 0.92 u[IU]/mL (ref 0.35–5.50)

## 2011-10-03 LAB — HEMOGLOBIN A1C: Hgb A1c MFr Bld: 7.2 % — ABNORMAL HIGH (ref 4.6–6.5)

## 2011-10-03 LAB — BASIC METABOLIC PANEL
CO2: 30 mEq/L (ref 19–32)
Calcium: 8.7 mg/dL (ref 8.4–10.5)
Sodium: 141 mEq/L (ref 135–145)

## 2011-10-04 NOTE — Progress Notes (Signed)
Quick Note:  Called pt's home number- no vm set up. Will attempt to call at a later time. ______

## 2011-10-05 DIAGNOSIS — G4730 Sleep apnea, unspecified: Secondary | ICD-10-CM | POA: Insufficient documentation

## 2011-10-05 NOTE — Assessment & Plan Note (Signed)
a1c 7.2 pt following and has chosen to not take the metformin

## 2011-10-07 ENCOUNTER — Ambulatory Visit: Payer: Managed Care, Other (non HMO) | Admitting: Physician Assistant

## 2011-10-08 ENCOUNTER — Telehealth: Payer: Self-pay | Admitting: Family Medicine

## 2011-10-08 NOTE — Telephone Encounter (Signed)
Pt called - was here last week and had labs drawn. She is wanting the results, especially the K+. Please call, because based on the K+, [facility] was going to possibly put pt on diuretic. Came in last week for elev. BP, and it is STILL up, but [facility] did not put her on anything last week for this.

## 2011-10-08 NOTE — Telephone Encounter (Signed)
Attempted to call pt but number was busy. 

## 2011-10-08 NOTE — Progress Notes (Signed)
Quick Note:  Left a message for pt to return call. ______ 

## 2011-10-08 NOTE — Telephone Encounter (Signed)
Left a message for pt to return call 

## 2011-10-08 NOTE — Telephone Encounter (Signed)
Please see my result note  Unfortunately we have not been able to get in touch with her. Keep trying

## 2011-10-08 NOTE — Telephone Encounter (Signed)
Also still has swelling in legs.

## 2011-10-09 NOTE — Telephone Encounter (Signed)
[name] attempted to call pt under lab note; left a message for return call.

## 2011-10-11 ENCOUNTER — Encounter: Payer: Self-pay | Admitting: Internal Medicine

## 2011-10-23 ENCOUNTER — Telehealth: Payer: Self-pay | Admitting: Internal Medicine

## 2011-10-23 MED ORDER — SPIRONOLACTONE 25 MG PO TABS: 25.0000 mg | ORAL_TABLET | Freq: Every day | ORAL | Status: DC

## 2011-10-23 NOTE — Telephone Encounter (Signed)
Pt called and said that she thought Dr Fabian Sharp was suppose to be calling in a decreased dose for spironolactone (ALDACTONE). Pt usually takes 50mg , but this was supposed to be changed to 25 mg a few wks ago, but nothing has been called in to pharmacy yet. [facility] on [address] of [address]. Pls call in spironolactone 25 mg asap today.    Also pt says that amLODipine (NORVASC) 10 MG tablet is causing swelling in legs and feet and it is not helping pts bp at all.

## 2011-10-23 NOTE — Telephone Encounter (Signed)
I was unaware that the rx was not sent in . I asked for this to be done on my message from May although we had a difficult  time contacting her.   Will send in  Now It is important that she gets a  potassium level done in 2 weeks and then fu with Dr Everardo All in a month after starting.  Also this med may help her swelling.

## 2011-10-23 NOTE — Telephone Encounter (Signed)
Please read and advise

## 2011-10-24 ENCOUNTER — Other Ambulatory Visit: Payer: Self-pay | Admitting: Family Medicine

## 2011-10-24 ENCOUNTER — Institutional Professional Consult (permissible substitution): Payer: Managed Care, Other (non HMO) | Admitting: Pulmonary Disease

## 2011-10-24 DIAGNOSIS — E876 Hypokalemia: Secondary | ICD-10-CM

## 2011-10-24 MED ORDER — SPIRONOLACTONE 25 MG PO TABS: 25.0000 mg | ORAL_TABLET | Freq: Every day | ORAL | Status: DC

## 2011-10-24 NOTE — Telephone Encounter (Signed)
Form filled out and on [facility] desk for signature 

## 2011-10-24 NOTE — Telephone Encounter (Signed)
Spoke to the pt.  Sent her Rx to [facility] and a reduced mg per [facility].  Informed her to have labs done in 2 weeks and then follow up with Dr. Rennis Harding in 1 month.

## 2012-02-11 ENCOUNTER — Ambulatory Visit: Payer: Managed Care, Other (non HMO)

## 2012-03-05 ENCOUNTER — Other Ambulatory Visit: Payer: Self-pay | Admitting: Internal Medicine

## 2012-03-05 MED ORDER — AMLODIPINE BESYLATE 10 MG PO TABS: 10.0000 mg | ORAL_TABLET | Freq: Every day | ORAL | Status: DC

## 2012-04-07 ENCOUNTER — Other Ambulatory Visit: Payer: Self-pay | Admitting: Internal Medicine

## 2012-04-07 MED ORDER — AMLODIPINE BESYLATE 10 MG PO TABS: 10.0000 mg | ORAL_TABLET | Freq: Every day | ORAL | Status: DC

## 2012-04-22 ENCOUNTER — Ambulatory Visit (INDEPENDENT_AMBULATORY_CARE_PROVIDER_SITE_OTHER): Payer: Managed Care, Other (non HMO) | Admitting: Family Medicine

## 2012-04-22 ENCOUNTER — Encounter: Payer: Self-pay | Admitting: Family Medicine

## 2012-04-22 VITALS — BP 162/88 | Temp 99.9°F | Wt 310.0 lb

## 2012-04-22 DIAGNOSIS — B9789 Other viral agents as the cause of diseases classified elsewhere: Secondary | ICD-10-CM

## 2012-04-22 DIAGNOSIS — B349 Viral infection, unspecified: Secondary | ICD-10-CM

## 2012-04-22 DIAGNOSIS — R52 Pain, unspecified: Secondary | ICD-10-CM

## 2012-04-22 LAB — POCT INFLUENZA A/B: Influenza B, POC: NEGATIVE

## 2012-04-22 MED ORDER — HYDROCODONE-HOMATROPINE 5-1.5 MG/5ML PO SYRP: 5.0000 mL | ORAL_SOLUTION | Freq: Four times a day (QID) | ORAL | Status: AC | PRN

## 2012-04-22 NOTE — Progress Notes (Signed)
  Subjective:    Patient ID: [name], female    DOB: 1960/07/26, 51 y.o.   MRN: [id number]  HPI Acute visit. Patient had onset yesterday of chills, nonproductive cough, low-grade fever. She had some nausea yesterday with one episode of vomiting. No diarrhea. She's had some body aches. One coworker with similar symptoms. Denies any abdominal pain. No dysuria. Only mild headaches.  Multiple chronic problems including morbid obesity, hypertension, possible obstructive sleep apnea, and type 2 diabetes. Poor history of compliance with followup. Currently only taking amlodipine for hypertension. Blood pressures have been difficult to control. Patient is a nonsmoker   Review of Systems  Constitutional: Positive for fever and chills.  HENT: Positive for congestion and sore throat.   Respiratory: Positive for cough.   Cardiovascular: Negative for chest pain.  Gastrointestinal: Positive for nausea. Negative for diarrhea.  Genitourinary: Negative for dysuria.       Objective:   Physical Exam  Constitutional: She appears well-developed and well-nourished.  HENT:  Right Ear: External ear normal.  Left Ear: External ear normal.       Small aphthous type ulcer left tonsillar region. No exudate  Neck: Neck supple.  Cardiovascular: Normal rate and regular rhythm.   Pulmonary/Chest: Effort normal and breath sounds normal. No respiratory distress. She has no wheezes. She has no rales.  Lymphadenopathy:    She has no cervical adenopathy.          Assessment & Plan:  Probable viral syndrome. Check influenza screen. If negative, treat symptomatically. If positive, consider Tamiflu. Patient needs close followup with primary to reassess blood pressure  Influenza screen negative. Hycodan cough syrup for night use prn.

## 2012-04-30 ENCOUNTER — Telehealth: Payer: Self-pay | Admitting: Internal Medicine

## 2012-04-30 NOTE — Telephone Encounter (Signed)
Pt went back to urgent Care and they called her in script right away. No need anymore.

## 2012-04-30 NOTE — Telephone Encounter (Signed)
[facility] Triage Call Report Triage Record Num: [id number] Operator: Thayer Headings Patient Name: [name] Call Date & Time: 04/28/2012 2:49:57PM Patient Phone: [phone] PCP: Berniece Andreas Patient Gender: Female PCP Fax : [phone] Patient DOB: 06-Oct-1960 [facility] Name[facility]  Reason for Call: Caller: [name]/Patient; PCP: Evelena Peat (Family Practice); CB#: [phone]; Calling today 04/28/12 regarding has bronchitis and is currently taking Z Pack and Prednisone. Said Z Pack is causing her to have diarrhea and wants to know if MD will call in a different antibiotic. Triager advised that office will not call in antibiotics, office visit is required. Offered triage assessment of symptoms. Pt declined. Wanted to know which medication would be causing the diarrhea. Advised caller the Z Pack is probably causing the diarrhea. It is not an allergic reaction, just a side effect. Advised yogurt with active cultures. Pt verbalized understanding.

## 2012-04-30 NOTE — Telephone Encounter (Signed)
Pt had to go to Urgent Care over the weekend because she was no better and we were closed. Diagnosed w/ bronchitis They gave her Zpak, but pt cannot take due to it making her sick. Pt would like to know if you could call her in another antibiotic.[name] Pt states she could not take the cough med MD prescribed, allergic to codeine.  Pharm: [facility] [address]

## 2012-06-02 ENCOUNTER — Encounter: Payer: Self-pay | Admitting: Pulmonary Disease

## 2012-06-02 ENCOUNTER — Ambulatory Visit (INDEPENDENT_AMBULATORY_CARE_PROVIDER_SITE_OTHER): Payer: BC Managed Care – PPO | Admitting: Pulmonary Disease

## 2012-06-02 VITALS — BP 150/88 | HR 70 | Temp 97.8°F | Ht 67.0 in | Wt 316.4 lb

## 2012-06-02 DIAGNOSIS — R351 Nocturia: Secondary | ICD-10-CM

## 2012-06-02 DIAGNOSIS — G4730 Sleep apnea, unspecified: Secondary | ICD-10-CM

## 2012-06-02 NOTE — Assessment & Plan Note (Signed)
She has snoring, sleep disruption, witnessed apnea, and daytime sleepiness.  She has history of diabetes and hypertension.  I am concerned she has sleep apnea.  I have explained how sleep apnea can affect the patient's health.  Driving precautions and importance of weight loss were discussed.  Treatment options for sleep apnea were reviewed.  To further assess will arrange for in lab sleep study.

## 2012-06-02 NOTE — Progress Notes (Signed)
Chief Complaint  Patient presents with  . sleep consult    Pt referred by Dr Berniece Andreas    History of Present Illness: [name] is a 52 y.o. female for evaluation of sleep apnea.  She has noticed trouble with her sleep for years.  She was previously seen by Dr. Vassie Loll in 2009 for possible sleep apnea, but she did not follow through with sleep study.  Her snoring has been getting worse, and she is having more trouble feeling sleepy during the day.  As a result she decided to have further sleep assessment.  She goes to bed at 1030, but doesn't fall asleep until midnight.  She wakes up every hour to use the bathroom.  She wakes up feeling like she can't breath, and has to open a window to catch her breath.  Her husband has told her she stops breathing while asleep.  She gets out of bed at 6 am.  She feels tired all day.  She used to drink sodas frequently for the caffeine, but stopped once she learned how much sugar these drinks contain.  She gets headaches at times in the morning.  She has fallen asleep at work.  The patient denies sleep walking, sleep talking, bruxism, or nightmares.  There is no history of restless legs.  The patient denies sleep hallucinations, sleep paralysis, or cataplexy.  Her Epworth score is 1 out of 24.  Tests: Echo 12/11/09 >> mild LVH, EF 60 to 65%  Past Medical History  Diagnosis Date  . Hypertension     x many years (since birht of her son at 7) Had workup with renal artery angiogram that showed no evidence for fibromuscular dysplasia ( no significant renal artery stenosis.) ECHO (8/11) EF 60-65%, mild LVH, no regional WMAs.  . Morbid obesity   . Allergic rhinitis   . Diabetes mellitus   . Hyperlipidemia   . Low back pain     Past Surgical History  Procedure Date  . Abdominal hysterectomy     partial for fibroids  . Tubal ligation     Current Outpatient Prescriptions on File Prior to Visit  Medication Sig Dispense Refill  . amLODipine  (NORVASC) 10 MG tablet Take 1 tablet (10 mg total) by mouth daily.  30 tablet  0  . fish oil-omega-3 fatty acids 1000 MG capsule Take 1 capsule by mouth 3 (three) times daily.        . hydrochlorothiazide (HYDRODIURIL) 25 MG tablet Take 25 mg by mouth as needed.       [name] MAGNESIUM PO Take 1 capsule by mouth daily. 25 mg       . Multiple Vitamin (MULTIVITAMIN) tablet Take 1 tablet by mouth daily.        . potassium chloride SA (K-DUR,KLOR-CON) 20 MEQ tablet TAKE 2 TABLETS BY MOUTH DAILY  60 tablet  1  . [DISCONTINUED] potassium chloride (KLOR-CON) 20 MEQ packet Take 40 mEq by mouth daily.  60 tablet  2    Allergies  Allergen Reactions  . Candesartan Cilexetil   . Codeine     Throat swelling  . Ibuprofen     hives  . Telmisartan   . Zithromax (Azithromycin)     diarrhea     Family History  Problem Relation Age of Onset  . Cancer Mother   . Hypertension Mother   . Other Father     died in MVA    History  Substance Use Topics  . Smoking status:  Never Smoker   . Smokeless tobacco: Never Used  . Alcohol Use: No    Review of Systems  Constitutional: Negative for fever and unexpected weight change.  HENT: Positive for congestion. Negative for ear pain, nosebleeds, sore throat, rhinorrhea, sneezing, trouble swallowing, dental problem, postnasal drip and sinus pressure.   Eyes: Positive for redness and itching.  Respiratory: Positive for cough and wheezing. Negative for chest tightness and shortness of breath.   Cardiovascular: Positive for leg swelling. Negative for palpitations.  Gastrointestinal: Negative for nausea and vomiting.  Genitourinary: Negative for dysuria.  Musculoskeletal: Negative for joint swelling.  Skin: Negative for rash.  Neurological: Positive for headaches.  Hematological: Bruises/bleeds easily.  Psychiatric/Behavioral: Negative for dysphoric mood. The patient is not nervous/anxious.    Physical Exam: Filed Vitals:   06/02/12 1613  BP: 150/88  Pulse:  70  Temp: 97.8 F (36.6 C)  TempSrc: Oral  Height: 5\' 7"  (1.702 m)  Weight: 316 lb 6.4 oz (143.518 kg)  SpO2: 98%    Current Encounter SPO2  06/02/12 1613 98%  04/22/12 1534 97%  10/02/11 1513 95%    Wt Readings from Last 3 Encounters:  06/02/12 316 lb 6.4 oz (143.518 kg)  04/22/12 310 lb (140.615 kg)  10/02/11 301 lb (136.533 kg)    Body mass index is 49.56 kg/(m^2).   General - No distress ENT - No sinus tenderness, MP 4, no oral exudate, no LAN, no thyromegaly, TM clear, pupils equal/reactive Cardiac - s1s2 regular, no murmur, pulses symmetric Chest - No wheeze/rales/dullness, good air entry, normal respiratory excursion Back - No focal tenderness Abd - Soft, non-tender, no organomegaly, + bowel sounds Ext - 1+ ankle edema Neuro - Normal strength, cranial nerves intact Skin - No rashes Psych - Normal mood, and behavior.   Assessment/Plan:  [name], MD  Pulmonary/Critical Care/Sleep Pager:  [phone] 06/02/2012, 4:16 PM

## 2012-06-02 NOTE — Assessment & Plan Note (Signed)
I have explained to her how sleep apnea can contribute to nocturia.

## 2012-06-02 NOTE — Patient Instructions (Signed)
Will arrange for sleep study Will call to schedule follow up after sleep study reviewed 

## 2012-06-02 NOTE — Progress Notes (Deleted)
  Subjective:    Patient ID: [name], female    DOB: [birth year], 52 y.o.   MRN: [id number]  HPI    Review of Systems  Constitutional: Negative for fever and unexpected weight change.  HENT: Positive for congestion. Negative for ear pain, nosebleeds, sore throat, rhinorrhea, sneezing, trouble swallowing, dental problem, postnasal drip and sinus pressure.   Eyes: Positive for redness and itching.  Respiratory: Positive for cough and wheezing. Negative for chest tightness and shortness of breath.   Cardiovascular: Positive for leg swelling. Negative for palpitations.  Gastrointestinal: Negative for nausea and vomiting.  Genitourinary: Negative for dysuria.  Musculoskeletal: Negative for joint swelling.  Skin: Negative for rash.  Neurological: Positive for headaches.  Hematological: Bruises/bleeds easily.  Psychiatric/Behavioral: Negative for dysphoric mood. The patient is not nervous/anxious.        Objective:   Physical Exam        Assessment & Plan:

## 2012-06-03 ENCOUNTER — Telehealth: Payer: Self-pay | Admitting: Pulmonary Disease

## 2012-06-03 DIAGNOSIS — G4733 Obstructive sleep apnea (adult) (pediatric): Secondary | ICD-10-CM

## 2012-06-03 NOTE — Telephone Encounter (Signed)
I have placed order for home sleep study.

## 2012-06-03 NOTE — Telephone Encounter (Signed)
Left pt a message to set up home sleep study [name]

## 2012-06-03 NOTE — Telephone Encounter (Signed)
Message copied by [name] on Wed Jun 03, 2012  4:13 PM ------      Message from: Shelton Silvas E      Created: Wed Jun 03, 2012 12:52 PM       Pt's insurance denied sleep study at sleep center she will have to do home study and i need an order thanks libby

## 2012-06-22 ENCOUNTER — Ambulatory Visit (INDEPENDENT_AMBULATORY_CARE_PROVIDER_SITE_OTHER): Payer: BC Managed Care – PPO | Admitting: Internal Medicine

## 2012-06-22 ENCOUNTER — Encounter: Payer: Self-pay | Admitting: Internal Medicine

## 2012-06-22 ENCOUNTER — Telehealth: Payer: Self-pay | Admitting: Pulmonary Disease

## 2012-06-22 VITALS — BP 158/86 | HR 85 | Temp 98.1°F | Wt 313.0 lb

## 2012-06-22 DIAGNOSIS — R739 Hyperglycemia, unspecified: Secondary | ICD-10-CM

## 2012-06-22 DIAGNOSIS — R790 Abnormal level of blood mineral: Secondary | ICD-10-CM

## 2012-06-22 DIAGNOSIS — E119 Type 2 diabetes mellitus without complications: Secondary | ICD-10-CM

## 2012-06-22 DIAGNOSIS — R7309 Other abnormal glucose: Secondary | ICD-10-CM

## 2012-06-22 DIAGNOSIS — H1012 Acute atopic conjunctivitis, left eye: Secondary | ICD-10-CM

## 2012-06-22 DIAGNOSIS — I10 Essential (primary) hypertension: Secondary | ICD-10-CM

## 2012-06-22 DIAGNOSIS — E876 Hypokalemia: Secondary | ICD-10-CM

## 2012-06-22 DIAGNOSIS — H1010 Acute atopic conjunctivitis, unspecified eye: Secondary | ICD-10-CM

## 2012-06-22 DIAGNOSIS — J309 Allergic rhinitis, unspecified: Secondary | ICD-10-CM | POA: Insufficient documentation

## 2012-06-22 MED ORDER — OLOPATADINE HCL 0.2 % OP SOLN: 1.0000 [drp] | Freq: Two times a day (BID) | OPHTHALMIC | Status: DC

## 2012-06-22 MED ORDER — AMLODIPINE BESYLATE 10 MG PO TABS: 10.0000 mg | ORAL_TABLET | Freq: Every day | ORAL | Status: DC

## 2012-06-22 MED ORDER — POTASSIUM CHLORIDE CRYS ER 20 MEQ PO TBCR: 20.0000 meq | EXTENDED_RELEASE_TABLET | Freq: Every day | ORAL | Status: DC

## 2012-06-22 MED ORDER — HYDROCHLOROTHIAZIDE 25 MG PO TABS: 25.0000 mg | ORAL_TABLET | ORAL | Status: DC | PRN

## 2012-06-22 NOTE — Telephone Encounter (Signed)
LMTCB- I believe patient is calling back in regards to having her home sleep study scheduled; looks as though Almyra Free tried to call her to schedule this. I want to check with patient to make sure she has not had this scheduled before just sending message to [facility].

## 2012-06-22 NOTE — Patient Instructions (Addendum)
Use the rx  Allergy eye drops  And hold on  Make for now.  If eye not better plan see eye doctor.  Iagree proceeding with   Sleep study.   Get fasting labs at [facility]  Lipids, bmp , Magnesium,  lfts, cbcdiff and tsh   Dx hypertension  Low potassium   Can refill  Med for 90 days .   OV in 3 months or as needed   Can call in clinical  Status with BP readings  etc  inlieu of ov for refill med .  To ensure safe and adequate  follow up.

## 2012-06-22 NOTE — Progress Notes (Signed)
Chief Complaint  Patient presents with  . Eye swelling and redness    Started 4 wks ago.    HPI: Patient comes in today for SDA for  new problem evaluation.   However  Also needs bp meds refilled last refilled b another doctor that she saw in the meantime but isn't going back .  [name]    .  Onset   Of left eye tiching immediatelyy after trying a new mascara in store   And  ever since than has had itchy irritated left eye without discharge or crusting   Or vision change  Glasses no contacts.  ? Pink eye  Using  otc visine .   Has hx of pink eye in the past.    No eye make up now no fb or eye pain . No allergy or fever or uri sx.    needs meds refilled  Pharmacy told her she needed ov  get meds filled.   Is takin amlodipine and   Potassium and only taking hctzas needed.   Uncertain BP control knows she needs to lose weight   Last labs were done elsewhere .    Not available for viewing  ? What potassium was .   Did see Dr Craige Cotta  Trying to get osa evaluation scheduled with insurance.   Related concern that she should keep having to come in for med evalulations.    Was seen in winter for  Flu like illness   Ongoing bladder issues  Poss related to sleep situation.   .   ROS: See pertinent positives and negatives per HPI. No bleeding cp new sob.   Past Medical History  Diagnosis Date  . Hypertension     x many years (since birht of her son at 89) Had workup with renal artery angiogram that showed no evidence for fibromuscular dysplasia ( no significant renal artery stenosis.) ECHO (8/11) EF 60-65%, mild LVH, no regional WMAs.  . Morbid obesity   . Allergic rhinitis   . Diabetes mellitus   . Hyperlipidemia   . Low back pain     Family History  Problem Relation Age of Onset  . Cancer Mother   . Hypertension Mother   . Other Father     died in MVA    History   Social History  . Marital Status: Married    Spouse Name: N/A    Number of Children: N/A  . Years of  Education: N/A   Social History Main Topics  . Smoking status: Never Smoker   . Smokeless tobacco: Never Used  . Alcohol Use: No  . Drug Use: No  . Sexually Active: None   Other Topics Concern  . None   Social History Narrative   Married   Regular exercise-yes   HH of 4   No pets   Works at [employer] Prescriptions as of 06/22/2012  Medication Sig Dispense Refill  . amLODipine (NORVASC) 10 MG tablet Take 1 tablet (10 mg total) by mouth daily.  90 tablet  0  . fish oil-omega-3 fatty acids 1000 MG capsule Take 1 capsule by mouth 3 (three) times daily.        . hydrochlorothiazide (HYDRODIURIL) 25 MG tablet Take 1 tablet (25 mg total) by mouth as needed.  90 tablet  0  . Multiple Vitamin (MULTIVITAMIN) tablet Take 1 tablet by mouth daily.        [name]  potassium chloride SA (K-DUR,KLOR-CON) 20 MEQ tablet Take 1 tablet (20 mEq total) by mouth daily.  90 tablet  0  . [DISCONTINUED] amLODipine (NORVASC) 10 MG tablet Take 1 tablet (10 mg total) by mouth daily.  30 tablet  0  . [DISCONTINUED] hydrochlorothiazide (HYDRODIURIL) 25 MG tablet Take 25 mg by mouth as needed.       . [DISCONTINUED] potassium chloride SA (K-DUR,KLOR-CON) 20 MEQ tablet TAKE 2 TABLETS BY MOUTH DAILY  60 tablet  1  . [DISCONTINUED] potassium chloride SA (K-DUR,KLOR-CON) 20 MEQ tablet       . MAGNESIUM PO Take 1 capsule by mouth daily. 25 mg       . Olopatadine HCl (PATADAY) 0.2 % SOLN Apply 1 drop to eye 2 (two) times daily.  1 Bottle  1   No facility-administered encounter medications on file as of 06/22/2012.    EXAM:  BP 158/86  Pulse 85  Temp(Src) 98.1 F (36.7 C) (Oral)  Wt 313 lb (141.976 kg)  BMI 49.01 kg/m2  SpO2 93%  Body mass index is 49.01 kg/(m^2).  GENERAL: vitals reviewed and listed above, alert, oriented, appears well hydrated and in no acute distress  HEENT: atraumatic, conjunctiva   Left with mid eye 1+ redness but no ciliary flush and no pupilarry  change   No phot=phobia  No crusing or lid swelling  no obvious abnormalities on inspection of external nose and ears  tms nl OP : no lesion edema or exudate   NECK: no obvious masses on inspection palpation  No jvd   LUNGS: clear to auscultation bilaterally, no wheezes, rales or rhonchi, good air movement  CV: HRRR, no clubbing cyanosis  1+   peripheral edema nl cap refill   MS: moves all extremities without noticeable focal  Abnormality has knee issues and hurts to gtt up on table  With left knee  PSYCH: pleasant and cooperative, no obvious depression or anxiety neuro non focal   Lab Results  Component Value Date   WBC 5.4 09/09/2008   HGB 13.7 09/09/2008   HCT 39.4 09/09/2008   PLT 365.0 09/09/2008   GLUCOSE 99 10/02/2011   CHOL 184 09/08/2009   TRIG 105.0 09/08/2009   HDL 48.30 09/08/2009   LDLCALC 115* 09/08/2009   ALT 20 05/27/2008   AST 22 05/27/2008   NA 141 10/02/2011   K 3.3* 10/02/2011   CL 103 10/02/2011   CREATININE 0.9 10/02/2011   BUN 12 10/02/2011   CO2 30 10/02/2011   TSH 0.92 10/02/2011   INR 1.0 RATIO 07/08/2006   HGBA1C 7.2* 10/02/2011   MICROALBUR 2.2* 08/11/2007   Wt Readings from Last 3 Encounters:  06/22/12 313 lb (141.976 kg)  06/02/12 316 lb 6.4 oz (143.518 kg)  04/22/12 310 lb (140.615 kg)    ASSESSMENT AND PLAN:  Discussed the following assessment and plan:  Allergic conjunctivitis and rhinitis, left - probalby no obv bacteria infection change to pataday and follow  - Plan: Olopatadine HCl (PATADAY) 0.2 % SOLN, amLODipine (NORVASC) 10 MG tablet, hydrochlorothiazide (HYDRODIURIL) 25 MG tablet, potassium chloride SA (K-DUR,KLOR-CON) 20 MEQ tablet, Lipid panel, TSH, Basic metabolic panel, CBC with Differential, Hepatic function panel, Magnesium, Hemoglobin A1c  Hypokalemia - hx of severe hypokalemia lost  - Plan: Olopatadine HCl (PATADAY) 0.2 % SOLN, amLODipine (NORVASC) 10 MG tablet, hydrochlorothiazide (HYDRODIURIL) 25 MG tablet, potassium chloride SA (K-DUR,KLOR-CON) 20  MEQ tablet, Lipid panel, TSH, Basic metabolic panel, CBC with Differential, Hepatic function panel, Magnesium, Hemoglobin A1c  Low  magnesium levels - Plan: Olopatadine HCl (PATADAY) 0.2 % SOLN, amLODipine (NORVASC) 10 MG tablet, hydrochlorothiazide (HYDRODIURIL) 25 MG tablet, potassium chloride SA (K-DUR,KLOR-CON) 20 MEQ tablet, Lipid panel, TSH, Basic metabolic panel, CBC with Differential, Hepatic function panel, Magnesium, Hemoglobin A1c  Essential hypertension, benign - uncontrolled as in past intoleracne to many meds  - Plan: Olopatadine HCl (PATADAY) 0.2 % SOLN, amLODipine (NORVASC) 10 MG tablet, hydrochlorothiazide (HYDRODIURIL) 25 MG tablet, potassium chloride SA (K-DUR,KLOR-CON) 20 MEQ tablet, Lipid panel, TSH, Basic metabolic panel, CBC with Differential, Hepatic function panel, Magnesium, Hemoglobin A1c  Diabetes - her a1c noted in past after she left  and not discussed as  this was sced as acute visit. needs fu  - Plan: Olopatadine HCl (PATADAY) 0.2 % SOLN, amLODipine (NORVASC) 10 MG tablet, hydrochlorothiazide (HYDRODIURIL) 25 MG tablet, potassium chloride SA (K-DUR,KLOR-CON) 20 MEQ tablet, Lipid panel, TSH, Basic metabolic panel, CBC with Differential, Hepatic function panel, Magnesium, Hemoglobin A1c  Hyperglycemia - Plan: Olopatadine HCl (PATADAY) 0.2 % SOLN, amLODipine (NORVASC) 10 MG tablet, hydrochlorothiazide (HYDRODIURIL) 25 MG tablet, potassium chloride SA (K-DUR,KLOR-CON) 20 MEQ tablet, Lipid panel, TSH, Basic metabolic panel, CBC with Differential, Hepatic function panel, Magnesium, Hemoglobin A1c  Morbid obesity  -Patient advised to return or notify health care team  if symptoms worsen or persist or new concerns arise.  Patient Instructions  Use the rx  Allergy eye drops  And hold on  Make for now.  If eye not better plan see eye doctor.  Iagree proceeding with   Sleep study.   Get fasting labs at [facility]  Lipids, bmp , Magnesium,  lfts, cbcdiff and tsh   Dx hypertension  Low  potassium   Can refill  Med for 90 days .   OV in 3 months or as needed   Can call in clinical  Status with BP readings  etc  inlieu of ov for refill med .  To ensure safe and adequate  follow up.        Neta Mends. Mehr Depaoli M.D. ADDED  HG A!C to her lab orders After patient left reviewed chart the visit was scheduled as an acute eye problem however she has a number of ongoing medical problems. It is fortunate that she is followed up with sleep medicine and encouraged her  to follow through with sleep evaluation as this could be adding to all of her problems.  Review the chart her A1c done in May  was elevated in the diabetic range    7.2  and I don't know if it was followed up at the other doctor's office. We'll add this onto her fasting labs make recommendations as such patient is somewhat hesitant to keep coming in frequently. She has seen other providers for acute  Illnesses but no disease management visits since MAY .   i told her We'll do as much as we can over the phone but we need to know her clinical status. If she has concerns about having certain problems  tell with an office visit ,please tell the scheduler and also get a message to [place] as the clinical team.     COPY  EHR LAB NOTE resulted   Messages  From the spring labs  The last time I saw her in the office .   Notes Recorded by Jannifer Hick, LPN on 4/0/9811 at 9:28 AM Letter sent to home address with report copy to call to discuss - several LMTCB have been done. ------  Notes Recorded by  Georga Bora, CMA on 10/09/2011 at 1:59 PM Left message on machine for patient to return our call ------  Notes Recorded by [name] on 10/08/2011 at 11:57 AM Left a message for pt to return call. ------  Notes Recorded by [name] on 10/04/2011 at 5:06 PM Called pt's home number- no vm set up. Will attempt to call at a later time. ------  Notes Recorded by Madelin Headings, MD on 10/04/2011 at 4:52  PM Inform patient that I discussed the situation with Dr. Everardo All. Her potassium is 3.3 . Despite the fact that she had some side effects but spironolactone he recommended we try a lower dose to see if she tolerates this. This would be the best trial to try to decrease her blood pressure and control the potassium level.  We would have her take 25 mg a day or even less than that and recheck her potassium in 2 weeks. Call 30 tabs refill x 1 25 mg of spironolactone 1 per day or as directed   I would like her to followup with Dr. Everardo All in a month to see if her blood pressure readings have come down. Please facilitate her getting an appointment with Dr. Everardo All.  In addition her blood sugars was good on the screen but her hemoglobin A1c is still elevated. She still may benefit from going back on metformin however week and continue to monitor if she is avoiding sugars sweets and simple carbohydrates. She needs to followup with me in 3 months in regard to her blood sugar. But I suggest we get the potassium blood pressure and sleep apnea evaluation done first.

## 2012-06-23 NOTE — Telephone Encounter (Signed)
lmomtcb for pt 

## 2012-06-24 ENCOUNTER — Other Ambulatory Visit (INDEPENDENT_AMBULATORY_CARE_PROVIDER_SITE_OTHER): Payer: BC Managed Care – PPO

## 2012-06-24 DIAGNOSIS — R7309 Other abnormal glucose: Secondary | ICD-10-CM

## 2012-06-24 DIAGNOSIS — E119 Type 2 diabetes mellitus without complications: Secondary | ICD-10-CM

## 2012-06-24 DIAGNOSIS — E876 Hypokalemia: Secondary | ICD-10-CM

## 2012-06-24 DIAGNOSIS — I10 Essential (primary) hypertension: Secondary | ICD-10-CM

## 2012-06-24 DIAGNOSIS — H1010 Acute atopic conjunctivitis, unspecified eye: Secondary | ICD-10-CM

## 2012-06-24 DIAGNOSIS — J309 Allergic rhinitis, unspecified: Secondary | ICD-10-CM

## 2012-06-24 DIAGNOSIS — R739 Hyperglycemia, unspecified: Secondary | ICD-10-CM

## 2012-06-24 DIAGNOSIS — R790 Abnormal level of blood mineral: Secondary | ICD-10-CM

## 2012-06-24 DIAGNOSIS — H1012 Acute atopic conjunctivitis, left eye: Secondary | ICD-10-CM

## 2012-06-24 LAB — LIPID PANEL
HDL: 49.3 mg/dL (ref >39.00)
Triglycerides: 102 mg/dL (ref 0.0–149.0)
VLDL: 20.4 mg/dL (ref 0.0–40.0)

## 2012-06-24 LAB — CBC WITH DIFFERENTIAL/PLATELET
Basophils Absolute: 0 10*3/uL (ref 0.0–0.1)
Basophils Relative: 0.7 % (ref 0.0–3.0)
Eosinophils Absolute: 0.1 10*3/uL (ref 0.0–0.7)
Lymphocytes Relative: 38.3 % (ref 12.0–46.0)
MCHC: 33.7 g/dL (ref 30.0–36.0)
MCV: 91.1 fl (ref 78.0–100.0)
Monocytes Absolute: 0.5 10*3/uL (ref 0.1–1.0)
Neutrophils Relative %: 51 % (ref 43.0–77.0)
Platelets: 368 10*3/uL (ref 150.0–400.0)
RBC: 4.49 Mil/uL (ref 3.87–5.11)
RDW: 13.6 % (ref 11.5–14.6)

## 2012-06-24 LAB — BASIC METABOLIC PANEL
BUN: 11 mg/dL (ref 6–23)
CO2: 32 mEq/L (ref 19–32)
Calcium: 9 mg/dL (ref 8.4–10.5)
Chloride: 101 mEq/L (ref 96–112)
Creatinine, Ser: 0.9 mg/dL (ref 0.4–1.2)
Glucose, Bld: 125 mg/dL — ABNORMAL HIGH (ref 70–99)

## 2012-06-24 LAB — HEPATIC FUNCTION PANEL
AST: 24 U/L (ref 0–37)
Alkaline Phosphatase: 76 U/L (ref 39–117)
Bilirubin, Direct: 0.1 mg/dL (ref 0.0–0.3)
Total Protein: 7.7 g/dL (ref 6.0–8.3)

## 2012-06-24 NOTE — Telephone Encounter (Signed)
lmtcb Sally E Ottinger ° °

## 2012-06-24 NOTE — Telephone Encounter (Signed)
Per Almyra Free she did try calling pt. Will send to [facility]

## 2012-06-29 NOTE — Telephone Encounter (Signed)
[name] have you spoken to this patient yet? Please advise. Carron Curie, CMA

## 2012-06-29 NOTE — Telephone Encounter (Signed)
Left message again for pt to return my call Tobe Sos

## 2012-06-29 NOTE — Telephone Encounter (Signed)
Spoke to pt is aware her insurance does not require precert for home sleep study she will call back when she is ready to set it up [name]

## 2012-07-01 ENCOUNTER — Other Ambulatory Visit: Payer: Self-pay | Admitting: Family Medicine

## 2012-07-01 DIAGNOSIS — R739 Hyperglycemia, unspecified: Secondary | ICD-10-CM

## 2012-07-01 DIAGNOSIS — H1012 Acute atopic conjunctivitis, left eye: Secondary | ICD-10-CM

## 2012-07-01 DIAGNOSIS — E876 Hypokalemia: Secondary | ICD-10-CM

## 2012-07-01 DIAGNOSIS — I10 Essential (primary) hypertension: Secondary | ICD-10-CM

## 2012-07-01 DIAGNOSIS — R790 Abnormal level of blood mineral: Secondary | ICD-10-CM

## 2012-07-01 DIAGNOSIS — E119 Type 2 diabetes mellitus without complications: Secondary | ICD-10-CM

## 2012-07-01 MED ORDER — METFORMIN HCL ER 500 MG PO TB24: 500.0000 mg | ORAL_TABLET | Freq: Every day | ORAL | Status: DC

## 2012-07-03 ENCOUNTER — Encounter (HOSPITAL_BASED_OUTPATIENT_CLINIC_OR_DEPARTMENT_OTHER): Payer: BC Managed Care – PPO

## 2012-09-14 ENCOUNTER — Telehealth: Payer: Self-pay | Admitting: Internal Medicine

## 2012-09-14 NOTE — Telephone Encounter (Signed)
Patient called stating that she need a refill of her klor con 20 meq 1poqd and amlodipine 10 mg 1poqd [facility] Please assist. Patient had not been able to get her sleep study due to questioning of insurance.

## 2012-09-15 ENCOUNTER — Other Ambulatory Visit: Payer: Self-pay | Admitting: Internal Medicine

## 2012-09-15 DIAGNOSIS — E119 Type 2 diabetes mellitus without complications: Secondary | ICD-10-CM

## 2012-09-15 DIAGNOSIS — R790 Abnormal level of blood mineral: Secondary | ICD-10-CM

## 2012-09-15 DIAGNOSIS — R739 Hyperglycemia, unspecified: Secondary | ICD-10-CM

## 2012-09-15 DIAGNOSIS — I10 Essential (primary) hypertension: Secondary | ICD-10-CM

## 2012-09-15 DIAGNOSIS — E876 Hypokalemia: Secondary | ICD-10-CM

## 2012-09-15 DIAGNOSIS — H1012 Acute atopic conjunctivitis, left eye: Secondary | ICD-10-CM

## 2012-09-15 MED ORDER — POTASSIUM CHLORIDE CRYS ER 20 MEQ PO TBCR: 20.0000 meq | EXTENDED_RELEASE_TABLET | Freq: Every day | ORAL | Status: DC

## 2012-09-15 MED ORDER — AMLODIPINE BESYLATE 10 MG PO TABS: 10.0000 mg | ORAL_TABLET | Freq: Every day | ORAL | Status: DC

## 2012-09-15 NOTE — Telephone Encounter (Signed)
Sent to [facility].

## 2012-10-19 ENCOUNTER — Telehealth: Payer: Self-pay | Admitting: Internal Medicine

## 2012-10-19 NOTE — Telephone Encounter (Signed)
i agree with having her begin medication  Metformin 500 mg per day  but needs   Labs tests   Bmp  mganesium and hg a1c in the meantime and then ROV .  Within  A few weeks  .    Help accommodate  Which appt   times are easier to come for office visit . As I know she has a busy work schedule .

## 2012-10-19 NOTE — Telephone Encounter (Signed)
Pt states that a couple months ago Dr Fabian Sharp advised METFORMIN. Pt did not ever fill it. Now, she states d/t symptoms she's having (up all night urinating 12 times /night; extreme fatigue; this is all leading to concentration and memory issues) she wants a rx for it called in immediately - today.  Wants 1 30-day sent to [facility] on [address], then all other following rx for the METFORMIN [facility] to [facility]. I told her the chart shows that we sent a rx to [facility] in Feb, so they probably still have it. I also advised that Dr Demetrius Charity may want an OV. Please call her on CELL if need to be for OV, etc.

## 2012-10-21 ENCOUNTER — Other Ambulatory Visit: Payer: Self-pay | Admitting: Family Medicine

## 2012-10-21 DIAGNOSIS — E119 Type 2 diabetes mellitus without complications: Secondary | ICD-10-CM

## 2012-10-21 DIAGNOSIS — E876 Hypokalemia: Secondary | ICD-10-CM

## 2012-10-21 MED ORDER — METFORMIN HCL ER 500 MG PO TB24: 500.0000 mg | ORAL_TABLET | Freq: Every day | ORAL | Status: DC

## 2012-10-21 NOTE — Telephone Encounter (Signed)
Left message on cell.  No machine at home number.

## 2012-10-21 NOTE — Telephone Encounter (Signed)
Pt notified by telephone.  Metformin sent to [facility] per the pt.  Pt placed on hold to be sent to one of the schedulers.  Informed front desk staff to schedule lab and follow up in 1 month.

## 2012-11-11 ENCOUNTER — Ambulatory Visit: Payer: BC Managed Care – PPO | Admitting: Internal Medicine

## 2012-12-07 ENCOUNTER — Ambulatory Visit: Payer: BC Managed Care – PPO | Admitting: Internal Medicine

## 2012-12-16 ENCOUNTER — Telehealth: Payer: Self-pay | Admitting: Internal Medicine

## 2012-12-16 DIAGNOSIS — R739 Hyperglycemia, unspecified: Secondary | ICD-10-CM

## 2012-12-16 DIAGNOSIS — I10 Essential (primary) hypertension: Secondary | ICD-10-CM

## 2012-12-16 DIAGNOSIS — E876 Hypokalemia: Secondary | ICD-10-CM

## 2012-12-16 DIAGNOSIS — E119 Type 2 diabetes mellitus without complications: Secondary | ICD-10-CM

## 2012-12-16 DIAGNOSIS — H1012 Acute atopic conjunctivitis, left eye: Secondary | ICD-10-CM

## 2012-12-16 DIAGNOSIS — R790 Abnormal level of blood mineral: Secondary | ICD-10-CM

## 2012-12-16 MED ORDER — AMLODIPINE BESYLATE 10 MG PO TABS: 10.0000 mg | ORAL_TABLET | Freq: Every day | ORAL | Status: DC

## 2012-12-16 MED ORDER — POTASSIUM CHLORIDE CRYS ER 20 MEQ PO TBCR: 20.0000 meq | EXTENDED_RELEASE_TABLET | Freq: Every day | ORAL | Status: DC

## 2012-12-16 NOTE — Telephone Encounter (Signed)
Sent by [facility]. 

## 2012-12-16 NOTE — Telephone Encounter (Signed)
Pt needs refills on amlodipine 10 mg,hctz 25 mg and potassium chloride 20 meq enough for 90 day supply with [facility]

## 2012-12-30 ENCOUNTER — Ambulatory Visit (INDEPENDENT_AMBULATORY_CARE_PROVIDER_SITE_OTHER): Payer: BC Managed Care – PPO | Admitting: Internal Medicine

## 2012-12-30 ENCOUNTER — Encounter: Payer: Self-pay | Admitting: Internal Medicine

## 2012-12-30 VITALS — BP 140/64 | HR 73 | Temp 98.1°F | Wt 319.0 lb

## 2012-12-30 DIAGNOSIS — R600 Localized edema: Secondary | ICD-10-CM

## 2012-12-30 DIAGNOSIS — R739 Hyperglycemia, unspecified: Secondary | ICD-10-CM

## 2012-12-30 DIAGNOSIS — R7309 Other abnormal glucose: Secondary | ICD-10-CM

## 2012-12-30 DIAGNOSIS — I10 Essential (primary) hypertension: Secondary | ICD-10-CM

## 2012-12-30 DIAGNOSIS — H1010 Acute atopic conjunctivitis, unspecified eye: Secondary | ICD-10-CM

## 2012-12-30 DIAGNOSIS — E876 Hypokalemia: Secondary | ICD-10-CM

## 2012-12-30 DIAGNOSIS — E119 Type 2 diabetes mellitus without complications: Secondary | ICD-10-CM

## 2012-12-30 DIAGNOSIS — J309 Allergic rhinitis, unspecified: Secondary | ICD-10-CM

## 2012-12-30 DIAGNOSIS — H1012 Acute atopic conjunctivitis, left eye: Secondary | ICD-10-CM

## 2012-12-30 DIAGNOSIS — R609 Edema, unspecified: Secondary | ICD-10-CM

## 2012-12-30 DIAGNOSIS — R790 Abnormal level of blood mineral: Secondary | ICD-10-CM

## 2012-12-30 MED ORDER — POTASSIUM CHLORIDE CRYS ER 20 MEQ PO TBCR: 40.0000 meq | EXTENDED_RELEASE_TABLET | Freq: Every day | ORAL | Status: DC

## 2012-12-30 MED ORDER — CARVEDILOL 6.25 MG PO TABS: 6.2500 mg | ORAL_TABLET | Freq: Two times a day (BID) | ORAL | Status: DC

## 2012-12-30 MED ORDER — FUROSEMIDE 20 MG PO TABS: ORAL_TABLET | ORAL | Status: DC

## 2012-12-30 NOTE — Progress Notes (Signed)
Chief Complaint  Patient presents with  . Follow-up    Also has foot, ankle and leg swelling.  Legs feel very tight.  . Diabetes  . Hypertension    HPI: Patient comes in for multiple medical problems. Related to her last blood sugars she's begun on metformin once a day and tolerating it fine and feels less tired and she is on it. Trying the healthier with blood sugar. Eating  Better trying avoiiding sugar.  Blood pressure is been in the 144 range. She is on norvasc and using as needed HCTZ with potassium . When he milliequivalents once a day. Taking the diuretic a couple times a week doesn't help her legs swelling her feet are swelling more over the last few weeks.  She is seen pulmonary and it is highly suspicious that she could have sleep apnea. Hasn't been able to get the test because insurance issues and reimbursement but now she has new insurance and will look into this again. Her brother has sleep apnea and reported to her that he is so much better having used CPAP but he now can sleep and feels much better in the day. She now sounds convinced that this is an important evaluation and treatment to have she tends to wake up 10-12 times a night. And then has nocturia also .   [name]  She's also ready to be a bit more aggressive about blood pressure if possible. She's had side effects from many medications.  Recently she was put on a Z-Pak by an urgent care for a possible sinus infection and cough she does get diarrhea  she still has a cough but no fever some drainage.  She is willing to see an endocrinologist also if needed. ROS: See pertinent positives and negatives per HPI. No current chest pain or new shortness of breath. Extreme fatigue and sleepiness related to above. Great toe hurt sometimes in tingles wants to know how to make sure she doesn't get diabetic foot.  Past Medical History  Diagnosis Date  . Hypertension     x many years (since birht of her son at 12) Had workup with renal  artery angiogram that showed no evidence for fibromuscular dysplasia ( no significant renal artery stenosis.) ECHO (8/11) EF 60-65%, mild LVH, no regional WMAs.  . Morbid obesity   . Allergic rhinitis   . Diabetes mellitus   . Hyperlipidemia   . Low back pain     Family History  Problem Relation Age of Onset  . Cancer Mother   . Hypertension Mother   . Other Father     died in MVA    History   Social History  . Marital Status: Married    Spouse Name: N/A    Number of Children: N/A  . Years of Education: N/A   Social History Main Topics  . Smoking status: Never Smoker   . Smokeless tobacco: Never Used  . Alcohol Use: No  . Drug Use: No  . Sexual Activity: None   Other Topics Concern  . None   Social History Narrative   Married   Regular exercise-yes   HH of 4   No pets   Works at [employer] Prescriptions as of 12/30/2012  Medication Sig Dispense Refill  . amLODipine (NORVASC) 10 MG tablet Take 1 tablet (10 mg total) by mouth daily.  90 tablet  0  . fish oil-omega-3 fatty acids 1000 MG  capsule Take 1 capsule by mouth 3 (three) times daily.        . hydrochlorothiazide (HYDRODIURIL) 25 MG tablet Take 1 tablet (25 mg total) by mouth as needed.  90 tablet  0  . metFORMIN (GLUCOPHAGE XR) 500 MG 24 hr tablet Take 1 tablet (500 mg total) by mouth daily with breakfast.  90 tablet  1  . Multiple Vitamin (MULTIVITAMIN) tablet Take 1 tablet by mouth daily.        . potassium chloride SA (K-DUR,KLOR-CON) 20 MEQ tablet Take 2 tablets (40 mEq total) by mouth daily.  180 tablet  1  . [DISCONTINUED] MAGNESIUM PO Take 1 capsule by mouth daily. 25 mg       . [DISCONTINUED] potassium chloride SA (K-DUR,KLOR-CON) 20 MEQ tablet Take 1 tablet (20 mEq total) by mouth daily.  90 tablet  0  . carvedilol (COREG) 6.25 MG tablet Take 1 tablet (6.25 mg total) by mouth 2 (two) times daily with a meal. For high blood pressure  60 tablet  2  . furosemide  (LASIX) 20 MG tablet Take once a day for 10 days or as directed. Take with potassium  20 tablet  1  . [DISCONTINUED] Olopatadine HCl (PATADAY) 0.2 % SOLN Apply 1 drop to eye 2 (two) times daily.  1 Bottle  1   No facility-administered encounter medications on file as of 12/30/2012.    EXAM:  BP 140/64  Pulse 73  Temp(Src) 98.1 F (36.7 C) (Oral)  Wt 319 lb (144.697 kg)  BMI 49.95 kg/m2  SpO2 97%  Body mass index is 49.95 kg/(m^2).  GENERAL: vitals reviewed and listed above, alert, oriented, appears well hydrated and in no acute distress looks tired but alert HEENT: atraumatic, conjunctiva  clear, no obvious abnormalities on inspection of external nose and ears OP : no lesion edema or exudate minimal runny nose face nontender NECK: no obvious masses on inspection palpation no JVD obvious adenopathy LUNGS: clear to auscultation bilaterally, no wheezes, rales or rhonchi, good air movement CV: HRRR, no murmur is heard no clubbing cyanosis  nl cap refill has +3 pedal edema up to the mid calf no redness acute joint changes feet showed no calluses or ulcers. MS: moves all extremities PSYCH: pleasant and cooperative, no obvious depression or anxiety  ASSESSMENT AND PLAN:  Discussed the following assessment and plan:  Type II or unspecified type diabetes mellitus without mention of complication, not stated as uncontrolled - Plan: POC Glucose (CBG), Basic metabolic panel, Hemoglobin A1c, Ambulatory referral to Endocrinology  HYPOKALEMIA - Plan: POC Glucose (CBG), Basic metabolic panel, Hemoglobin A1c, Ambulatory referral to Endocrinology  HYPERTENSION - Plan: POC Glucose (CBG), Basic metabolic panel, Hemoglobin A1c, Ambulatory referral to Endocrinology  Edema leg - Plan: POC Glucose (CBG), Basic metabolic panel, Hemoglobin A1c, Ambulatory referral to Endocrinology  Allergic conjunctivitis and rhinitis, left - probalby no obv bacteria infection change to pataday and follow  - Plan:  potassium chloride SA (K-DUR,KLOR-CON) 20 MEQ tablet  Hypokalemia - hx of severe hypokalemia lost  - Plan: potassium chloride SA (K-DUR,KLOR-CON) 20 MEQ tablet  Low magnesium levels - Plan: potassium chloride SA (K-DUR,KLOR-CON) 20 MEQ tablet  Essential hypertension, benign - uncontrolled as in past intoleracne to many meds  - Plan: potassium chloride SA (K-DUR,KLOR-CON) 20 MEQ tablet  Diabetes - her a1c noted in past after she left  and not discussed as  this was sced as acute visit. needs fu  - Plan: potassium chloride SA (K-DUR,KLOR-CON) 20 MEQ tablet  Hyperglycemia - Plan: potassium chloride SA (K-DUR,KLOR-CON) 20 MEQ tablet  Overall her medical situation has been problematic because of her fear of a number of interventions. However fortunately she is convinced that this time that proceeding with sleep apnea evaluation and treatment may make her feel better. And I agree this is important for her health. It may help her blood pressure control. She is intolerant to a number of medicines include ACE receptor blockers. Hypokalemia has been problematic and although spironolactone was helpful she got diarrhea with that and we cannot use.. Is willing to try other medication. At this time I think more aggressive diuretic short-term therapy with potassium replacement is in order for the next 10 days. We can give Lasix once a day for 10 days and take 40 mEq of potassium a day.  Check chemistries today plan close followup.  Afterwards we can add alpha beta blocker carvedilol 6.25 twice a day.    In regard to her diabetes did discussinterventions to prevent problem see instructions at this time she declined a Pneumovax because she is scared of some of the vaccines.  Will bring these up in the future  I will do a referral to endocrinology to help with diabetes and her low potassium management.  Encouraged her to follow-through with re\re pursuing the sleep apnea diagnosis and treatment.  Continue on  metformin at this time get a hemoglobin A1c and chemistry today. -Patient advised to return or notify health care team  if symptoms worsen or persist or new concerns arise.  Patient Instructions  Will let you know about lab results when they are available I suspect her blood sugar is better. Will arrange  help from an endocrinologist.  For her diabetes we usually recommend a cholesterol statin medication 81 mg of aspirin a day immunizations with pneumonia vaccine and hepatitis A and B. vaccine yearly flu shots all if tolerated.  I agree that probable sleep apnea is contributing to most of your symptoms. Blood pressure control usually improves his sleep apnea is treated.  I am concerned that your legs are so swollen the Norvasc and do some of that but it is more than not.  We will add a 10 day course of Lasix to see if we can get the swelling down and add supplemental potassium with this.  Also carvedilol low dose twice a day we can increase the dose as needed for blood pressure control this is an alpha beta blocker  acting slightly slow the pulse rate down but most people do very well on this medicine it has been used in people with heart failure also.   Neta Mends. Lucienne Sawyers M.D. Prolonged visit  Total visit 30 mins > 50% spent counseling and coordinating care

## 2012-12-30 NOTE — Patient Instructions (Addendum)
Will let you know about lab results when they are available I suspect her blood sugar is better. Will arrange  help from an endocrinologist.  For her diabetes we usually recommend a cholesterol statin medication 81 mg of aspirin a day immunizations with pneumonia vaccine and hepatitis A and B. vaccine yearly flu shots all if tolerated.  I agree that probable sleep apnea is contributing to most of your symptoms. Blood pressure control usually improves his sleep apnea is treated.  I am concerned that your legs are so swollen the Norvasc and do some of that but it is more than not.  We will add a 10 day course of Lasix to see if we can get the swelling down and add supplemental potassium with this.  Also carvedilol low dose twice a day we can increase the dose as needed for blood pressure control this is an alpha beta blocker  acting slightly slow the pulse rate down but most people do very well on this medicine it has been used in people with heart failure also.

## 2012-12-31 LAB — HEMOGLOBIN A1C: Hgb A1c MFr Bld: 7.5 % — ABNORMAL HIGH (ref 4.6–6.5)

## 2012-12-31 LAB — BASIC METABOLIC PANEL
GFR: 106.86 mL/min (ref >60.00)
Potassium: 3.2 mEq/L — ABNORMAL LOW (ref 3.5–5.1)
Sodium: 137 mEq/L (ref 135–145)

## 2013-01-12 ENCOUNTER — Telehealth: Payer: Self-pay | Admitting: Internal Medicine

## 2013-01-12 ENCOUNTER — Other Ambulatory Visit: Payer: Self-pay | Admitting: Family Medicine

## 2013-01-12 DIAGNOSIS — E876 Hypokalemia: Secondary | ICD-10-CM

## 2013-01-12 NOTE — Telephone Encounter (Signed)
Left information on the patient's cell.  Any advise for the swelling?

## 2013-01-12 NOTE — Telephone Encounter (Signed)
Called pt about her referral and she states that the swelling in her feet has not got any better. She also states that you have been playing phone tag with her lab results and she is requesting you call her back on her cell and if she does not pick up to leave the information on her voicemail.

## 2013-01-26 ENCOUNTER — Telehealth: Payer: Self-pay | Admitting: Cardiology

## 2013-01-26 ENCOUNTER — Telehealth: Payer: Self-pay | Admitting: Internal Medicine

## 2013-01-26 NOTE — Telephone Encounter (Signed)
Patient Information:  Caller Name: Mairany  Phone: [phone]  Patient: [name], [name]  Gender: Female  DOB: Mar 19, 1961  Age: 52 Years  PCP: Berniece Andreas [facility])  Pregnant: No  Office Follow Up:  Does the office need to follow up with this patient?: No  Instructions For The Office: N/A  RN Note:  Pt was elevated on 8-27 for Edema, Pt calling again today, d/t Edema in Feet, not relieved by Lasix, lasted taken on 9-21.  Feet are red, painful.  Pt denies SOB or Chest discomfort.  All emergent sxs ruled out per Edema protocol, see provider w/n 24 hrs d/t worsening edema, unexplained.  Appt scheduled at 8am on 9-24 w/ Dr Selena Batten.  Pt verbalized understanding.  Symptoms  Reason For Call & Symptoms: Feet Edema  Reviewed Health History In EMR: Yes  Reviewed Medications In EMR: Yes  Reviewed Allergies In EMR: Yes  Reviewed Surgeries / Procedures: Yes  Date of Onset of Symptoms: 12/30/2012  Treatments Tried: Lasix 20mg  daily  Treatments Tried Worked: No OB / GYN:  LMP: Unknown  Guideline(s) Used:  No Protocol Available - Sick Adult  Disposition Per Guideline:   See Today or Tomorrow in Office  Reason For Disposition Reached:   Nursing judgment  Advice Given:  N/A  Patient Will Follow Care Advice:  YES  Appointment Scheduled:  01/27/2013 08:00:00 Appointment Scheduled Provider:  Selena Batten (TEXT 1st, after 20 mins can call), Dahlia Client [facility])

## 2013-01-26 NOTE — Telephone Encounter (Signed)
Spoke with patient. Pt states she saw Dr Shirlee Latch many years ago for hypertension. She is going to follow up with Dr Fabian Sharp about further recommendations for edema.

## 2013-01-26 NOTE — Telephone Encounter (Signed)
New Problem  Pt states that her feet have been swelling like "balloons" cannot put on her shoes/// experiencing extreme pain... She said it feels as if her feet are broken... Not sure if it is related to her diabetes or if its due to a heart issue. Primary care gave her a lasix for 20 days.[name] Not working.[name]

## 2013-01-27 ENCOUNTER — Encounter: Payer: Self-pay | Admitting: Family Medicine

## 2013-01-27 ENCOUNTER — Ambulatory Visit (INDEPENDENT_AMBULATORY_CARE_PROVIDER_SITE_OTHER): Payer: BC Managed Care – PPO | Admitting: Family Medicine

## 2013-01-27 VITALS — BP 130/82 | Temp 98.2°F | Wt 314.0 lb

## 2013-01-27 DIAGNOSIS — E119 Type 2 diabetes mellitus without complications: Secondary | ICD-10-CM

## 2013-01-27 DIAGNOSIS — E876 Hypokalemia: Secondary | ICD-10-CM

## 2013-01-27 DIAGNOSIS — R790 Abnormal level of blood mineral: Secondary | ICD-10-CM

## 2013-01-27 DIAGNOSIS — I10 Essential (primary) hypertension: Secondary | ICD-10-CM

## 2013-01-27 DIAGNOSIS — R739 Hyperglycemia, unspecified: Secondary | ICD-10-CM

## 2013-01-27 DIAGNOSIS — J309 Allergic rhinitis, unspecified: Secondary | ICD-10-CM

## 2013-01-27 DIAGNOSIS — H1010 Acute atopic conjunctivitis, unspecified eye: Secondary | ICD-10-CM

## 2013-01-27 DIAGNOSIS — H1012 Acute atopic conjunctivitis, left eye: Secondary | ICD-10-CM

## 2013-01-27 DIAGNOSIS — R7309 Other abnormal glucose: Secondary | ICD-10-CM

## 2013-01-27 DIAGNOSIS — R609 Edema, unspecified: Secondary | ICD-10-CM

## 2013-01-27 DIAGNOSIS — R600 Localized edema: Secondary | ICD-10-CM

## 2013-01-27 LAB — BASIC METABOLIC PANEL
BUN: 13 mg/dL (ref 6–23)
Creatinine, Ser: 0.8 mg/dL (ref 0.4–1.2)
GFR: 99.07 mL/min (ref >60.00)
Potassium: 3.5 mEq/L (ref 3.5–5.1)
Sodium: 139 mEq/L (ref 135–145)

## 2013-01-27 LAB — MAGNESIUM: Magnesium: 1.8 mg/dL (ref 1.5–2.5)

## 2013-01-27 MED ORDER — FUROSEMIDE 40 MG PO TABS: ORAL_TABLET | ORAL | Status: DC

## 2013-01-27 MED ORDER — AMLODIPINE BESYLATE 5 MG PO TABS: 5.0000 mg | ORAL_TABLET | Freq: Every day | ORAL | Status: DC

## 2013-01-27 NOTE — Patient Instructions (Addendum)
-  elevation of legs for 30 minutes above waist every day  -compression every day   -decrease norvasc to 5 mg daily  -lasix 40 mg twice daily (and potassium twice daily) for 3 days then decrease lasix and potassium to 40 once daily  -low salt diet  -follow up with Dr. Fabian Sharp in 1-2 weeks and schedule follow up appointment with your cardiologist as well

## 2013-01-27 NOTE — Progress Notes (Signed)
Chief Complaint  Patient presents with  . Foot Swelling    HPI:  [name] is a 84 F patient of Dr. Fabian Sharp with morbid obesity, chronic LE edema, HTN, DM, HLD and poor compliance with care recs per PCP notes here for an acute visit for:  1) Feet swelling: -per office visit notes this is not new  -it is a chronic problem with sig edema noted at least over one year ago on review of chart and at all visits since -saw her PCP for this a few weeks ago with 3+ pitting edema to calf per notes -per notes lasix with potassium (hx hypokal, mild on recent labs) , helped but pt pt ran out of lasix - reports actually better today after using compression -pt has cardiologist and has recently been referred to endo  -denies: worsening SOB, DOE or orthopnea, CP, palpitations, skin break down, weight gain - wt last visit 319 (actually has lost 5 lbs since last visit) -she does not use compression or elevation  ROS: See pertinent positives and negatives per HPI.  Past Medical History  Diagnosis Date  . Hypertension     x many years (since birht of her son at 44) Had workup with renal artery angiogram that showed no evidence for fibromuscular dysplasia ( no significant renal artery stenosis.) ECHO (8/11) EF 60-65%, mild LVH, no regional WMAs.  . Morbid obesity   . Allergic rhinitis   . Diabetes mellitus   . Hyperlipidemia   . Low back pain     Past Surgical History  Procedure Laterality Date  . Abdominal hysterectomy      partial for fibroids  . Tubal ligation      Family History  Problem Relation Age of Onset  . Cancer Mother   . Hypertension Mother   . Other Father     died in MVA    History   Social History  . Marital Status: Married    Spouse Name: N/A    Number of Children: N/A  . Years of Education: N/A   Social History Main Topics  . Smoking status: Never Smoker   . Smokeless tobacco: Never Used  . Alcohol Use: No  . Drug Use: No  . Sexual Activity: None    Other Topics Concern  . None   Social History Narrative   Married   Regular exercise-yes   HH of 4   No pets   Works at [employer]          Current outpatient prescriptions:amLODipine (NORVASC) 5 MG tablet, Take 1 tablet (5 mg total) by mouth daily., Disp: 30 tablet, Rfl: 0;  carvedilol (COREG) 6.25 MG tablet, Take 1 tablet (6.25 mg total) by mouth 2 (two) times daily with a meal. For high blood pressure, Disp: 60 tablet, Rfl: 2;  fish oil-omega-3 fatty acids 1000 MG capsule, Take 1 capsule by mouth 3 (three) times daily.  , Disp: , Rfl:  hydrochlorothiazide (HYDRODIURIL) 25 MG tablet, Take 1 tablet (25 mg total) by mouth as needed., Disp: 90 tablet, Rfl: 0;  metFORMIN (GLUCOPHAGE XR) 500 MG 24 hr tablet, Take 1 tablet (500 mg total) by mouth daily with breakfast., Disp: 90 tablet, Rfl: 1;  Multiple Vitamin (MULTIVITAMIN) tablet, Take 1 tablet by mouth daily.  , Disp: , Rfl:  potassium chloride SA (K-DUR,KLOR-CON) 20 MEQ tablet, Take 2 tablets (40 mEq total) by mouth daily., Disp: 180 tablet, Rfl: 1;  furosemide (LASIX) 40 MG tablet, 40mg  twice daily for  3 days, then once daily until you see Dr. Fabian Sharp, Disp: 30 tablet, Rfl: 0;  [DISCONTINUED] potassium chloride (KLOR-CON) 20 MEQ packet, Take 40 mEq by mouth daily., Disp: 60 tablet, Rfl: 2  EXAM:  Filed Vitals:   01/27/13 0804  BP: 130/82  Temp: 98.2 F (36.8 C)    Body mass index is 49.17 kg/(m^2).  GENERAL: vitals reviewed and listed above, alert, oriented, appears well hydrated and in no acute distress  HEENT: atraumatic, conjunttiva clear, no obvious abnormalities on inspection of external nose and ears  NECK: no obvious masses on inspection, no JVD  LUNGS: clear to auscultation bilaterally, no wheezes, rales or rhonchi, good air movement  CV: HRRR, bilat LE edema  MS: moves all extremities without noticeable abnormality  PSYCH: pleasant and cooperative, no obvious depression or anxiety  ASSESSMENT AND  PLAN:  Discussed the following assessment and plan:  Allergic conjunctivitis and rhinitis, left - probalby no obv bacteria infection change to pataday and follow   Hypokalemia - hx of severe hypokalemia lost   Low magnesium levels  Essential hypertension, benign - uncontrolled as in past intoleracne to many meds  - Plan: amLODipine (NORVASC) 5 MG tablet  Diabetes - her a1c noted in past after she left  and not discussed as  this was sced as acute visit. needs fu   Hyperglycemia  Lower extremity edema - Plan: furosemide (LASIX) 40 MG tablet  -chronic LE edema, wt down from last visit,  likely multifactorial but mostly venous stasis issues -decrease norvasc to 5mg  daily as 10mg  dose frequently contributes to LE edema - PCP may wish to discontinue at some point and do potassium sparing anti-hypertensive in combo with diuretics, not sure why not done in the past so will not make this change now as am not familiar with this patient and her complex issues -advised compression, elevation, low sodium diet, re-initiate lasix (40mg  bid for 3 days with K+ bid) then daily with follow up with PCP next week (would check K+ and mag then) and advised her to schedule appt with her cardiologist as well -Patient advised to return or notify a doctor immediately if symptoms worsen or persist or new concerns arise.  Patient Instructions  -elevation of legs for 30 minutes above waist every day  -compression every day   -decrease norvasc to 5 mg daily  -lasix 40 mg twice daily (and potassium twice daily) for 3 days then decrease lasix and potassium to 40 once daily  -low salt diet  -follow up with Dr. Fabian Sharp in 1-2 weeks and schedule follow up appointment with your cardiologist as well     Terressa Koyanagi.

## 2013-01-27 NOTE — Addendum Note (Signed)
Addended by: Serena Colonel on: 01/27/2013 08:32 AM   Modules accepted: Orders

## 2013-02-04 ENCOUNTER — Ambulatory Visit (INDEPENDENT_AMBULATORY_CARE_PROVIDER_SITE_OTHER): Payer: BC Managed Care – PPO | Admitting: Internal Medicine

## 2013-02-04 ENCOUNTER — Encounter: Payer: Self-pay | Admitting: Internal Medicine

## 2013-02-04 VITALS — BP 154/98 | HR 81 | Temp 98.1°F | Resp 10 | Ht 66.0 in | Wt 313.0 lb

## 2013-02-04 DIAGNOSIS — E269 Hyperaldosteronism, unspecified: Secondary | ICD-10-CM

## 2013-02-04 DIAGNOSIS — I10 Essential (primary) hypertension: Secondary | ICD-10-CM

## 2013-02-04 DIAGNOSIS — E669 Obesity, unspecified: Secondary | ICD-10-CM

## 2013-02-04 DIAGNOSIS — E876 Hypokalemia: Secondary | ICD-10-CM

## 2013-02-04 DIAGNOSIS — E119 Type 2 diabetes mellitus without complications: Secondary | ICD-10-CM

## 2013-02-04 DIAGNOSIS — E1169 Type 2 diabetes mellitus with other specified complication: Secondary | ICD-10-CM

## 2013-02-04 NOTE — Patient Instructions (Addendum)
Please return in a month with your sugar log.  PATIENT INSTRUCTIONS FOR TYPE 2 DIABETES:  **Please join MyChart!** - see attached instructions about how to join   DIET AND EXERCISE Diet and exercise is an important part of diabetic treatment.  We recommended aerobic exercise in the form of brisk walking (working between 40-60% of maximal aerobic capacity, similar to brisk walking) for 150 minutes per week (such as 30 minutes five days per week) along with 3 times per week performing 'resistance' training (using various gauge rubber tubes with handles) 5-10 exercises involving the major muscle groups (upper body, lower body and core) performing 10-15 repetitions (or near fatigue) each exercise. Start at half the above goal but build slowly to reach the above goals. If limited by weight, joint pain, or disability, we recommend daily walking in a swimming pool with water up to waist to reduce pressure from joints while allow for adequate exercise.    BLOOD GLUCOSES Monitoring your blood glucoses is important for continued management of your diabetes. Please check your blood glucoses 2-4 times a day: fasting, before meals and at bedtime (you can rotate these measurements - e.g. one day check before the 3 meals, the next day check before 2 of the meals and before bedtime, etc.   HYPOGLYCEMIA (low blood sugar) Hypoglycemia is usually a reaction to not eating, exercising, or taking too much insulin/ other diabetes drugs.  Symptoms include tremors, sweating, hunger, confusion, headache, etc. Treat IMMEDIATELY with 15 grams of Carbs:   4 glucose tablets    cup regular juice/soda   2 tablespoons raisins   4 teaspoons sugar   1 tablespoon honey Recheck blood glucose in 15 mins and repeat above if still symptomatic/blood glucose <100. Please contact our office at [phone] if you have questions about how to next handle your insulin.  RECOMMENDATIONS TO REDUCE YOUR RISK OF DIABETIC COMPLICATIONS: *  Take your prescribed MEDICATION(S). * Follow a DIABETIC diet: Complex carbs, fiber rich foods, heart healthy fish twice weekly, (monounsaturated and polyunsaturated) fats * AVOID saturated/trans fats, high fat foods, >2,300 mg salt per day. * EXERCISE at least 5 times a week for 30 minutes or preferably daily.  * DO NOT SMOKE OR DRINK more than 1 drink a day. * Check your FEET every day. Do not wear tightfitting shoes. Contact us if you develop an ulcer * See your EYE doctor once a year or more if needed * Get a FLU shot once a year * Get a PNEUMONIA vaccine once before and once after age 68 years  GOALS:  * Your Hemoglobin A1c of <7%  * fasting sugars need to be <130 * after meals sugars need to be <180 (2h after you start eating) * Your Systolic BP should be 140 or lower  * Your Diastolic BP should be 80 or lower  * Your HDL (Good Cholesterol) should be 40 or higher  * Your LDL (Bad Cholesterol) should be 100 or lower  * Your Triglycerides should be 150 or lower  * Your Urine microalbumin (kidney function) should be <30 * Your Body Mass Index should be 25 or lower   We will be glad to help you achieve these goals. Our telephone number is: [phone].

## 2013-02-04 NOTE — Progress Notes (Addendum)
Subjective:     Patient ID: [name], female   DOB: 1960-07-17, 52 y.o.   MRN: [id number]  HPI  [name] is a 52 y.o.-year-old female, referred by her PCP, Dr.Panosh, for management of DM2, non-insulin-dependent, uncontrolled, without complications, also for HTN/hyperaldosteronism/bilateral leg edema.  DM2: Patient has been diagnosed with diabetes in 07-12/2012 per her report, but 2010 at least per review of chart; she has not been on insulin before. Last hemoglobin A1c was: Lab Results  Component Value Date   HGBA1C 7.5* 01/27/2013    Pt is on a regimen of: - Metformin 500 mg po daily  Pt does not check sugars as she "was not told to check it". Unknown if high or low CBGs. ? hypoglycemia awareness at 70. Highest sugar was 187 - as checked by nurse at work.   Lost lbs by walking/Zumba >> 8 lbs by her scale since starting metformin 8 weeks ago.   - no CKD, last BUN/creatinine:  Lab Results  Component Value Date   BUN 13 01/27/2013   CREATININE 0.8 01/27/2013   - last set of lipids: Lab Results  Component Value Date   CHOL 185 06/24/2012   HDL 49.30 06/24/2012   LDLCALC 115* 06/24/2012   TRIG 102.0 06/24/2012   CHOLHDL 4 06/24/2012   - last eye exam was 4 years. No DR.  - + numbness and tingling in her feet - but had foot surgery - unclear if from that, but increased swelling and crushing pain   No FH of DM (? If MGM).   HTN: - dx 20 years ago after birth of second child She is on: - on Norvasc 5 mg (decreased from 10 a week ago) for 10-15 years - added Lasix added 1 month ago: 20 mg daily, then 1 week ago increased to 40 bid x 3 days, then daily until next week - on HCTZ as needed for leg edema (but not helping) prn - takes it maybe twice a week  Per review of the chart and discussion with her and with Dr Fabian Sharp, pt was also on: - labetalol >> unclear why stopped - Coreg >> never filled the Rx - Bystolic >> had samples in the past, did not continue -  clonidine >> intolerance due to fatigue - spironolactone >> intolerance due to diarrhea, fatigue - ARB's >> intolerance, ? Cause - Tekturna >> intolerance,? Cause  - Chlorthalidone >> intolerance, ? Cause - leg cramps?  Contributing factors: - OSA also - no NSAIDs - no coffee use - no alcohol - no smoking - no licorice - is limiting salt - is exercising regularly.  Patient had an MRA in 10/2004 >> possible left proximal fibromuscular dysplasia, however she then had selective renal angiography in 07/2006 >> did not confirm the diagnosis.  Has a h/o HTN in mother. Has a FH of strokes at old age in [place] and GF.  She has leg swelling - especially with Norvasc.  She was dx with hypokalemia before starting diuretics. Cannot remember if potassium low before starting these ~4 years ago, per her report. Normal Magnesium per review of chart  Review of Systems Constitutional:+ weight loss, + fatigue, no subjective hyperthermia/hypothermia, + poor sleep Eyes: no blurry vision, no xerophthalmia ENT: + sore throat, no nodules palpated in throat, no dysphagia/odynophagia, no hoarseness Cardiovascular: no CP/+ SOB/no palpitations/+++leg swelling Respiratory: + cough/+ SOB Gastrointestinal: + N/no V/+ D/no C Musculoskeletal: + muscle cramps/+ joint aches Skin: no rashes; + itching, +  hair loss Neurological: no tremors/numbness/tingling/dizziness, + HA Psychiatric: no depression/anxiety  Past Medical History  Diagnosis Date  . Hypertension     x many years (since birht of her son at 35) Had workup with renal artery angiogram that showed no evidence for fibromuscular dysplasia ( no significant renal artery stenosis.) ECHO (8/11) EF 60-65%, mild LVH, no regional WMAs.  . Morbid obesity   . Allergic rhinitis   . Diabetes mellitus   . Hyperlipidemia   . Low back pain    Past Surgical History  Procedure Laterality Date  . Abdominal hysterectomy      partial for fibroids  . Tubal ligation      History   Social History  . Marital Status: Married    Spouse Name: N/A    Number of Children: 2   Occupational History  . [employer]   Social History Main Topics  . Smoking status: Never Smoker   . Smokeless tobacco: Never Used  . Alcohol Use: No  . Drug Use: No   Social History Narrative   Married   Regular exercise-yes   HH of 4   No pets   Works at [employer]   Current Outpatient Prescriptions on File Prior to Visit  Medication Sig Dispense Refill  . amLODipine (NORVASC) 5 MG tablet Take 1 tablet (5 mg total) by mouth daily.  30 tablet  0  . carvedilol (COREG) 6.25 MG tablet Take 1 tablet (6.25 mg total) by mouth 2 (two) times daily with a meal. For high blood pressure  60 tablet  2  . fish oil-omega-3 fatty acids 1000 MG capsule Take 1 capsule by mouth 3 (three) times daily.        . furosemide (LASIX) 40 MG tablet 40mg  twice daily for 3 days, then once daily until you see Dr. Fabian Sharp  30 tablet  0  . hydrochlorothiazide (HYDRODIURIL) 25 MG tablet Take 1 tablet (25 mg total) by mouth as needed.  90 tablet  0  . metFORMIN (GLUCOPHAGE XR) 500 MG 24 hr tablet Take 1 tablet (500 mg total) by mouth daily with breakfast.  90 tablet  1  . Multiple Vitamin (MULTIVITAMIN) tablet Take 1 tablet by mouth daily.        . potassium chloride SA (K-DUR,KLOR-CON) 20 MEQ tablet Take 2 tablets (40 mEq total) by mouth daily.  180 tablet  1  . [DISCONTINUED] potassium chloride (KLOR-CON) 20 MEQ packet Take 40 mEq by mouth daily.  60 tablet  2   No current facility-administered medications on file prior to visit.   Allergies  Allergen Reactions  . Candesartan Cilexetil   . Codeine     Throat swelling  . Ibuprofen     hives  . Telmisartan   . Zithromax [Azithromycin]     diarrhea    Family History  Problem Relation Age of Onset  . Cancer Mother   . Hypertension Mother   . Other Father     died in MVA   Objective:   Physical Exam BP 154/98  Pulse 81   Temp(Src) 98.1 F (36.7 C) (Oral)  Resp 10  Ht 5\' 6"  (1.676 m)  Wt 313 lb (141.976 kg)  BMI 50.54 kg/m2  SpO2 95% Wt Readings from Last 3 Encounters:  02/04/13 313 lb (141.976 kg)  01/27/13 314 lb (142.429 kg)  12/30/12 319 lb (144.697 kg)   Constitutional: obese, in NAD Eyes: PERRLA, EOMI, no exophthalmos ENT: moist mucous membranes, no thyromegaly, no cervical lymphadenopathy  Cardiovascular: RRR, No MRG, + significant pitting bilateral leg edema Respiratory: CTA B Gastrointestinal: abdomen soft, NT, ND, BS+ Musculoskeletal: no deformities, strength intact in all 4 Skin: moist, warm, no rashes Neurological: no tremor with outstretched hands, DTR normal in all 4    Assessment:     1. HTN - Left renal artery ? proximal fibromuscular dysplasia per MRA 10/28/2014 - also, at that time, a probably simple cyst of 2 cm was seen in the upper pole of the right kidney >> pt had renal angiography in 07/2006 >> did not show FMD (Dr Samule Ohm): FINDINGS:  1. Abdominal aorta: Normal abdominal aorta with no atherosclerotic  plaque and no aneurysm.  2. Left renal arteries: There are two left renal arteries of roughly  equal in size. Both are angiographically normal. The left kidney  appears normal in size.  3. Right renal artery: Single vessel. It is angiographically normal.  The right kidney appears normal in size.  IMPRESSION/RECOMMENDATIONS: Angiographically normal renal arteries.  There is no suggestion of fibromuscular dysplasia.  - hyperaldosteronism 2012 per checks by Dr. Everardo All - patient not on beta blockers, ACE inhibitors, or ARBs at the time 01/15/2011: Potassium 2.9 Aldosterone 16 PRA 0.20 Aldosterone/PRA 80 Cortisol 6.5 at 9 AM  02/25/2011:  Potassium 3.4 Aldosterone 22 PRA 0.18 Aldosterone/PRA 122  2. Hyperaldosteronism - per tests 2012  - ? If primary or secondary  3. DM2  4. Severe leg edema bilaterally  5. Hypokalemia     Plan:     1. HTN - BP today  high, at 154/98 - agree with performing split night study for OSA - pt not smoking/drinking alcohol/drinking coffee - pt not using NSAIDs - no licorice use - advised for importance of reducing salt intake - she is now on the following regimen: Norvasc 5 Lasix 40 bid + KDur 40 bid HCTZ prn - approximately 2x a week - for leg edema - this regimen appears not to be adequately controlling her blood pressure and moreover, Norvasc is exacerbating her leg edema - since we do need the CCB for her blood pressure control, I suggest that we switch to felodipine, which is also a dihydropyridine CCB, but with less accentuated leg edema propensity. Ideally, I would like to use a third generation lipophilic dihydropyridine, like lacidipine, but I am not sure if she can afford this. Ultimately, we can also use diltiazem extended-release if we have to. Also, I believe that she would benefit more from a thiazide diuretic than from Lasix, especially chlorthalidone, however I am worried about her hypokalemia with this. Will discuss with Dr. Fabian Sharp.  - patient has suspicion for fibromuscular dysplasia as per review of her MRA report from 2006, but apparently this was ruled out by renal angiography in 07/2006.  2. Hyperaldosteronism - patient was evaluated by Dr. Everardo All in 2012 for primary hyperaldosteronism, and she had 2 separate checks, during which the aldosterone was slightly elevated, and the PRA was suppressed, resulting in a high aldosterone/PRA ratio. However, the potassium levels were subnormal at the time of the check (improved and the second check), which could suppress the RAAS, rendering the evaluation inconclusive. The fact that the PRA was suppressed is concerning for primary, rather than secondary hyperaldosteronism, however fluid overload or low-renin essential hypertension RN are entities in which renin might be suppressed and aldosterone normal or elevated, too. - she was referred for a CT abdomen to  evaluate her adrenal glands, however this was not performed - unfortunately, she could not  tolerate spironolactone in the past because of diarrhea; we can also try Eplerenone, but I would only and is off the investigation for primary hyperaldosteronism is complete. I'm not sure if she can afford this.  3. Hypokalemia  - can be related to her hyperaldosteronism or diuretic use - potassium between 2.8-3.4, with few instances at 3.5 (at last check in 01/27/2013) and 3.6 (in 2010 and 2011) - she is on potassium supplements - magnesium levels have been normal in the past, between 1.7 and 2.0 per review of the chart - this appears to predate her start of diuretic meds  4. Severe bilateral leg edema - This appears to be related to the dihydropyridine calcium channel blocker, since patient remembers that it started right after addition of Norvasc > 10 years ago. Please see plan above to switch to felodipine or even to another calcium channel blocker that has less leg edema capacity. - adviced her to get compression stockings, as initially advised by Dr. Fabian Sharp - if she has obstructive sleep apnea, compliance with CPAP should help  5. Patient with long-standing, recently more uncontrolled diabetes, on oral antidiabetic regimen, which became insufficient - We discussed about options for treatment, and I suggested to continue current regimen for now and start checking her sugars at different times of the day - check 1-2 times a day, rotating checks. We demonstrated how to check her sugars indeed her meter strips - given sugar log and advised how to fill it and to bring it at next appt  - given foot care handout and explained the principles  - given instructions for hypoglycemia management "15-15 rule"  - advised for yearly eye exams - Return to clinic in one month with sugar log   I called and discussed with Dr Fabian Sharp about pt >> OK to try to change Amlodipine to Felodipine >> sent to pharmacy >> d/w pt  and will start this and stop Norvasc. Advised to check BP every day or every second day at work and call me is sBP still > 150. Will come in a week for tests: Orders Placed This Encounter  Procedures  . Aldosterone + renin activity w/ ratio  . Potassium  . Amb ref to Medical Nutrition Therapy-MNT   Time spent with the pt: 1h, of which >50% was spent counseling the patient about diabetes, hypertension, and bilateral leg edema; also reviewing her extensive medical history, including previous notes by PCP and endocrinologist, reviewing imaging studies, reviewing labs; discussing with PCP; arranging further workup.   02/09/2013:  I will addend the results when they become available.       02/19/2013 Component     Latest Ref Rng 01/15/2011 02/25/2011 10/02/2011 06/24/2012  PRA LC/MS/MS     0.25 - 5.82 ng/mL/h 0.20 (L) 0.18 (L)    ALDO / PRA Ratio     0.9 - 28.9 Ratio 80.0 (H) 122.2 (H)    ALDOSTERONE      16 22    Potassium     3.5 - 5.1 mEq/L 2.9 (L) 3.4 (L) 3.3 (L) 3.4 (L)   Component     Latest Ref Rng 12/30/2012 01/27/2013 02/11/2013  PRA LC/MS/MS     0.25 - 5.82 ng/mL/h   0.16 (L)  ALDO / PRA Ratio     0.9 - 28.9 Ratio   143.8 (H)  ALDOSTERONE        23  Potassium     3.5 - 5.1 mEq/L 3.2 (L) 3.5 3.5   Aldo/PRA  ratio elevated, Aldo >20, PRA suppressed >> will need confirmatory test for Primary hyperaldosteronism: salt loading test >> will order in Short Stay.

## 2013-02-05 MED ORDER — FELODIPINE ER 5 MG PO TB24: 5.0000 mg | ORAL_TABLET | Freq: Every day | ORAL | Status: DC

## 2013-02-10 ENCOUNTER — Encounter: Payer: Self-pay | Admitting: Internal Medicine

## 2013-02-11 ENCOUNTER — Other Ambulatory Visit (INDEPENDENT_AMBULATORY_CARE_PROVIDER_SITE_OTHER): Payer: BC Managed Care – PPO

## 2013-02-11 DIAGNOSIS — E269 Hyperaldosteronism, unspecified: Secondary | ICD-10-CM

## 2013-02-17 ENCOUNTER — Telehealth: Payer: Self-pay | Admitting: Internal Medicine

## 2013-02-17 NOTE — Telephone Encounter (Signed)
Patient Information:  Caller Name: Mi[place]  Phone: [phone]  Patient: Marion, Seese  Gender: Female  DOB: [birth year]  Age: 52 Years  PCP: Berniece Andreas [facility])  Pregnant: No  Office Follow Up:  Does the office need to follow up with this patient?: Yes  Instructions For The Office: Patient would like to be seen on Friday by Dr. Fabian Sharp very early or very late.  PLEASE CONTACT PATINET TO SCHEDULED.  RN Note:  Patient would like to be seen on Friday by Dr. Fabian Sharp very early or very late.  PLEASE CONTACT PATINET TO SCHEDULED.  Symptoms  Reason For Call & Symptoms: Patient states she normally sits at work 8 hours or more.  She is complaining of Left leg behind her knee  onse for a week.  no calf pain. No color change. No numbness or tingling. She is able to walk and feels better. There is no swelling. Denies injury.  "feels like a rubber band around the knee".  She has been Metformin for about 8 weeks.  Reviewed Health History In EMR: Yes  Reviewed Medications In EMR: Yes  Reviewed Allergies In EMR: Yes  Reviewed Surgeries / Procedures: Yes  Date of Onset of Symptoms: 02/10/2013 OB / GYN:  LMP: Unknown  Guideline(s) Used:  Knee Pain  Disposition Per Guideline:   See Within 3 Days in Office  Reason For Disposition Reached:   Moderate pain (e.g., symptoms interfere with work or school, limping) and present > 3 days  Advice Given:  Knee Pain after Overuse:   Apply Heat to the Area: Beginning 48 hours after an injury, apply a warm washcloth or heating pad for 10 minutes three times a day to help increase blood flow and improve healing.  Rest Your Knee   for the next couple days. Avoid activities that worsen your pain. Reduce activities that put a lot of strain on the knee joint (e.g., deep knee bends, stair climbing, running).  Pain Medicines:  For pain relief, you can take either acetaminophen, ibuprofen, or naproxen.  They are over-the-counter (OTC) pain drugs.  You can buy them at the drugstore.  Acetaminophen (e.g., Tylenol):  Regular Strength Tylenol: Take 650 mg (two 325 mg pills) by mouth every 4-6 hours as needed. Each Regular Strength Tylenol pill has 325 mg of acetaminophen.  Call Back If:  Knee pain lasts longer than 7 days  You become worse.  Patient Will Follow Care Advice:  YES

## 2013-02-17 NOTE — Telephone Encounter (Signed)
Monday  Or tues  At 4 pm  Slot but can come as soon as possible  (420 ok )  Oct 20 and 21

## 2013-02-18 LAB — ALDOSTERONE + RENIN ACTIVITY W/ RATIO
ALDO / PRA Ratio: 143.8 Ratio — ABNORMAL HIGH (ref 0.9–28.9)
PRA LC/MS/MS: 0.16 ng/mL/h — ABNORMAL LOW (ref 0.25–5.82)

## 2013-02-18 NOTE — Telephone Encounter (Signed)
Scheduled for 10/21

## 2013-02-18 NOTE — Telephone Encounter (Signed)
Please make appt. Thanks.  

## 2013-02-22 ENCOUNTER — Telehealth: Payer: Self-pay | Admitting: Internal Medicine

## 2013-02-22 ENCOUNTER — Telehealth: Payer: Self-pay | Admitting: *Deleted

## 2013-02-22 NOTE — Telephone Encounter (Signed)
Chart updated, fax sent

## 2013-02-22 NOTE — Telephone Encounter (Signed)
Short stay has returned an order to [place] by fax. It needs a dx, dx code, and needs date and time on order. Please add this info and return by fax / Sherri S.

## 2013-02-22 NOTE — Telephone Encounter (Signed)
Left detailed message for pt advising of appointment at [facility] Stay on Friday 02-26-13 at 7am.  Return call for cancellation is Marylu Lund [phone].

## 2013-02-22 NOTE — Telephone Encounter (Signed)
Dx code: 255.10: hyperaldosteronism Date or order: 02/22/2013, time of the fax Nitchia, can you please resend the fax with the above info?

## 2013-02-23 ENCOUNTER — Ambulatory Visit (INDEPENDENT_AMBULATORY_CARE_PROVIDER_SITE_OTHER): Payer: BC Managed Care – PPO | Admitting: Internal Medicine

## 2013-02-23 ENCOUNTER — Encounter: Payer: Self-pay | Admitting: Internal Medicine

## 2013-02-23 VITALS — BP 148/80 | HR 69 | Temp 97.9°F | Wt 311.0 lb

## 2013-02-23 DIAGNOSIS — M25569 Pain in unspecified knee: Secondary | ICD-10-CM

## 2013-02-23 DIAGNOSIS — R609 Edema, unspecified: Secondary | ICD-10-CM

## 2013-02-23 DIAGNOSIS — I10 Essential (primary) hypertension: Secondary | ICD-10-CM

## 2013-02-23 DIAGNOSIS — E119 Type 2 diabetes mellitus without complications: Secondary | ICD-10-CM

## 2013-02-23 DIAGNOSIS — R600 Localized edema: Secondary | ICD-10-CM

## 2013-02-23 DIAGNOSIS — M25562 Pain in left knee: Secondary | ICD-10-CM

## 2013-02-23 DIAGNOSIS — R739 Hyperglycemia, unspecified: Secondary | ICD-10-CM

## 2013-02-23 DIAGNOSIS — R7309 Other abnormal glucose: Secondary | ICD-10-CM

## 2013-02-23 LAB — GLUCOSE, POCT (MANUAL RESULT ENTRY): POC Glucose: 101 mg/dl — AB (ref 70–99)

## 2013-02-23 MED ORDER — FUROSEMIDE 40 MG PO TABS
40.0000 mg | ORAL_TABLET | Freq: Every day | ORAL | Status: DC
Start: 2013-02-23 — End: 2015-07-24

## 2013-02-23 NOTE — Patient Instructions (Signed)
OK to stay on lasix for now    Give th knee more time  And if   i f perisitent or progressive.    Adapt the zumba exercise  Tylenol is safe for now. Please get compression stockings fitted to help with the leg swelling as we discussed .  To work against gravity.    Will send note to dr Reece Agar.  Sounds like your sugars are better  Consider increasing the metformin.      Due for hg a1c end of December or January.

## 2013-02-23 NOTE — Progress Notes (Signed)
Chief Complaint  Patient presents with  . Follow-up    Complains of left knee pain.  Started 2 weeks ago.  Has also started Zumba recently.    HPI: Patient comes in today for new problem but also ? follow up of  multiple medical problems.  Mostly knee problem  Pt has been having left knee pain and discomfort recently without fall or pop . Thinks its having legs down all day.  Sits all days.  No twisting   Some zumba   But had sx before.  No fever.   Some giving out.   No prev surgery.  Bp and edema and low K   Ongoing eval per dr Reece Agar  Problematic  With hx of poss se . She feels lasix helped her swelling  Is on K supplementation and her ccb has been changed to assess effect on edema ( some better?) she hasn't gotten compression stockings yet . Sleep eval not yet.  Thinks BG getting better  ROS: See pertinent positives and negatives per HPI.  Past Medical History  Diagnosis Date  . Hypertension     x many years (since birht of her son at 23) Had workup with renal artery angiogram that showed no evidence for fibromuscular dysplasia ( no significant renal artery stenosis.) ECHO (8/11) EF 60-65%, mild LVH, no regional WMAs.  . Morbid obesity   . Allergic rhinitis   . Diabetes mellitus   . Hyperlipidemia   . Low back pain     Family History  Problem Relation Age of Onset  . Cancer Mother   . Hypertension Mother   . Other Father     died in MVA    History   Social History  . Marital Status: Married    Spouse Name: N/A    Number of Children: N/A  . Years of Education: N/A   Social History Main Topics  . Smoking status: Never Smoker   . Smokeless tobacco: Never Used  . Alcohol Use: No  . Drug Use: No  . Sexual Activity: None   Other Topics Concern  . None   Social History Narrative   Married   Regular exercise-yes   HH of 4   No pets   Works at [employer] Prescriptions as of 02/23/2013  Medication Sig Dispense Refill  .  felodipine (PLENDIL) 5 MG 24 hr tablet Take 1 tablet (5 mg total) by mouth daily.  30 tablet  2  . furosemide (LASIX) 40 MG tablet Take 1 tablet (40 mg total) by mouth daily.  30 tablet  1  . metFORMIN (GLUCOPHAGE XR) 500 MG 24 hr tablet Take 1 tablet (500 mg total) by mouth daily with breakfast.  90 tablet  1  . Multiple Vitamin (MULTIVITAMIN) tablet Take 1 tablet by mouth daily.        . potassium chloride SA (K-DUR,KLOR-CON) 20 MEQ tablet Take 2 tablets (40 mEq total) by mouth daily.  180 tablet  1  . [DISCONTINUED] furosemide (LASIX) 40 MG tablet 40mg  twice daily for 3 days, then once daily until you see Dr. Fabian Sharp  30 tablet  0  . [DISCONTINUED] fish oil-omega-3 fatty acids 1000 MG capsule Take 1 capsule by mouth 3 (three) times daily.        . [DISCONTINUED] hydrochlorothiazide (HYDRODIURIL) 25 MG tablet Take 1 tablet (25 mg total) by mouth as needed.  90 tablet  0   No facility-administered  encounter medications on file as of 02/23/2013.   Wt Readings from Last 3 Encounters:  02/23/13 311 lb (141.069 kg)  02/04/13 313 lb (141.976 kg)  01/27/13 314 lb (142.429 kg)     EXAM:  BP 148/80  Pulse 69  Temp(Src) 97.9 F (36.6 C) (Oral)  Wt 311 lb (141.069 kg)  BMI 50.22 kg/m2  SpO2 96%  Body mass index is 50.22 kg/(m^2).  GENERAL: vitals reviewed and listed above, alert, oriented, appears well hydrated and in no acute distress HEENT: atraumatic, conjunctiva  clear, no obvious abnormalities on inspection of external nose and ears  NECK: no obvious masses on inspection palpation no jvd  LUNGS: clear to auscultation bilaterally, no wheezes, rales or rhonchi, good air movement CV: HRRR, no clubbing cyanosis or   nl cap refill  MS: 2-3 +  Le edema no skin breakdown or ulcers  Left knee tender posteriorly and jt line tender but gait only minimally antalgic.  No  redness or warmth.   PSYCH: pleasant and cooperative, no obvious depression or anxiety Lab Results  Component Value Date    HGBA1C 7.5* 01/27/2013    ASSESSMENT AND PLAN:  Discussed the following assessment and plan:  Knee pain, left - seems joint related    Type II or unspecified type diabetes mellitus without mention of complication, not stated as uncontrolled  Lower extremity edema - discussion about  swelling and need for compression since legs down in am should help.  - Plan: furosemide (LASIX) 40 MG tablet  Hyperglycemia - Plan: POC Glucose (CBG)  Unspecified essential hypertension  Morbid obesity Reviewed guideline that advise statin medication with dx of dm .  pna vaccine flu vaccine etc  -Patient advised to return or notify health care team  if symptoms worsen or persist or new concerns arise.  Patient Instructions  OK to stay on lasix for now    Give th knee more time  And if   i f perisitent or progressive.    Adapt the zumba exercise  Tylenol is safe for now. Please get compression stockings fitted to help with the leg swelling as we discussed .  To work against gravity.    Will send note to dr Reece Agar.  Sounds like your sugars are better  Consider increasing the metformin.      Due for hg a1c end of December or January.    Neta Mends. Elisah Parmer M.D.  Total visit > 50% spent counseling and coordinating care    disc weight loss  Compression stockings .  Adaptive exercise pt says she agrees  encouraged fu with endo.  Get sleep eval

## 2013-02-25 ENCOUNTER — Telehealth: Payer: Self-pay | Admitting: *Deleted

## 2013-02-25 NOTE — Telephone Encounter (Signed)
I called pt on Monday 02/22/2013 and explained in detail our findings so far (positive screening test for primary aldosteronism) and why we need the Saline loading test (confirmatory test for primary hyperaldosteronism). I explained what the test entails, how long she can expect to be at Short stay and what the next plan of action will be if the test is positive. If pt decides to defer the test, this would be a hindrance in her diagnosis and treatment.

## 2013-02-25 NOTE — Telephone Encounter (Signed)
Dee from [employer] [phone]) called stating she talked with pt and pt states she isn't coming for the test tomorrow (7:00am). She states she wants to know why she is having this test done and she doesn't feel she has enough information about why she is having this done. Dee advised pt to contact Dr Elvera Lennox and ask these questions. Geraldine Contras is not sure if she should take the pt off the schedule or to prepare for her to come. Please advise.

## 2013-02-26 ENCOUNTER — Telehealth: Payer: Self-pay | Admitting: *Deleted

## 2013-02-26 ENCOUNTER — Other Ambulatory Visit (HOSPITAL_COMMUNITY): Payer: Self-pay | Admitting: Internal Medicine

## 2013-02-26 ENCOUNTER — Inpatient Hospital Stay (HOSPITAL_COMMUNITY): Admission: RE | Admit: 2013-02-26 | Payer: BC Managed Care – PPO | Source: Ambulatory Visit

## 2013-02-26 NOTE — Telephone Encounter (Signed)
I understand. Can we call her insurance and see? They SHOULD cover this. The dx is hyperaldosteronism, ICD 255.10.She also has hypertension and hyperkalemia. Her screening test for primary hyperaldosteronism was positive and she needs confirmation of the dx. This is not an elective test.

## 2013-02-26 NOTE — Telephone Encounter (Signed)
Pt returned call. She stated that she did understand the reason for the test. She said that it was too short of a notice, they are short staffed at work and she really couldn't get off work. Also, she was concerned at the cost due to the fact she is not sure if her insurance will cover this. She has 2 children in college and cannot afford any extra bills. She does appreciate all that Dr Elvera Lennox is doing for her. She will have the test done. She knows that her potassium has to be at a certain level to have it done as well. She asked if our office is the one that calls to be sure this test is covered by her insurance or is this something she has to do. If she has to, what is the diagnosis code if they ask? Please advise.

## 2013-02-26 NOTE — Telephone Encounter (Signed)
I called pt on 10/23k/14 and lvm on her mobile and her work phone asking her to call me back concerning the lab testing. She has not returned my call as of this morning 02/26/13.

## 2013-02-27 DIAGNOSIS — R600 Localized edema: Secondary | ICD-10-CM | POA: Insufficient documentation

## 2013-02-27 DIAGNOSIS — M25569 Pain in unspecified knee: Secondary | ICD-10-CM | POA: Insufficient documentation

## 2013-03-01 ENCOUNTER — Telehealth: Payer: Self-pay | Admitting: *Deleted

## 2013-03-01 NOTE — Telephone Encounter (Signed)
Called pt and advised her to call her insurance. Told her they should cover this test. The dx is hyperaldosteronism, ICD 255.10. You also have hypertension and hyperkalemia. The screening test for primary hyperaldosteronism was positive and you need confirmation of the dx. This is not an elective test. Pt states she will call her insurance company this afternoon and get back to me. Be advised.

## 2013-03-03 ENCOUNTER — Encounter (HOSPITAL_COMMUNITY): Payer: BC Managed Care – PPO

## 2013-03-09 ENCOUNTER — Ambulatory Visit (INDEPENDENT_AMBULATORY_CARE_PROVIDER_SITE_OTHER): Payer: BC Managed Care – PPO | Admitting: Internal Medicine

## 2013-03-09 ENCOUNTER — Other Ambulatory Visit: Payer: Self-pay | Admitting: *Deleted

## 2013-03-09 ENCOUNTER — Encounter: Payer: Self-pay | Admitting: Internal Medicine

## 2013-03-09 VITALS — BP 130/90 | HR 83 | Temp 98.6°F | Resp 10 | Wt 305.0 lb

## 2013-03-09 DIAGNOSIS — E119 Type 2 diabetes mellitus without complications: Secondary | ICD-10-CM

## 2013-03-09 DIAGNOSIS — E269 Hyperaldosteronism, unspecified: Secondary | ICD-10-CM

## 2013-03-09 DIAGNOSIS — I10 Essential (primary) hypertension: Secondary | ICD-10-CM

## 2013-03-09 DIAGNOSIS — R600 Localized edema: Secondary | ICD-10-CM

## 2013-03-09 DIAGNOSIS — R609 Edema, unspecified: Secondary | ICD-10-CM

## 2013-03-09 MED ORDER — METFORMIN HCL ER 500 MG PO TB24: 500.0000 mg | ORAL_TABLET | Freq: Two times a day (BID) | ORAL | Status: DC

## 2013-03-09 NOTE — Progress Notes (Signed)
Subjective:     Patient ID: [name], female   DOB: [birth year], 52 y.o.   MRN: [id number]  HPI  [name] is a 52 y.o.-year-old female, presenting for f/u for DM2, dx 11/2012 per her report, 2010 per chart review, non-insulin-dependent, uncontrolled, without complications, also for HTN/hyperaldosteronism/bilateral leg edema.  DM2: Last hemoglobin A1c was: Lab Results  Component Value Date   HGBA1C 7.5* 01/27/2013    Pt is on a regimen of: - Metformin 500 mg po daily >> we continued this at last visit  At last visit, she was not checking sugars but we taught her how to do it and gave her a meter/strips. ugars - 1x a day: - 89-129 fasting and 2h after b'fast ? hypoglycemia awareness. Highest sugar was 187 - as checked by nurse at work - but not in last mo. Highest in last mo was 139 in am after pizza the night before.   - no CKD, last BUN/creatinine:  Lab Results  Component Value Date   BUN 13 01/27/2013   CREATININE 0.8 01/27/2013   - last set of lipids: Lab Results  Component Value Date   CHOL 185 06/24/2012   HDL 49.30 06/24/2012   LDLCALC 115* 06/24/2012   TRIG 102.0 06/24/2012   CHOLHDL 4 06/24/2012   - last eye exam was 4 years. No DR.  - + numbness and tingling in her R toe - but had foot surgery - unclear if from that, but increased swelling and crushing pain on the lateral sides of her feet. Does not wear diabetic shows, but Born shoes - they are not very narrow and does not feel they are compressing her feet  HTN: - 150-164/80-90 at work in last month. Today 130/90. Feels very stressed at work - advised to get a cuff and check at home, too She is on: - on Felodipine (switched from Amlodipine at last visit) 5 mg  - takes Lasix 20 mg daily - on HCTZ as needed for leg edema prn - takes it qod  Pt was also on: - labetalol >> unclear why stopped - Coreg >> never filled the Rx - Bystolic >> had samples in the past, did not continue - clonidine >>  intolerance due to fatigue - spironolactone >> intolerance due to diarrhea, fatigue - ARB's >> intolerance, ? Cause - Tekturna >> intolerance,? Cause  - Chlorthalidone >> intolerance, ? Cause - leg cramps?  Contributing factors: - OSA >> needs sleep study but it is expensive >> d/w insurance (apparently has a 700$ deductible for this) - no NSAIDs - no coffee use - no alcohol - no smoking - no licorice - is limiting salt - is exercising regularly, but not as much in the last month, since she was very busy at work  She lost 8 pounds in the last month without speaking to her exercise routine. She is very excited about this and feels great.  She has leg swelling - especially with Norvasc, now better with felodipine, but not resolved.  She was dx with hypokalemia before starting diuretics. Cannot remember if potassium low before starting these ~4 years ago, per her report. She had 2 normal levels when checked in the last 2 months (3.5).  Review of Systems Constitutional: no weight gain/loss, no fatigue, no subjective hyperthermia/hypothermia Eyes: no blurry vision, no xerophthalmia ENT: no sore throat, no nodules palpated in throat, no dysphagia/odynophagia, no hoarseness Cardiovascular: no CP/SOB/palpitations/leg swelling Respiratory: no cough/SOB Gastrointestinal: no N/V/D/C Musculoskeletal: no muscle/joint  aches Skin: no rashes Neurological: no tremors/numbness/tingling/dizziness  I reviewed pt's medications, allergies, PMH, social hx, family hx and no changes required, except as mentioned above.  Objective:   Physical Exam BP 130/90  Pulse 83  Temp(Src) 98.6 F (37 C) (Oral)  Resp 10  Wt 305 lb (138.347 kg)  SpO2 97% Wt Readings from Last 3 Encounters:  03/09/13 305 lb (138.347 kg)  02/23/13 311 lb (141.069 kg)  02/04/13 313 lb (141.976 kg)   Constitutional: obese, in NAD Eyes: PERRLA, EOMI, no exophthalmos ENT: moist mucous membranes, no thyromegaly, no cervical  lymphadenopathy Cardiovascular: RRR, No MRG, + significant pitting bilateral leg edema Respiratory: CTA B Gastrointestinal: abdomen soft, NT, ND, BS+ Musculoskeletal: no deformities, strength intact in all 4 Skin: moist, warm, no rashes Neurological: no tremor with outstretched hands, DTR normal in all 4   foot exam performed today Assessment:     1. HTN - Left renal artery ? proximal fibromuscular dysplasia per MRA 10/28/2014 - also, at that time, a probably simple cyst of 2 cm was seen in the upper pole of the right kidney >> pt had renal angiography in 07/2006 >> did not show FMD (Dr Samule Ohm): FINDINGS:  1. Abdominal aorta: Normal abdominal aorta with no atherosclerotic  plaque and no aneurysm.  2. Left renal arteries: There are two left renal arteries of roughly  equal in size. Both are angiographically normal. The left kidney  appears normal in size.  3. Right renal artery: Single vessel. It is angiographically normal.  The right kidney appears normal in size.  IMPRESSION/RECOMMENDATIONS: Angiographically normal renal arteries.  There is no suggestion of fibromuscular dysplasia.  - hyperaldosteronism 2012 per checks by Dr. Everardo All - patient not on beta blockers, ACE inhibitors, or ARBs at the time 01/15/2011: Potassium 2.9 Aldosterone 16 PRA 0.20 Aldosterone/PRA 80 Cortisol 6.5 at 9 AM  02/25/2011:  Potassium 3.4 Aldosterone 22 PRA 0.18 Aldosterone/PRA 122  02/11/2013: Potassium 3.5 Aldosterone 23 PRA 0.16 Aldosterone/PRA 144  2. Hyperaldosteronism - per tests 2012 and 2014 - see above - ? If primary or secondary >> saline loading test pending as a confirmatory test for Primary hyperaldosteronism.  3. DM2  4. Severe leg edema bilaterally - improved with switching from Amlodipine to Felodipine  5. Hypokalemia     Plan:     1. HTN - BP today ok, at 130/90 - definitely needs split night study for OSA - pt not smoking/drinking alcohol/drinking coffee - pt  not using NSAIDs - no licorice use - advised for importance of reducing salt intake - will need confirmatory tests for primary hyperaldosteronism, and then specific treatment for this, depending whether this is due to bilateral increased secretion or aldosterone-secreting adenoma  - she will need to check with her insurance about coverage for saline suppression test (see below) - Will find out what the CPT code for this procedure is - patient had suspicion for fibromuscular dysplasia as per review of her MRA report from 2006, but apparently this was ruled out by renal angiography in 07/2006.  2. Hyperaldosteronism - patient was evaluated by Dr. Everardo All in 2012 for primary hyperaldosteronism, and she had 2 separate checks, during which the aldosterone was slightly elevated, and the PRA was suppressed, resulting in a high aldosterone/PRA ratio. However, the potassium levels were subnormal at the time of the check (improved and the second check), which could suppress the RAAS, rendering the evaluation inconclusive.  - she was referred for a CT abdomen to evaluate her adrenal glands,  however this was not performed - unfortunately, she could not tolerate spironolactone in the past because of diarrhea; we can also try Eplerenone but will await until investigation for primary hyperaldosteronism is complete. I'm not sure if she can afford this. - I checked a repeat aldosterone, PRA, with aldo/PRA ratio (in the context of normal potassium) and they are indicative for primary hyperaldosteronism. We'll need saline suppression test to confirm. If this is positive, will need imaging studies.  3. Hypokalemia  - can be related to her hyperaldosteronism or diuretic use - potassium between 2.8-3.5 (on 01/27/2013 and 02/11/2013) and 3.6 (in 2010 and 2011) - she is on potassium supplements - magnesium levels have been normal in the past, between 1.7 and 2.0 per review of the chart - this appears to predate her start  of diuretic meds  4. Severe bilateral leg edema - This appears to be related to the dihydropyridine calcium channel blocker, since patient remembers that it started right after addition of Norvasc > 10 years ago.  - at last visit we switched to felodipine as this has less leg edema capacity >> edema a little better - adviced her to get compression stockings, as initially advised by Dr. Fabian Sharp - advised to keep legs elevated - advised for centripetal massage daily  - if she has obstructive sleep apnea, compliance with CPAP should help  5. Patient with long-standing, recently more uncontrolled diabetes, on oral antidiabetic regimen, with fairly good sugar control in the last month, per home checks - We discussed about options for treatment, and I suggested to continue to check 1-2 times a day, rotating checks.  - Also, I suggested to increase the metformin from 500 mg daily to twice a day, not only for diabetes control but also to help with satiety and weight loss - advised for yearly eye exams >> need to get new one - advised to see podiatrist (she has been on in the past) for her foot pain - refuses a flu vaccine today >> afraid she would get sick - Return to clinic in 3 mo with sugar log

## 2013-03-09 NOTE — Patient Instructions (Signed)
Please come back for a follow-up appointment in 3 months Continue to check sugars 1x a day, rotating check times. Increase Metformin to 500 mg 2x a day with breakfast and lunch. I sent a new prescription for metformin to your pharmacy.  Please schedule an appointment with an eye doctor and a podiatrist. As soon as we give you the CPT code for the saline load test, please check with your insurance if covered (it should be covered).  Keep legs elevated. Massage legs toward the heart every day after shower. Eat as little salt as possible. Continue to lose weight.

## 2013-03-11 ENCOUNTER — Telehealth: Payer: Self-pay | Admitting: *Deleted

## 2013-03-11 NOTE — Telephone Encounter (Signed)
Called pt and had to lvm advising her of the procedure code that she will need for her insurance co, to see if they cover this procedure. Advised her that it is [phone] (IV saline (aldosterone) suppression test, at out patient/short stay at the hospital. Be advised.

## 2013-04-19 ENCOUNTER — Other Ambulatory Visit: Payer: Self-pay | Admitting: *Deleted

## 2013-04-19 MED ORDER — FELODIPINE ER 5 MG PO TB24: 5.0000 mg | ORAL_TABLET | Freq: Every day | ORAL | Status: DC

## 2013-05-31 ENCOUNTER — Other Ambulatory Visit: Payer: Self-pay | Admitting: Internal Medicine

## 2013-08-05 ENCOUNTER — Other Ambulatory Visit: Payer: Self-pay | Admitting: Internal Medicine

## 2013-08-09 NOTE — Telephone Encounter (Signed)
Looks like this was discontinued at her last visit.  Please review and advise.  Thanks!

## 2013-08-09 NOTE — Telephone Encounter (Signed)
Pt needs to FU with Dr Reece AgarG endo about the diabetes and aldostorone potassium  problem . (She was due to see her in February or such.)  (hctz is no longer on her active list! So not refilling this)

## 2013-08-13 NOTE — Telephone Encounter (Addendum)
Patient needs to FU with endocrinology  Regarding this medication I am not going to fill this medication without endo  input.  I would endo  advice about the medication. AFTER she has seen Dr Reece AgarG in endocrine. She can have rov for fu.   Please contact her about this plan .and help her get appt with Dr GReece Agar

## 2013-09-03 ENCOUNTER — Encounter: Payer: Self-pay | Admitting: Physician Assistant

## 2013-09-03 ENCOUNTER — Ambulatory Visit (INDEPENDENT_AMBULATORY_CARE_PROVIDER_SITE_OTHER): Payer: BC Managed Care – PPO | Admitting: Physician Assistant

## 2013-09-03 VITALS — BP 132/88 | HR 76 | Temp 97.6°F | Resp 16 | Wt 312.5 lb

## 2013-09-03 DIAGNOSIS — R300 Dysuria: Secondary | ICD-10-CM

## 2013-09-03 DIAGNOSIS — J209 Acute bronchitis, unspecified: Secondary | ICD-10-CM

## 2013-09-03 LAB — POCT URINALYSIS DIPSTICK
Bilirubin, UA: NEGATIVE
GLUCOSE UA: NEGATIVE
KETONES UA: NEGATIVE
Leukocytes, UA: NEGATIVE
Nitrite, UA: NEGATIVE
RBC UA: NEGATIVE
SPEC GRAV UA: 1.015
Urobilinogen, UA: 0.2
pH, UA: 6.5

## 2013-09-03 MED ORDER — PREDNISONE 20 MG PO TABS: ORAL_TABLET | ORAL | Status: DC

## 2013-09-03 NOTE — Patient Instructions (Addendum)
The prescription for prednisone has been sent to your pharmacy.  We will culture your urine sample from today and call you with the results as soon as they are available.  Drink plenty of clear fluids to help recovery.  Followup to clinic if symptoms worsen or fail to improve despite treatment. Acute Bronchitis Bronchitis is when the airways that extend from the windpipe into the lungs get red, puffy, and painful (inflamed). Bronchitis often causes thick spit (mucus) to develop. This leads to a cough. A cough is the most common symptom of bronchitis. In acute bronchitis, the condition usually begins suddenly and goes away over time (usually in 2 weeks). Smoking, allergies, and asthma can make bronchitis worse. Repeated episodes of bronchitis may cause more lung problems. HOME CARE  Rest.  Drink enough fluids to keep your pee (urine) clear or pale yellow (unless you need to limit fluids as told by your doctor).  Only take over-the-counter or prescription medicines as told by your doctor.  Avoid smoking and secondhand smoke. These can make bronchitis worse. If you are a smoker, think about using nicotine gum or skin patches. Quitting smoking will help your lungs heal faster.  Reduce the chance of getting bronchitis again by:  Washing your hands often.  Avoiding people with cold symptoms.  Trying not to touch your hands to your mouth, nose, or eyes.  Follow up with your doctor as told. GET HELP IF: Your symptoms do not improve after 1 week of treatment. Symptoms include:  Cough.  Fever.  Coughing up thick spit.  Body aches.  Chest congestion.  Chills.  Shortness of breath.  Sore throat. GET HELP RIGHT AWAY IF:   You have an increased fever.  You have chills.  You have severe shortness of breath.  You have bloody thick spit (sputum).  You throw up (vomit) often.  You lose too much body fluid (dehydration).  You have a severe headache.  You faint. MAKE SURE  YOU:   Understand these instructions.  Will watch your condition.  Will get help right away if you are not doing well or get worse. Document Released: 10/09/2007 Document Revised: 12/23/2012 Document Reviewed: 10/13/2012 [facility] Patient Information 2014 [employer], [place].

## 2013-09-03 NOTE — Progress Notes (Signed)
Pre visit review using our clinic review tool, if applicable. No additional management support is needed unless otherwise documented below in the visit note. 

## 2013-09-03 NOTE — Progress Notes (Signed)
Subjective:    Patient ID: [name] BorosSharon D Park, female    DOB: March 19, 1961, 53 y.o.   MRN: [id number]  Cough This is a new problem. The current episode started in the past 7 days. The problem has been unchanged. The problem occurs every few minutes. The cough is non-productive. Associated symptoms include shortness of breath. Pertinent negatives include no chest pain, chills, ear congestion, ear pain, fever, headaches, heartburn, hemoptysis, myalgias, nasal congestion, postnasal drip, rash, rhinorrhea, sore throat, sweats, weight loss or wheezing. Nothing aggravates the symptoms. She has tried a beta-agonist inhaler for the symptoms. The treatment provided no relief. Her past medical history is significant for environmental allergies. There is no history of COPD.  Urinary Tract Infection  This is a new problem. The current episode started 1 to 4 weeks ago. The problem occurs every urination. The problem has been unchanged. Quality: Pressure. The pain is mild. There has been no fever. There is no history of pyelonephritis. Associated symptoms include frequency. Pertinent negatives include no chills, discharge, flank pain, hematuria, hesitancy, nausea, possible pregnancy, sweats, urgency or vomiting. She has tried nothing for the symptoms.    Review of Systems  Constitutional: Negative for fever, chills and weight loss.  HENT: Negative for ear pain, postnasal drip, rhinorrhea and sore throat.   Respiratory: Positive for cough and shortness of breath. Negative for apnea, hemoptysis, choking, chest tightness, wheezing and stridor.   Cardiovascular: Negative for chest pain.  Gastrointestinal: Negative for heartburn, nausea, vomiting and diarrhea.  Genitourinary: Positive for frequency. Negative for hesitancy, urgency, hematuria and flank pain.  Musculoskeletal: Negative for myalgias.  Skin: Negative for rash.  Allergic/Immunologic: Positive for environmental allergies.  Neurological: Negative for  headaches.  All other systems reviewed and are negative.  Past Medical History  Diagnosis Date  . Hypertension     x many years (since birht of her son at 4029) Had workup with renal artery angiogram that showed no evidence for fibromuscular dysplasia ( no significant renal artery stenosis.) ECHO (8/11) EF 60-65%, mild LVH, no regional WMAs.  . Morbid obesity   . Allergic rhinitis   . Diabetes mellitus   . Hyperlipidemia   . Low back pain    Past Surgical History  Procedure Laterality Date  . Abdominal hysterectomy      partial for fibroids  . Tubal ligation      reports that she has never smoked. She has never used smokeless tobacco. She reports that she does not drink alcohol or use illicit drugs. family history includes Cancer in her mother; Hypertension in her mother; Other in her father. Allergies  Allergen Reactions  . Candesartan Cilexetil   . Codeine     Throat swelling  . Ibuprofen     hives  . Telmisartan   . Zithromax [Azithromycin]     diarrhea         Objective:   Physical Exam  Nursing note and vitals reviewed. Constitutional: She is oriented to person, place, and time. She appears well-developed and well-nourished. No distress.  HENT:  Head: Normocephalic and atraumatic.  Right Ear: External ear normal.  Left Ear: External ear normal.  Nose: Nose normal.  Mouth/Throat: Oropharynx is clear and moist. No oropharyngeal exudate.  Bilateral tympanic membranes appear normal.  Bilateral frontal and maxillary sinuses are nontender to palpation.   Eyes: Conjunctivae and EOM are normal. Pupils are equal, round, and reactive to light.  Neck: Normal range of motion. Neck supple. No JVD present. No tracheal deviation  present. No thyromegaly present.  Cardiovascular: Normal rate, regular rhythm, normal heart sounds and intact distal pulses.  Exam reveals no gallop and no friction rub.   No murmur heard. Pulmonary/Chest: Effort normal. No stridor. No respiratory  distress. She has no wheezes. She has no rales. She exhibits no tenderness.  Very mild bilateral rhonchi at lung bases.  Abdominal: Soft. Bowel sounds are normal. There is no tenderness.  Musculoskeletal: Normal range of motion. She exhibits no edema.  Lymphadenopathy:    She has no cervical adenopathy.  Neurological: She is alert and oriented to person, place, and time. She has normal reflexes. No cranial nerve deficit. Coordination normal.  Skin: Skin is warm and dry. No rash noted. She is not diaphoretic. No erythema. No pallor.  Psychiatric: She has a normal mood and affect. Her behavior is normal. Judgment and thought content normal.    Filed Vitals:   09/03/13 0816  BP: 132/88  Pulse: 76  Temp: 97.6 F (36.4 C)  Resp: 16    Lab Results  Component Value Date   WBC 5.6 06/24/2012   HGB 13.8 06/24/2012   HCT 40.9 06/24/2012   PLT 368.0 06/24/2012   GLUCOSE 119* 01/27/2013   CHOL 185 06/24/2012   TRIG 102.0 06/24/2012   HDL 49.30 06/24/2012   LDLCALC 115* 06/24/2012   ALT 28 06/24/2012   AST 24 06/24/2012   NA 139 01/27/2013   K 3.5 02/11/2013   CL 104 01/27/2013   CREATININE 0.8 01/27/2013   BUN 13 01/27/2013   CO2 30 01/27/2013   TSH 1.66 06/24/2012   INR 1.0 RATIO 07/08/2006   HGBA1C 7.5* 01/27/2013   MICROALBUR 2.2* 08/11/2007    Urinalysis Component     Latest Ref Rng 09/03/2013  Color, UA      yellow  Clarity, UA      cloudy  Glucose      n  Bilirubin, UA      n  Ketones, UA      n  Specific Gravity, UA      1.015  RBC, UA      n  pH, UA      6.5  Protein, UA      trace  Urobilinogen, UA      0.2  Nitrite, UA      neg  Leukocytes, UA      Negative       Assessment & Plan:  [name] DecemberSharon was seen today for bronchitis and urinary tract infection.  Diagnoses and associated orders for this visit:  Dysuria - POCT urinalysis dipstick - Culture, Urine  Acute bronchitis - predniSONE (DELTASONE) 20 MG tablet; 3 tablets daily for 3 days, 2 tablets daily for 3 days,  one tablet daily for 3 days.    Patient Instructions  The prescription for prednisone has been sent to your pharmacy.  We will culture your urine sample from today and call you with the results as soon as they are available.  Drink plenty of clear fluids to help recovery.  Followup to clinic if symptoms worsen or fail to improve despite treatment.

## 2013-09-05 LAB — URINE CULTURE: Colony Count: 45000

## 2013-10-09 ENCOUNTER — Other Ambulatory Visit: Payer: Self-pay | Admitting: Internal Medicine

## 2013-11-05 ENCOUNTER — Other Ambulatory Visit: Payer: Self-pay | Admitting: Internal Medicine

## 2013-11-08 ENCOUNTER — Telehealth: Payer: Self-pay | Admitting: Family Medicine

## 2013-11-08 ENCOUNTER — Other Ambulatory Visit: Payer: Self-pay | Admitting: Family Medicine

## 2013-11-08 DIAGNOSIS — Z0000 Encounter for general adult medical examination without abnormal findings: Secondary | ICD-10-CM

## 2013-11-08 MED ORDER — FELODIPINE ER 5 MG PO TB24: ORAL_TABLET | ORAL | Status: DC

## 2013-11-08 NOTE — Telephone Encounter (Signed)
Sent to the pharmacy by e-scribe. 

## 2013-11-08 NOTE — Telephone Encounter (Signed)
Pt should have lab work in Sept.  Scheduled for CPE in Nov and labs in Sept.  Pt is being followed by Dr. Elvera LennoxGherghe for her diabetes.  Advised she follow up with their office for A1C as she is past due.

## 2013-12-08 ENCOUNTER — Other Ambulatory Visit: Payer: BC Managed Care – PPO

## 2013-12-09 ENCOUNTER — Encounter: Payer: Self-pay | Admitting: Podiatry

## 2013-12-09 ENCOUNTER — Ambulatory Visit (INDEPENDENT_AMBULATORY_CARE_PROVIDER_SITE_OTHER): Payer: BC Managed Care – PPO

## 2013-12-09 ENCOUNTER — Ambulatory Visit (INDEPENDENT_AMBULATORY_CARE_PROVIDER_SITE_OTHER): Payer: BC Managed Care – PPO | Admitting: Podiatry

## 2013-12-09 VITALS — BP 179/100 | HR 76 | Resp 16

## 2013-12-09 DIAGNOSIS — E119 Type 2 diabetes mellitus without complications: Secondary | ICD-10-CM

## 2013-12-09 DIAGNOSIS — L600 Ingrowing nail: Secondary | ICD-10-CM

## 2013-12-09 DIAGNOSIS — M7750 Other enthesopathy of unspecified foot: Secondary | ICD-10-CM

## 2013-12-09 NOTE — Patient Instructions (Signed)

## 2013-12-09 NOTE — Progress Notes (Signed)
   Subjective:    Patient ID: [name], female    DOB: 06-21-1960, 53 y.o.   MRN: [id number]  HPI Comments: "I might have an infection"  Patient c/o tender 1st toe right, medial border, for about 2 weeks. She had a pedicure and noticed that the area started to get red and tender. There is slight numbness. She has been cleaning with alcohol and peroxide. No help.    Toe Pain       Review of Systems  Eyes: Positive for visual disturbance.  Gastrointestinal: Positive for abdominal distention.  Endocrine: Positive for polyuria.  All other systems reviewed and are negative.      Objective:   Physical Exam        Assessment & Plan:

## 2013-12-09 NOTE — Progress Notes (Signed)
Subjective:     Patient ID: [name] BorosSharon D Banfield, female   DOB: 23-Apr-1961, 53 y.o.   MRN: [id number]  Toe Pain    patient states she's had off and on problems with his right big toe and the pedicure is is no longer able to help her and she just is been getting pain in her feet in general and feels like the bones are broken   Review of Systems  All other systems reviewed and are negative.      Objective:   Physical Exam  Nursing note and vitals reviewed. Constitutional: She is oriented to person, place, and time.  Cardiovascular: Intact distal pulses.   Musculoskeletal: Normal range of motion.  Neurological: She is oriented to person, place, and time.  Skin: Skin is warm.   neurovascular status intact with muscle strength adequate and range of motion of the subtalar and midtarsal joint within normal limits. I found vibratory and sharp Dole to be intact and I noted that there is well-perfused digits and moderate arch height diminishment upon weightbearing. Patient's found to have an incurvated right hallux nail medial border and moderate discomfort in the lesser metatarsophalangeal joints of both feet and in the dorsum of the midtarsal joint of both feet     Assessment:     Ingrown toenail deformity right hallux medial border and inflammatory condition with possible arthritis of the feet in general    Plan:     H&P and x-rays reviewed. She states her sugar has been running good and that her A1c is been below 7 and we discussed correction of ingrown toenail. I explained risk and patient wants surgery and today I infiltrated 60 mg Xylocaine Marcaine mixture remove the medial border exposed matrix and apply chemical phenol 3 applications 30 seconds followed by alcohol lavaged and sterile dressing. Patient will use supportive shoe gear and will be seen back if feet continue to hurt her

## 2014-01-21 ENCOUNTER — Other Ambulatory Visit: Payer: BC Managed Care – PPO

## 2014-02-25 ENCOUNTER — Telehealth: Payer: Self-pay | Admitting: *Deleted

## 2014-02-25 NOTE — Telephone Encounter (Signed)
Patient called and stated that she never did get her xray results . Called and tried to speak with patient about her results, could not hear patient nor patient hear me. Something was wrong with phone line.

## 2014-03-09 ENCOUNTER — Other Ambulatory Visit: Payer: Self-pay | Admitting: Obstetrics & Gynecology

## 2014-03-11 LAB — CYTOLOGY - PAP

## 2014-03-22 ENCOUNTER — Ambulatory Visit (INDEPENDENT_AMBULATORY_CARE_PROVIDER_SITE_OTHER): Payer: BC Managed Care – PPO | Admitting: Internal Medicine

## 2014-03-22 ENCOUNTER — Encounter: Payer: Self-pay | Admitting: Internal Medicine

## 2014-03-22 VITALS — BP 150/74 | Temp 98.1°F | Ht 66.0 in | Wt 308.6 lb

## 2014-03-22 DIAGNOSIS — E1169 Type 2 diabetes mellitus with other specified complication: Secondary | ICD-10-CM

## 2014-03-22 DIAGNOSIS — R351 Nocturia: Secondary | ICD-10-CM

## 2014-03-22 DIAGNOSIS — Z0000 Encounter for general adult medical examination without abnormal findings: Secondary | ICD-10-CM

## 2014-03-22 DIAGNOSIS — Z2821 Immunization not carried out because of patient refusal: Secondary | ICD-10-CM

## 2014-03-22 DIAGNOSIS — Z1211 Encounter for screening for malignant neoplasm of colon: Secondary | ICD-10-CM

## 2014-03-22 DIAGNOSIS — M79672 Pain in left foot: Secondary | ICD-10-CM

## 2014-03-22 DIAGNOSIS — E119 Type 2 diabetes mellitus without complications: Secondary | ICD-10-CM

## 2014-03-22 DIAGNOSIS — M79671 Pain in right foot: Secondary | ICD-10-CM

## 2014-03-22 DIAGNOSIS — J9809 Other diseases of bronchus, not elsewhere classified: Secondary | ICD-10-CM

## 2014-03-22 DIAGNOSIS — I10 Essential (primary) hypertension: Secondary | ICD-10-CM

## 2014-03-22 DIAGNOSIS — E669 Obesity, unspecified: Secondary | ICD-10-CM

## 2014-03-22 LAB — LIPID PANEL
Cholesterol: 189 mg/dL (ref 0–200)
HDL: 48.2 mg/dL (ref >39.00)
LDL Cholesterol: 116 mg/dL — ABNORMAL HIGH (ref 0–99)
NonHDL: 140.8
Total CHOL/HDL Ratio: 4
Triglycerides: 124 mg/dL (ref 0.0–149.0)
VLDL: 24.8 mg/dL (ref 0.0–40.0)

## 2014-03-22 LAB — HEPATIC FUNCTION PANEL
ALT: 18 U/L (ref 0–35)
AST: 18 U/L (ref 0–37)
Albumin: 4 g/dL (ref 3.5–5.2)
Alkaline Phosphatase: 84 U/L (ref 39–117)
BILIRUBIN DIRECT: 0 mg/dL (ref 0.0–0.3)
BILIRUBIN TOTAL: 0.4 mg/dL (ref 0.2–1.2)
Total Protein: 7.9 g/dL (ref 6.0–8.3)

## 2014-03-22 LAB — TSH: TSH: 1.42 u[IU]/mL (ref 0.35–4.50)

## 2014-03-22 LAB — CBC WITH DIFFERENTIAL/PLATELET
Basophils Absolute: 0 10*3/uL (ref 0.0–0.1)
Basophils Relative: 0.6 % (ref 0.0–3.0)
EOS ABS: 0.1 10*3/uL (ref 0.0–0.7)
EOS PCT: 1.3 % (ref 0.0–5.0)
HCT: 44 % (ref 36.0–46.0)
Hemoglobin: 14.3 g/dL (ref 12.0–15.0)
LYMPHS PCT: 35.6 % (ref 12.0–46.0)
Lymphs Abs: 2.2 10*3/uL (ref 0.7–4.0)
MCHC: 32.4 g/dL (ref 30.0–36.0)
MCV: 93.7 fl (ref 78.0–100.0)
MONO ABS: 0.5 10*3/uL (ref 0.1–1.0)
Monocytes Relative: 8.4 % (ref 3.0–12.0)
Neutro Abs: 3.3 10*3/uL (ref 1.4–7.7)
Neutrophils Relative %: 54.1 % (ref 43.0–77.0)
PLATELETS: 382 10*3/uL (ref 150.0–400.0)
RBC: 4.7 Mil/uL (ref 3.87–5.11)
RDW: 13.5 % (ref 11.5–15.5)
WBC: 6.2 10*3/uL (ref 4.0–10.5)

## 2014-03-22 LAB — BASIC METABOLIC PANEL
BUN: 12 mg/dL (ref 6–23)
CHLORIDE: 104 meq/L (ref 96–112)
CO2: 25 mEq/L (ref 19–32)
CREATININE: 0.9 mg/dL (ref 0.4–1.2)
Calcium: 9 mg/dL (ref 8.4–10.5)
GFR: 88.22 mL/min (ref >60.00)
Glucose, Bld: 102 mg/dL — ABNORMAL HIGH (ref 70–99)
Potassium: 3.5 mEq/L (ref 3.5–5.1)
Sodium: 140 mEq/L (ref 135–145)

## 2014-03-22 LAB — MICROALBUMIN / CREATININE URINE RATIO
CREATININE, U: 335.7 mg/dL
MICROALB/CREAT RATIO: 2.4 mg/g (ref 0.0–30.0)
Microalb, Ur: 8.2 mg/dL — ABNORMAL HIGH (ref 0.0–1.9)

## 2014-03-22 LAB — HEMOGLOBIN A1C: HEMOGLOBIN A1C: 7.2 % — AB (ref 4.6–6.5)

## 2014-03-22 MED ORDER — BECLOMETHASONE DIPROPIONATE 40 MCG/ACT IN AERS: 2.0000 | INHALATION_SPRAY | Freq: Two times a day (BID) | RESPIRATORY_TRACT | Status: DC

## 2014-03-22 NOTE — Progress Notes (Signed)
Pre visit review using our clinic review tool, if applicable. No additional management support is needed unless otherwise documented below in the visit note.  Chief Complaint  Patient presents with  . Annual Exam    HPI: Patient comes in today for Preventive Health Care  [name] 53 y.o. with DM HT prob OSA  moprbid obesity with hx of sensitivity to many medicines to med and non adherance to regimen  And failure to fu and communicage with fu  regarding diabetes hypertension difficulties Dm was seen by dr Reece Agar  Not taking metformin Bcause serious diarrhea she does have a follow-up appointment in [place] schedule for  Dec 8th .   Checking  at work  Getting 125 range .[name] Ht was  Given cardiology and multiple medicines in the past 150/ 147  Range the current medication doesn't cause side effect as if she could go up to a double dose. OSA  Ever got the sleep study for various reasons now says she is ready good to go do that before the end of the year because of the doctor but also she is tired of being tired. She is highly suspected of having sleep apnea she has nocturia very frequently every night and says she doesn't get any sleep. This has been an ongoing problem she is not taking Lasix and only as needed. She states that every fall in November she tends to get wheezy and uses albuterol inhaler often ends up in an urgent care with bronchitis on prednisone. Thinks it could be the dry heat or something else she has been using albuterol every few days at this time and not severe.   Health Maintenance  Topic Date Due  . PNEUMOCOCCAL POLYSACCHARIDE VACCINE (1) 01/24/1965  . FOOT EXAM  01/24/1973  . OPHTHALMOLOGY EXAM  01/24/1973  . TETANUS/TDAP  08/31/2006  . URINE MICROALBUMIN  08/10/2008  . MAMMOGRAM  01/24/2013  . COLONOSCOPY  01/24/2013  . HEMOGLOBIN A1C  07/27/2013  . INFLUENZA VACCINE  08/04/2014 (Originally 12/04/2013)  . PAP SMEAR  03/09/2017   Health Maintenance  Review LIFESTYLE:  Exercise:   Walk at lunch   Sometimes gym Tobacco/ETS: no no Alcohol: no Sugar beverages:  Tea  Doing better  Or 1/2 ocass coke  Sleep:  "Zero sleep"  Getting up to pea.  Drug use: no Colonoscopy: no yet  Working Comptroller done Up-to-date on Pap smear recent check  ROS: states she has pain on the top of her foot all the time has seen the podiatrist no knows what it is wonders if it's from diabetes. Denies numbness however. GEN/ HEENT: No fever, significant weight changes sweats headaches vision problems hearing changes, CV/ PULM; No chest pain shortness of breath cough, syncope,edema  change in exercise tolerance. GI /GU: No adominal pain, vomiting, change in bowel habits. No blood in the stool. No significant GU symptoms. SKIN/HEME: ,no acute skin rashes suspicious lesions or bleeding. No lymphadenopathy, nodules, masses.  NEURO/ PSYCH:  No neurologic signs such as weakness numbness. No depression anxiety. IMM/ Allergy: No unusual infections.  Allergy .   REST of 12 system review negative except as per HPI   Past Medical History  Diagnosis Date  . Hypertension     x many years (since birht of her son at 7) Had workup with renal artery angiogram that showed no evidence for fibromuscular dysplasia ( no significant renal artery stenosis.) ECHO (8/11) EF 60-65%, mild LVH, no regional WMAs.  . Morbid obesity   .  Allergic rhinitis   . Diabetes mellitus   . Hyperlipidemia   . Low back pain    Past Surgical History  Procedure Laterality Date  . Abdominal hysterectomy      partial for fibroids  . Tubal ligation       Family History  Problem Relation Age of Onset  . Cancer Mother   . Hypertension Mother   . Other Father     died in MVA    History   Social History  . Marital Status: Married    Spouse Name: N/A    Number of Children: N/A  . Years of Education: N/A   Social History Main Topics  . Smoking status: Never Smoker   .  Smokeless tobacco: Never Used  . Alcohol Use: No  . Drug Use: No  . Sexual Activity: None   Other Topics Concern  . None   Social History Narrative   Married   Regular exercise-yes   HH of 4    28 and 22    No pets   Works at [employer]          Outpatient Encounter Prescriptions as of 03/22/2014  Medication Sig  . felodipine (PLENDIL) 5 MG 24 hr tablet TAKE 1 TABLET DAILY  . furosemide (LASIX) 40 MG tablet Take 1 tablet (40 mg total) by mouth daily.  [name]. KLOR-CON M20 20 MEQ tablet TAKE 1 TABLET DAILY  . Multiple Vitamin (MULTIVITAMIN) tablet Take 1 tablet by mouth daily.    . potassium chloride SA (K-DUR,KLOR-CON) 20 MEQ tablet Take 2 tablets (40 mEq total) by mouth daily. (Patient taking differently: Take 20 mEq by mouth daily. )  . beclomethasone (QVAR) 40 MCG/ACT inhaler Inhale 2 puffs into the lungs 2 (two) times daily. Controller in season  . metFORMIN (GLUCOPHAGE XR) 500 MG 24 hr tablet Take 1 tablet (500 mg total) by mouth 2 (two) times daily.    EXAM:  BP 150/74 mmHg  Temp(Src) 98.1 F (36.7 C) (Oral)  Ht 5\' 6"  (1.676 m)  Wt 308 lb 9.6 oz (139.98 kg)  BMI 49.83 kg/m2  Body mass index is 49.83 kg/(m^2).  Physical Exam: Vital signs reviewed ZOX:WRUEGEN:This is a well-developed well-nourished alert cooperative    who appearsr stated age in no acute distress. She looks sleepy is usable but pleasant and cognitively intact HEENT: normocephalic atraumatic , Eyes: PERRL EOM's full, conjunctiva clear, Nares: paten,t no deformity discharge or tenderness., Ears: no deformity EAC's clear TMs with normal landmarks. Mouth: clear OP, no lesions, edema.  Moist mucous membranes. Dentition in adequate repair. NECK: supple without masses, thyromegaly or bruits. CHEST/PULM:  Clear to auscultation and percussion breath sounds equal no wheeze , rales or rhonchi. No chest wall deformities or tenderness. CV: PMI is nondisplaced, S1 S2 no gallops, murmurs, rubs. Peripheral pulses are full  without delay.No JVD . Breast: normal by inspection . No dimpling, discharge, masses, tenderness or discharge . ABDOMEN: Bowel sounds normal nontender  No guard or rebound, no hepato splenomegal no CVA tenderness.  No hernia. Extremtities:  No clubbing cyanosis +1 edema no ulceration of varicose veins no acute joint swelling or redness no focal atrophy NEURO:  Oriented x3, cranial nerves 3-12 appear to be intact, no obvious focal weakness,gait within normal limits no abnormal reflexes or asymmetrical SKIN: No acute rashes normal turgor, color, no bruising or petechiae. PSYCH: Oriented, good eye contact, no obvious depression anxiety, cognition and judgment appear normal. LN: no cervical axillary inguinal adenopathy Diabetic foot  exam does have some thickening of the nails others normal pulses intact feels monofilament no ulcers  Lab Results  Component Value Date   WBC 5.6 06/24/2012   HGB 13.8 06/24/2012   HCT 40.9 06/24/2012   PLT 368.0 06/24/2012   GLUCOSE 119* 01/27/2013   CHOL 185 06/24/2012   TRIG 102.0 06/24/2012   HDL 49.30 06/24/2012   LDLCALC 115* 06/24/2012   ALT 28 06/24/2012   AST 24 06/24/2012   NA 139 01/27/2013   K 3.5 02/11/2013   CL 104 01/27/2013   CREATININE 0.8 01/27/2013   BUN 13 01/27/2013   CO2 30 01/27/2013   TSH 1.66 06/24/2012   INR 1.0 RATIO 07/08/2006   HGBA1C 7.5* 01/27/2013   MICROALBUR 2.2* 08/11/2007   Wt Readings from Last 3 Encounters:  03/22/14 308 lb 9.6 oz (139.98 kg)  09/03/13 312 lb 8 oz (141.749 kg)  03/09/13 305 lb (138.347 kg)   BP Readings from Last 3 Encounters:  03/22/14 150/74  12/09/13 179/100  09/03/13 132/88     ASSESSMENT AND PLAN:  Discussed the following assessment and plan:  Visit for preventive health examination - Plan: Basic metabolic panel, CBC with Differential, Hemoglobin A1c, Hepatic function panel, Lipid panel, TSH, Microalbumin / creatinine urine ratio  Diabetes mellitus without complication - Plan: Basic  metabolic panel, CBC with Differential, Hemoglobin A1c, Hepatic function panel, Lipid panel, TSH, Microalbumin / creatinine urine ratio  Influenza vaccination declined  Essential hypertension, benign - rarely controlled  hx of med sensitivieies  - Plan: Basic metabolic panel, CBC with Differential, Hemoglobin A1c, Hepatic function panel, Lipid panel, TSH, Microalbumin / creatinine urine ratio  Pain in both feet - Plan: Basic metabolic panel, CBC with Differential, Hemoglobin A1c, Hepatic function panel, Lipid panel, TSH, Microalbumin / creatinine urine ratio  Nocturia  Morbid obesity  Diabetes mellitus type 2 in obese  Recurrent bronchospasm - suspect seasonal asthmatic component responsive bronchodilators; discussion topical steroids inhaled to prevent flares during season prescription given instruct Call .[name].  Pulmonary about sleep study.  She is overdue for her lab monitoring will get A1c urine microalbumin and lipid panel in her CPX labs today should be available for endocrinology to review for her appointment. Discussed colonoscopy colon cancer screening will put in referral for this. Patient Care Team: [name], MD as PCP - General Claudie ReveringFrederick Arthur Lupton III, MD (Dermatology) Dorothyann Pengobyn Sanders, MD as Attending Physician (Internal Medicine) Carlus Pavlovristina Gherghe, MD as Consulting Physician (Internal Medicine) Patient Instructions  Will notify you  of labs when available. And will send to dr G If your need for rescue inhaler continues or increases begin controller inhaler that has a low dose of topical cortisone to prevent wheezing from occurring. You can take this every day during this risky season and it may prevent acute wheezing bronchitis attacks and the need for pills of cortisone.  Keep your appointment with endocrinology contact pulmonary  I really want you to get your sleep study I think it will help you feel better to get this information. Flu vaccine is recommended with  diabetes and anyone with an asthmatic tendency get back with us if you change your mind. blood pressure would be better consider retry other medicines if not below 140/90. We'll send in referral for colonoscopy for colon cancer screening as we discussed    Burna MortimerWanda K. Panosh M.D.

## 2014-03-22 NOTE — Patient Instructions (Addendum)
Will notify you  of labs when available. And will send to dr G If your need for rescue inhaler continues or increases begin controller inhaler that has a low dose of topical cortisone to prevent wheezing from occurring. You can take this every day during this risky season and it may prevent acute wheezing bronchitis attacks and the need for pills of cortisone.  Keep your appointment with endocrinology contact pulmonary  I really want you to get your sleep study I think it will help you feel better to get this information. Flu vaccine is recommended with diabetes and anyone with an asthmatic tendency get back with us if you change your mind. blood pressure would be better consider retry other medicines if not below 140/90. We'll send in referral for colonoscopy for colon cancer screening as we discussed

## 2014-03-25 ENCOUNTER — Telehealth: Payer: Self-pay | Admitting: Internal Medicine

## 2014-03-25 NOTE — Telephone Encounter (Signed)
Pt is having trouble with her home computer and can not get on mychart. Pt would like copy of blood work results mail to her home address

## 2014-03-25 NOTE — Telephone Encounter (Signed)
Sent lab results by mail.

## 2014-04-12 ENCOUNTER — Ambulatory Visit (INDEPENDENT_AMBULATORY_CARE_PROVIDER_SITE_OTHER): Payer: BC Managed Care – PPO | Admitting: Internal Medicine

## 2014-04-12 ENCOUNTER — Encounter: Payer: Self-pay | Admitting: Internal Medicine

## 2014-04-12 VITALS — BP 132/84 | HR 73 | Temp 98.2°F | Resp 12 | Wt 314.0 lb

## 2014-04-12 DIAGNOSIS — R600 Localized edema: Secondary | ICD-10-CM

## 2014-04-12 DIAGNOSIS — E119 Type 2 diabetes mellitus without complications: Secondary | ICD-10-CM

## 2014-04-12 DIAGNOSIS — E269 Hyperaldosteronism, unspecified: Secondary | ICD-10-CM

## 2014-04-12 DIAGNOSIS — E1169 Type 2 diabetes mellitus with other specified complication: Secondary | ICD-10-CM

## 2014-04-12 DIAGNOSIS — E669 Obesity, unspecified: Secondary | ICD-10-CM

## 2014-04-12 NOTE — Patient Instructions (Signed)
Please continue diet and exercise as you are already doing.  Please come back for a follow-up appointment in 3 months.  Please let me know what test strips you need.

## 2014-04-12 NOTE — Progress Notes (Signed)
Subjective:     Patient ID: [name] BorosSharon D Medlin, female   DOB: 1960-06-15, 53 y.o.   MRN: [id number]  HPI  [name] is a 53 y.o.-year-old female, presenting for f/u for DM2, dx 11/2012 per her report, 2010 per chart review, non-insulin-dependent, uncontrolled, without complications, also for HTN/hyperaldosteronism/bilateral leg edema. Last visit 1 year and 1 mo ago!  DM2: Last hemoglobin A1c was: Lab Results  Component Value Date   HGBA1C 7.2* 03/22/2014   HGBA1C 7.5* 01/27/2013   HGBA1C 7.5* 12/30/2012    Pt is not on meds for DM.  She was on Metformin XR 500 mg po daily >> not taking it b/c diarrhea  At last visit, she was not checking sugars but we taught her how to do it and gave her a meter/strips.  She is not checking at home, but checks at work. Sugars - 1x a day: - 89-129 fasting >> n/c - 2h after b'fast: 121-126 - 2h after lunch: 130s ? hypoglycemia awareness.  Highest sugar was 137.  She reduced the amount of carbs and sodas. She is back in the gym, and is feeling great!   - no CKD, last BUN/creatinine:  Lab Results  Component Value Date   BUN 12 03/22/2014   CREATININE 0.9 03/22/2014   - last set of lipids: Lab Results  Component Value Date   CHOL 189 03/22/2014   HDL 48.20 03/22/2014   LDLCALC 116* 03/22/2014   TRIG 124.0 03/22/2014   CHOLHDL 4 03/22/2014   - last eye exam was 4 years. No DR. She will go for a new one soon. - + numbness and tingling in her R toe - but had foot surgery in the past  HTN: - 150-164/80-90 >>   at work in last month. Today 132/84. Still feels very stressed at work  She is on: - on Felodipine (switched from Amlodipine at last visit) 5 mg >> did not increase to 10 mg daily yet - takes Lasix 20 mg as needed - takes HCTZ as needed for leg edema prn - takes it 4-5x a year  Pt was also on: - labetalol >> unclear why stopped - Coreg >> never filled the Rx - Bystolic >> had samples in the past, did not continue -  clonidine >> intolerance due to fatigue - spironolactone >> intolerance due to diarrhea, fatigue - ARB's >> intolerance, ? Cause - Tekturna >> intolerance,? Cause  - Chlorthalidone >> intolerance, ? Cause - leg cramps?  Contributing factors: - OSA >> needs sleep study but it is expensive >> d/w insurance (apparently has a 700$ deductible for this) - no NSAIDs - no coffee use - no alcohol - no smoking - no licorice - is limiting salt - is exercising regularly  She lost 8 pounds in the last year, but gained some during Thanksgiving.  She has leg swelling - especially with Norvasc, now better with felodipine, but not resolved.  She was dx with hypokalemia before starting diuretics. Cannot remember if potassium low before starting these ~4 years ago, per her report.   Last K: Lab Results  Component Value Date   K 3.5 03/22/2014   We checked Aldosterone and PRA in the past - aldo high, PRA suppressed: 01/15/2011: Potassium 2.9 Aldosterone 16 PRA 0.20 Aldosterone/PRA 80 Cortisol 6.5 at 9 AM  02/25/2011:  Potassium 3.4 Aldosterone 22 PRA 0.18 Aldosterone/PRA 122  02/11/2013: Potassium 3.5 Aldosterone 23 PRA 0.16 Aldosterone/PRA 144  I asked her to have a saline suppression  test at last visit, but she does not want to go FWD with this for now, but work on diet and exercise and on her meds.  Review of Systems Constitutional: no weight gain/loss, + fatigue, no subjective hyperthermia/hypothermia, + nocturia, + poor sleep Eyes: no blurry vision, no xerophthalmia ENT: no sore throat, no nodules palpated in throat, no dysphagia/odynophagia, no hoarseness Cardiovascular: no CP/SOB/palpitations/leg swelling Respiratory: no cough/SOB Gastrointestinal: no N/V/D/C Musculoskeletal: no muscle/joint aches Skin: no rashes Neurological: no tremors/numbness/tingling/dizziness  I reviewed pt's medications, allergies, PMH, social hx, family hx and no changes required, except as  mentioned above.  Objective:   Physical Exam BP 132/84 mmHg  Pulse 73  Temp(Src) 98.2 F (36.8 C) (Oral)  Resp 12  Wt 314 lb (142.429 kg)  SpO2 97% Wt Readings from Last 3 Encounters:  04/12/14 314 lb (142.429 kg)  03/22/14 308 lb 9.6 oz (139.98 kg)  09/03/13 312 lb 8 oz (141.749 kg)   Constitutional: obese, in NAD Eyes: PERRLA, EOMI, no exophthalmos ENT: moist mucous membranes, no thyromegaly, no cervical lymphadenopathy Cardiovascular: RRR, No MRG, + significant pitting bilateral leg edema Respiratory: CTA B Gastrointestinal: abdomen soft, NT, ND, BS+ Musculoskeletal: no deformities, strength intact in all 4 Skin: moist, warm, no rashes Neurological: no tremor with outstretched hands, DTR normal in all 4   Assessment:     1. HTN - Left renal artery ? proximal fibromuscular dysplasia per MRA 10/28/2014 - also, at that time, a probably simple cyst of 2 cm was seen in the upper pole of the right kidney >> pt had renal angiography in 07/2006 >> did not show FMD (Dr Samule Ohm): FINDINGS:  1. Abdominal aorta: Normal abdominal aorta with no atherosclerotic  plaque and no aneurysm.  2. Left renal arteries: There are two left renal arteries of roughly  equal in size. Both are angiographically normal. The left kidney  appears normal in size.  3. Right renal artery: Single vessel. It is angiographically normal.  The right kidney appears normal in size.  IMPRESSION/RECOMMENDATIONS: Angiographically normal renal arteries.  There is no suggestion of fibromuscular dysplasia.  - hyperaldosteronism 2012 per checks by Dr. Everardo All - patient not on beta blockers, ACE inhibitors, or ARBs at the time 01/15/2011: Potassium 2.9 Aldosterone 16 PRA 0.20 Aldosterone/PRA 80 Cortisol 6.5 at 9 AM  02/25/2011:  Potassium 3.4 Aldosterone 22 PRA 0.18 Aldosterone/PRA 122  02/11/2013: Potassium 3.5 Aldosterone 23 PRA 0.16 Aldosterone/PRA 144  2. Hyperaldosteronism - per tests 2012 and 2014  - see above - ? If primary or secondary >> saline loading test ordered as a confirmatory test for Primary hyperaldosteronism, but pt did not go through with it   3. DM2     Plan:     1. HTN - BP today ok, at 130/84 - definitely needs split night study for OSA >> works on having this done - pt not smoking/drinking alcohol/drinking coffee - pt not using NSAIDs - advised for importance of reducing salt intake - again discussed about getting the confirmatory test for primary hyperaldosteronism, but would like to wait for now and see if she can manage HTN with diet and exercise  2. Hyperaldosteronism - patient was evaluated by Dr. Everardo All in 2012 for primary hyperaldosteronism, and she had 2 separate checks, during which the aldosterone was slightly elevated, and the PRA was suppressed, resulting in a high aldosterone/PRA ratio. However, the potassium levels were subnormal at the time of the check (improved and the second check), which could suppress the RAAS, rendering  the evaluation inconclusive. We rechecked the above labs and the Aldo still high and PRA suppressed - see above - unfortunately, she could not tolerate spironolactone in the past because of diarrhea; we can also try Eplerenone ...  3. Severe bilateral leg edema - This appears to be related to the dihydropyridine calcium channel blocker, since patient remembers that it started right after addition of Norvasc > 10 years ago.  - we switched to felodipine as this has less leg edema capacity >> edema a little better - adviced her to get compression stockings, as initially advised by Dr. Fabian SharpPanosh >> did not start - if she has obstructive sleep apnea, compliance with CPAP should help  5. Patient with long-standing, recently more controlled diabetes, diet-controlled, with fairly good sugar control. She stopped Metformin XR >> could not tolerate it - I suggested to check sugars at home, too, not only at work - advised for yearly eye exams  >> need to get new one - refuses a flu vaccine today >> afraid she would get sick - Return to clinic in 3 mo with sugar log

## 2014-04-14 ENCOUNTER — Telehealth: Payer: Self-pay | Admitting: Internal Medicine

## 2014-04-14 NOTE — Telephone Encounter (Signed)
Pt was told to increase felodipine 5 mg to twice a day total of 10 mg a day. Pt would new rx felodipine 5 mg twice a day #[facility] . Pt does not want felodipine 10 mg pt would like to take med twice a day. Pt is aware md out of office until 04/18/14

## 2014-04-15 ENCOUNTER — Other Ambulatory Visit: Payer: Self-pay | Admitting: *Deleted

## 2014-04-15 ENCOUNTER — Telehealth: Payer: Self-pay | Admitting: Internal Medicine

## 2014-04-15 MED ORDER — GLUCOSE BLOOD VI STRP: ORAL_STRIP | Status: DC

## 2014-04-15 MED ORDER — FELODIPINE ER 5 MG PO TB24: ORAL_TABLET | ORAL | Status: DC

## 2014-04-15 NOTE — Telephone Encounter (Signed)
Done

## 2014-04-15 NOTE — Telephone Encounter (Signed)
Patient stated that the free style lite is the type of meter she is using, [facility]

## 2014-04-15 NOTE — Telephone Encounter (Signed)
Sent to the pharmacy by e-scribe. 

## 2014-04-18 ENCOUNTER — Other Ambulatory Visit: Payer: Self-pay | Admitting: *Deleted

## 2014-04-18 MED ORDER — GLUCOSE BLOOD VI STRP: ORAL_STRIP | Status: DC

## 2014-06-15 ENCOUNTER — Encounter (INDEPENDENT_AMBULATORY_CARE_PROVIDER_SITE_OTHER): Payer: Self-pay

## 2014-06-15 ENCOUNTER — Ambulatory Visit (INDEPENDENT_AMBULATORY_CARE_PROVIDER_SITE_OTHER): Payer: BLUE CROSS/BLUE SHIELD | Admitting: Pulmonary Disease

## 2014-06-15 ENCOUNTER — Encounter: Payer: Self-pay | Admitting: Pulmonary Disease

## 2014-06-15 VITALS — BP 160/88 | HR 77 | Ht 67.0 in | Wt 316.0 lb

## 2014-06-15 DIAGNOSIS — R0683 Snoring: Secondary | ICD-10-CM

## 2014-06-15 NOTE — Progress Notes (Signed)
Chief Complaint  Patient presents with  . Follow-up    Pt never treated for sleep apnea d/t insurance issues. Pt states that she has new insurance.     History of Present Illness: [name] is a 54 y.o. female with snoring.  I last saw her in January 2014.  Concern at that time was for sleep apnea.  Insurance denied in lab sleep study, and she was not able to set up home sleep study.  She continues to have trouble with snoring.  She will wake up feeling like she can't breath, and has been told she stops breathing while asleep.  She feels tired during the day.    TESTS: Echo 12/11/09 >> mild LVH, EF 60 to 65%  Past medical hx >> HTN, DM, rhinitis, HLD  Past surgical hx, Medications, Allergies, Family hx, Social hx all reviewed.   Physical Exam: Blood pressure 160/88, pulse 77, height 5\' 7"  (1.702 m), weight 316 lb (143.337 kg), SpO2 95 %. Body mass index is 49.48 kg/(m^2).   General - No distress ENT - No sinus tenderness, no oral exudate, no LAN Cardiac - s1s2 regular, no murmur Chest - No wheeze/rales/dullness Back - No focal tenderness Abd - Soft, non-tender Ext - No edema Neuro - Normal strength Skin - No rashes Psych - normal mood, and behavior  Discussion: She has continued trouble with snoring, sleep disruption, daytime sleepiness, and witnessed apnea.  She has hx of DM, HTN.  Her BMI is > 35.  I am still concerned she could have sleep apnea.  Assessment/Plan:  Snoring. Plan: - will arrange for home sleep study pending insurance approval to further assess for sleep apnea  Obesity. Plan: - discussed different options for diet modification and exercise to assist with weight loss   Coralyn HellingVineet Alayiah Fontes, MD  Pulmonary/Critical Care/Sleep Pager:  [phone]

## 2014-06-15 NOTE — Patient Instructions (Signed)
Will arrange for home sleep study Will call to arrange for follow up after sleep study reviewed  

## 2014-07-11 ENCOUNTER — Ambulatory Visit (INDEPENDENT_AMBULATORY_CARE_PROVIDER_SITE_OTHER): Payer: BLUE CROSS/BLUE SHIELD | Admitting: Internal Medicine

## 2014-07-11 ENCOUNTER — Encounter: Payer: Self-pay | Admitting: Internal Medicine

## 2014-07-11 VITALS — BP 140/70 | HR 93 | Temp 98.3°F | Resp 20 | Ht 67.0 in | Wt 318.0 lb

## 2014-07-11 DIAGNOSIS — E119 Type 2 diabetes mellitus without complications: Secondary | ICD-10-CM

## 2014-07-11 DIAGNOSIS — R351 Nocturia: Secondary | ICD-10-CM

## 2014-07-11 DIAGNOSIS — I10 Essential (primary) hypertension: Secondary | ICD-10-CM

## 2014-07-11 DIAGNOSIS — R682 Dry mouth, unspecified: Secondary | ICD-10-CM

## 2014-07-11 LAB — GLUCOSE, POCT (MANUAL RESULT ENTRY): POC GLUCOSE: 143 mg/dL — AB (ref 70–99)

## 2014-07-11 NOTE — Progress Notes (Signed)
Subjective:    Patient ID: [name], female    DOB: 01-06-1961, 54 y.o.   MRN: [id number]  HPI 54 year old patient, his medical issues include essential hypertension, morbid obesity and diet-controlled diabetes. She presents today with multiple complaints including episodic wheezing.  She has been on Qvar but she has not taken this consistently.  She complains of nocturia dry mouth and a sense of feeling dehydrated.  She complains of low back, flank and anterior chest wall pain with activity and movement such as bending and twisting at the waist  Past Medical History  Diagnosis Date  . Hypertension     x many years (since birht of her son at [age]) Had workup with renal artery angiogram that showed no evidence for fibromuscular dysplasia ( no significant renal artery stenosis.) ECHO (8/11) EF 60-65%, mild LVH, no regional WMAs.  . Morbid obesity   . Allergic rhinitis   . Diabetes mellitus   . Hyperlipidemia   . Low back pain     History   Social History  . Marital Status: Married    Spouse Name: N/A  . Number of Children: N/A  . Years of Education: N/A   Occupational History  . Not on file.   Social History Main Topics  . Smoking status: Never Smoker   . Smokeless tobacco: Never Used  . Alcohol Use: No  . Drug Use: No  . Sexual Activity: Not on file   Other Topics Concern  . Not on file   Social History Narrative   Married   Regular exercise-yes   HH of 4    26 and 22    No pets   Works at [employer]          Past Surgical History  Procedure Laterality Date  . Abdominal hysterectomy      partial for fibroids  . Tubal ligation      Family History  Problem Relation Age of Onset  . Cancer Mother   . Hypertension Mother   . Other Father     died in MVA    Allergies  Allergen Reactions  . Candesartan Cilexetil   . Codeine     Throat swelling  . Ibuprofen     hives  . Telmisartan   . Zithromax [Azithromycin]     diarrhea      Current Outpatient Prescriptions on File Prior to Visit  Medication Sig Dispense Refill  . beclomethasone (QVAR) 40 MCG/ACT inhaler Inhale 2 puffs into the lungs 2 (two) times daily. Controller in season 1 Inhaler 12  . felodipine (PLENDIL) 5 MG 24 hr tablet TAKE 2 TABLETS DAILY (Patient taking differently: Take 5-10 mg by mouth daily. ) 180 tablet 0  . furosemide (LASIX) 40 MG tablet Take 1 tablet (40 mg total) by mouth daily. (Patient taking differently: Take 40 mg by mouth daily as needed. ) 30 tablet 1  . glucose blood (ONETOUCH VERIO) test strip Use to test blood sugar 1 time daily as instructed. 100 each 3  . KLOR-CON M20 20 MEQ tablet TAKE 1 TABLET DAILY 90 tablet 1  . Multiple Vitamin (MULTIVITAMIN) tablet Take 1 tablet by mouth daily.      . [DISCONTINUED] potassium chloride (KLOR-CON) 20 MEQ packet Take 40 mEq by mouth daily. 60 tablet 2   No current facility-administered medications on file prior to visit.    BP 140/70 mmHg  Pulse 93  Temp(Src) 98.3 F (36.8 C) (Oral)  Resp 20  Ht   (1.702 m)  Wt 318 lb (144.244 kg)  BMI 49.79 kg/m2  SpO2 94%       Review of Systems  Constitutional: Positive for fatigue. Negative for chills.  HENT: Negative for congestion, dental problem, hearing loss, rhinorrhea, sinus pressure, sore throat and tinnitus.   Eyes: Negative for pain, discharge and visual disturbance.  Respiratory: Positive for wheezing. Negative for cough and shortness of breath.   Cardiovascular: Positive for chest pain. Negative for palpitations and leg swelling.  Gastrointestinal: Negative for nausea, vomiting, abdominal pain, diarrhea, constipation, blood in stool and abdominal distention.  Genitourinary: Negative for dysuria, urgency, frequency, hematuria, flank pain, vaginal bleeding, vaginal discharge, difficulty urinating, vaginal pain and pelvic pain.  Musculoskeletal: Positive for myalgias and back pain. Negative for joint swelling, arthralgias and  gait problem.  Skin: Negative for rash.  Neurological: Negative for dizziness, syncope, speech difficulty, weakness, numbness and headaches.  Hematological: Negative for adenopathy.  Psychiatric/Behavioral: Negative for behavioral problems, dysphoric mood and agitation. The patient is not nervous/anxious.        Objective:   Physical Exam  Constitutional: She is oriented to person, place, and time. She appears well-developed and well-nourished.  Obese Repeat blood pressure 130/70 Afebrile No distress  HENT:  Head: Normocephalic.  Right Ear: External ear normal.  Left Ear: External ear normal.  Mouth/Throat: Oropharynx is clear and moist.  Eyes: Conjunctivae and EOM are normal. Pupils are equal, round, and reactive to light.  Neck: Normal range of motion. Neck supple. No thyromegaly present.  Cardiovascular: Normal rate, regular rhythm, normal heart sounds and intact distal pulses.   Pulmonary/Chest: Effort normal and breath sounds normal. No respiratory distress. She has no wheezes. She has no rales.  Abdominal: Soft. Bowel sounds are normal. She exhibits no mass. There is no tenderness.  Musculoskeletal: Normal range of motion.  Lymphadenopathy:    She has no cervical adenopathy.  Neurological: She is alert and oriented to person, place, and time.  Skin: Skin is warm and dry. No rash noted.  Psychiatric: She has a normal mood and affect. Her behavior is normal.          Assessment & Plan:   Hypertension, controlled Diet-controlled diabetes.  Will check a hemoglobin A1c.  Patient is very concerned about kidney function.  We'll also check a renal indices History of wheezing.  Patient was asked to take Qvar daily  Follow-up PCP in 3 months or as needed

## 2014-07-11 NOTE — Patient Instructions (Signed)
Use Qvar 2 puffs daily  Call or return to clinic prn if these symptoms worsen or fail to improve as anticipated.     It is important that you exercise regularly, at least 20 minutes 3 to 4 times per week.  If you develop chest pain or shortness of breath seek  medical attention.  You need to lose weight.  Consider a lower calorie diet and regular exercise.   Please check your hemoglobin A1c every 3 months

## 2014-07-11 NOTE — Progress Notes (Signed)
Pre visit review using our clinic review tool, if applicable. No additional management support is needed unless otherwise documented below in the visit note. 

## 2014-07-12 ENCOUNTER — Ambulatory Visit: Payer: BC Managed Care – PPO | Admitting: Internal Medicine

## 2014-07-12 ENCOUNTER — Other Ambulatory Visit: Payer: Self-pay | Admitting: *Deleted

## 2014-07-12 ENCOUNTER — Telehealth: Payer: Self-pay | Admitting: Internal Medicine

## 2014-07-12 LAB — HEMOGLOBIN A1C: Hgb A1c MFr Bld: 7.6 % — ABNORMAL HIGH (ref 4.6–6.5)

## 2014-07-12 LAB — BASIC METABOLIC PANEL
BUN: 11 mg/dL (ref 6–23)
CO2: 33 mEq/L — ABNORMAL HIGH (ref 19–32)
Calcium: 9 mg/dL (ref 8.4–10.5)
Chloride: 99 mEq/L (ref 96–112)
Creatinine, Ser: 0.82 mg/dL (ref 0.40–1.20)
GFR: 94.35 mL/min (ref >60.00)
GLUCOSE: 157 mg/dL — AB (ref 70–99)
Potassium: 3.2 mEq/L — ABNORMAL LOW (ref 3.5–5.1)
Sodium: 137 mEq/L (ref 135–145)

## 2014-07-12 MED ORDER — METFORMIN HCL ER 500 MG PO TB24: 1000.0000 mg | ORAL_TABLET | Freq: Every day | ORAL | Status: DC

## 2014-07-12 NOTE — Telephone Encounter (Signed)
emmi emailed °

## 2014-08-06 ENCOUNTER — Other Ambulatory Visit: Payer: Self-pay | Admitting: Internal Medicine

## 2014-08-08 NOTE — Telephone Encounter (Signed)
Sent to the pharmacy by e-scribe.  Pt should come back for follow up in June.

## 2014-08-11 ENCOUNTER — Telehealth: Payer: Self-pay | Admitting: Internal Medicine

## 2014-08-11 MED ORDER — POTASSIUM CHLORIDE CRYS ER 20 MEQ PO TBCR: 20.0000 meq | EXTENDED_RELEASE_TABLET | Freq: Every day | ORAL | Status: DC

## 2014-08-11 NOTE — Telephone Encounter (Signed)
Sent to the pharmacy by e-scribe. 

## 2014-08-11 NOTE — Telephone Encounter (Signed)
Pt is waiting on mail order and needs 7 day supply of klor-con 20 meq sent to [facility]. Pt needs med today

## 2014-08-17 ENCOUNTER — Encounter: Payer: Self-pay | Admitting: Internal Medicine

## 2014-08-17 ENCOUNTER — Ambulatory Visit (INDEPENDENT_AMBULATORY_CARE_PROVIDER_SITE_OTHER): Payer: BLUE CROSS/BLUE SHIELD | Admitting: Internal Medicine

## 2014-08-17 VITALS — BP 126/80 | HR 94 | Temp 97.9°F | Resp 12 | Wt 316.6 lb

## 2014-08-17 DIAGNOSIS — E119 Type 2 diabetes mellitus without complications: Secondary | ICD-10-CM | POA: Diagnosis not present

## 2014-08-17 DIAGNOSIS — E1169 Type 2 diabetes mellitus with other specified complication: Secondary | ICD-10-CM

## 2014-08-17 DIAGNOSIS — E669 Obesity, unspecified: Secondary | ICD-10-CM | POA: Diagnosis not present

## 2014-08-17 NOTE — Progress Notes (Signed)
Subjective:     Patient ID: [name], female   DOB: June 22, 1960, 54 y.o.   MRN: [id number]  HPI  [name] is a 54 y.o.-year-old female, presenting for f/u for DM2, dx 11/2012 per her report, 2010 per chart review, non-insulin-dependent, uncontrolled, without complications. In the past, I also evaluated her for HTN/hyperaldosteronism/bilateral leg edema, however, she refused further investigation and this is now managed successfully by PCP. Last visit 3 months ago.  DM2: Last hemoglobin A1c was: Lab Results  Component Value Date   HGBA1C 7.6* 07/11/2014   HGBA1C 7.2* 03/22/2014   HGBA1C 7.5* 01/27/2013    Pt is not on meds for DM.   She was on Metformin XR 500 mg po daily >> not taking it b/c diarrhea  She is checking sugars 0-1x a day.  She is not checking at home, but checks at work. - 89-129 fasting >> n/c >> 120s - 2h after b'fast: 121-126 >> n/c - 2h after lunch: 130s >> n/c ? hypoglycemia awareness.  Highest sugar was 130's.   - no CKD, last BUN/creatinine:  Lab Results  Component Value Date   BUN 11 07/11/2014   CREATININE 0.82 07/11/2014   - last set of lipids: Lab Results  Component Value Date   CHOL 189 03/22/2014   HDL 48.20 03/22/2014   LDLCALC 116* 03/22/2014   TRIG 124.0 03/22/2014   CHOLHDL 4 03/22/2014   - last eye exam was last summer. No DR. She will go for a new one soon. - + numbness and tingling in her R toe - but had foot surgery in the past  HTN: - 150-164/80-90 >>   at work in last month. Today 126/80.  Review of Systems Constitutional: no weight gain/loss, + fatigue, no subjective hyperthermia/hypothermia, + nocturia, + poor sleep Eyes: no blurry vision, no xerophthalmia ENT: no sore throat, no nodules palpated in throat, no dysphagia/odynophagia, no hoarseness Cardiovascular: no CP/SOB/palpitations/+ leg swelling Respiratory: no cough/SOB Gastrointestinal: no N/V/D/C Musculoskeletal: no muscle/joint aches Skin: +  rash legs Neurological: no tremors/numbness/tingling/dizziness  I reviewed pt's medications, allergies, PMH, social hx, family hx, and changes were documented in the history of present illness. Otherwise, unchanged from my initial visit note.  Objective:   Physical Exam BP 126/80 mmHg  Pulse 94  Temp(Src) 97.9 F (36.6 C) (Oral)  Resp 12  Wt 316 lb 9.6 oz (143.609 kg)  SpO2 96% Body mass index is 49.57 kg/(m^2). Wt Readings from Last 3 Encounters:  08/17/14 316 lb 9.6 oz (143.609 kg)  07/11/14 318 lb (144.244 kg)  06/15/14 316 lb (143.337 kg)   Constitutional: obese, in NAD Eyes: PERRLA, EOMI, no exophthalmos ENT: moist mucous membranes, no thyromegaly, no cervical lymphadenopathy Cardiovascular: RRR, No MRG, + significant pitting bilateral leg edema Respiratory: CTA B Gastrointestinal: abdomen soft, NT, ND, BS+ Musculoskeletal: no deformities, strength intact in all 4 Skin: moist, warm, no rashes Neurological: no tremor with outstretched hands, DTR normal in all 4   Assessment:     1. DM2 - diet-controlled - no complications  Not addressed today: 2. HTN - Left renal artery ? proximal fibromuscular dysplasia per MRA 10/28/2014 - also, at that time, a probably simple cyst of 2 cm was seen in the upper pole of the right kidney >> pt had renal angiography in 07/2006 >> did not show FMD (Dr Samule Ohm): FINDINGS:  1. Abdominal aorta: Normal abdominal aorta with no atherosclerotic  plaque and no aneurysm.  2. Left renal arteries: There are two  left renal arteries of roughly  equal in size. Both are angiographically normal. The left kidney  appears normal in size.  3. Right renal artery: Single vessel. It is angiographically normal.  The right kidney appears normal in size.  IMPRESSION/RECOMMENDATIONS: Angiographically normal renal arteries.  There is no suggestion of fibromuscular dysplasia.  - hyperaldosteronism 2012 per checks by Dr. Everardo AllEllison - patient not on beta blockers,  ACE inhibitors, or ARBs at the time 01/15/2011: Potassium 2.9 Aldosterone 16 PRA 0.20 Aldosterone/PRA 80 Cortisol 6.5 at 9 AM  02/25/2011:  Potassium 3.4 Aldosterone 22 PRA 0.18 Aldosterone/PRA 122  02/11/2013: Potassium 3.5 Aldosterone 23 PRA 0.16 Aldosterone/PRA 144  3. Hyperaldosteronism - per tests 2012 and 2014 - see above - ? If primary or secondary >> saline loading test ordered as a confirmatory test for Primary hyperaldosteronism, but pt did not go through with it  - I discussed with her about getting the confirmatory test for primary hyperaldosteronism, but would like to wait for now and see if she can manage HTN with diet and exercise     Plan:     1. Patient with long-standing, recently more controlled diabetes, diet-controlled, with fairly good sugar control, however, last HbA1c higher, at 7.6%. She did not start Metformin XR yet as she had pbs with diarrhea in the past. I advised her to take this at a low dose: 500 mg with dinner, but make sure this is a low fat meal. Patient Instructions  Please start checking sugars 1-2x a day and write them down. Focus on premeal and bedtime sugars.  Please get help figuring out how your glucose meter works.  Please start Metformin XR 500 mg with dinner.  Please return in 3 months with your sugar log.   - I suggested to check sugars at home, too, not only at work >> she needs to figure out how to check sugars with her meter - advised for yearly eye exams >> need to get new one - reviewed recent HbA1c: higher: 7.6% - Return to clinic in 3 mo with sugar log

## 2014-08-17 NOTE — Patient Instructions (Signed)
Please start checking sugars 1-2x a day and write them down. Focus on premeal and bedtime sugars.  Please get help figuring out how your glucose meter works.  Please start Metformin XR 500 mg with dinner.  Please return in 3 months with your sugar log.

## 2014-10-17 ENCOUNTER — Ambulatory Visit (INDEPENDENT_AMBULATORY_CARE_PROVIDER_SITE_OTHER): Payer: BLUE CROSS/BLUE SHIELD | Admitting: Family Medicine

## 2014-10-17 ENCOUNTER — Encounter: Payer: Self-pay | Admitting: Family Medicine

## 2014-10-17 ENCOUNTER — Telehealth: Payer: Self-pay | Admitting: *Deleted

## 2014-10-17 VITALS — BP 158/84 | HR 89 | Temp 98.1°F | Ht 67.0 in | Wt 321.1 lb

## 2014-10-17 DIAGNOSIS — H109 Unspecified conjunctivitis: Secondary | ICD-10-CM | POA: Diagnosis not present

## 2014-10-17 DIAGNOSIS — I10 Essential (primary) hypertension: Secondary | ICD-10-CM | POA: Diagnosis not present

## 2014-10-17 MED ORDER — ERYTHROMYCIN 5 MG/GM OP OINT: 1.0000 "application " | TOPICAL_OINTMENT | Freq: Every day | OPHTHALMIC | Status: DC

## 2014-10-17 NOTE — Telephone Encounter (Signed)
Patient was seen today by Dr Selena Batten and requests a refill on Potassium chloride to be sent to [facility].

## 2014-10-17 NOTE — Telephone Encounter (Signed)
Pt seen Dr. Kirtland Bouchard in March with lab work.  When would you like the pt to return?

## 2014-10-17 NOTE — Progress Notes (Signed)
Pre visit review using our clinic review tool, if applicable. No additional management support is needed unless otherwise documented below in the visit note. 

## 2014-10-17 NOTE — Progress Notes (Signed)
HPI:  Acute visit for:  ? Pink Eye: -PCP unavailable -started about 1 week ago -has allergies so she thought it was this -symptoms: L eye itchy, clear drainage, some matting on eyelashes, irritated -denies: vision changes, pain, fevers, spreading to other eye -abx give her diarrhea - not really allergic to azithromycin -has an opthomologist  ROS: See pertinent positives and negatives per HPI.  Past Medical History  Diagnosis Date  . Hypertension     x many years (since birht of her son at 30) Had workup with renal artery angiogram that showed no evidence for fibromuscular dysplasia ( no significant renal artery stenosis.) ECHO (8/11) EF 60-65%, mild LVH, no regional WMAs.  . Morbid obesity   . Allergic rhinitis   . Diabetes mellitus   . Hyperlipidemia   . Low back pain     Past Surgical History  Procedure Laterality Date  . Abdominal hysterectomy      partial for fibroids  . Tubal ligation      Family History  Problem Relation Age of Onset  . Cancer Mother   . Hypertension Mother   . Other Father     died in MVA    History   Social History  . Marital Status: Married    Spouse Name: N/A  . Number of Children: N/A  . Years of Education: N/A   Social History Main Topics  . Smoking status: Never Smoker   . Smokeless tobacco: Never Used  . Alcohol Use: No  . Drug Use: No  . Sexual Activity: Not on file   Other Topics Concern  . None   Social History Narrative   Married   Regular exercise-yes   HH of 4    26 and 22    No pets   Works at [employer]           Current outpatient prescriptions:  .  beclomethasone (QVAR) 40 MCG/ACT inhaler, Inhale 2 puffs into the lungs 2 (two) times daily. Controller in season, Disp: 1 Inhaler, Rfl: 12 .  felodipine (PLENDIL) 5 MG 24 hr tablet, TAKE 2 TABLETS DAILY, Disp: 180 tablet, Rfl: 0 .  furosemide (LASIX) 40 MG tablet, Take 1 tablet (40 mg total) by mouth daily. (Patient taking differently: Take 40 mg by  mouth daily as needed. ), Disp: 30 tablet, Rfl: 1 .  glucose blood (ONETOUCH VERIO) test strip, Use to test blood sugar 1 time daily as instructed., Disp: 100 each, Rfl: 3 .  metFORMIN (GLUCOPHAGE XR) 500 MG 24 hr tablet, Take 2 tablets (1,000 mg total) by mouth daily with breakfast., Disp: 180 tablet, Rfl: 0 .  Multiple Vitamin (MULTIVITAMIN) tablet, Take 1 tablet by mouth daily.  , Disp: , Rfl:  .  potassium chloride SA (K-DUR,KLOR-CON) 20 MEQ tablet, Take 1 tablet (20 mEq total) by mouth daily., Disp: 7 tablet, Rfl: 0 .  erythromycin ophthalmic ointment, Place 1 application into the left eye at bedtime. For 5-7 days, Disp: 3.5 g, Rfl: 0 .  [DISCONTINUED] potassium chloride (KLOR-CON) 20 MEQ packet, Take 40 mEq by mouth daily., Disp: 60 tablet, Rfl: 2  EXAM:  Filed Vitals:   10/17/14 1342  BP: 158/84  Pulse: 89  Temp: 98.1 F (36.7 C)    Body mass index is 50.28 kg/(m^2).  GENERAL: vitals reviewed and listed above, alert, oriented, appears well hydrated and in no acute distress  HEENT: atraumatic, conjunttiva with some erythema on L, visual acuity grossly intact, EOMI, PERRLA, clear drainage, no  obvious abnormalities on inspection of external nose and ears  NECK: no obvious masses on inspection  LUNGS: clear to auscultation bilaterally, no wheezes, rales or rhonchi, good air movement  CV: HRRR, no peripheral edema  MS: moves all extremities without noticeable abnormality  PSYCH: pleasant and cooperative, no obvious depression or anxiety  ASSESSMENT AND PLAN:  Discussed the following assessment and plan:  Conjunctivitis of left eye - Plan: erythromycin ophthalmic ointment -eye oint to sooth and in case bacterial infection, compresses, follow up with optho if persists  Essential hypertension -advised elevated and advised change in treatment, she reports she an her PCP are working on this, declined changes today, advised follow up with PCP  -Patient advised to return or  notify a doctor immediately if symptoms worsen or persist or new concerns arise.  There are no Patient Instructions on file for this visit.   Kriste Basque R.

## 2014-10-17 NOTE — Patient Instructions (Signed)
Schedule follow up with Dr. Fabian Sharp regarding your blood pressure  Use the ointment nightly along with warm compresses and follow up with your eye doctor if eye is worsening or not imrpoving over 5-7 days  Bacterial Conjunctivitis Bacterial conjunctivitis, commonly called pink eye, is an inflammation of the clear membrane that covers the white part of the eye (conjunctiva). The inflammation can also happen on the underside of the eyelids. The blood vessels in the conjunctiva become inflamed, causing the eye to become red or pink. Bacterial conjunctivitis may spread easily from one eye to another and from person to person (contagious).  CAUSES  Bacterial conjunctivitis is caused by bacteria. The bacteria may come from your own skin, your upper respiratory tract, or from someone else with bacterial conjunctivitis. SYMPTOMS  The normally white color of the eye or the underside of the eyelid is usually pink or red. The pink eye is usually associated with irritation, tearing, and some sensitivity to light. Bacterial conjunctivitis is often associated with a thick, yellowish discharge from the eye. The discharge may turn into a crust on the eyelids overnight, which causes your eyelids to stick together. If a discharge is present, there may also be some blurred vision in the affected eye. DIAGNOSIS  Bacterial conjunctivitis is diagnosed by your caregiver through an eye exam and the symptoms that you report. Your caregiver looks for changes in the surface tissues of your eyes, which may point to the specific type of conjunctivitis. A sample of any discharge may be collected on a cotton-tip swab if you have a severe case of conjunctivitis, if your cornea is affected, or if you keep getting repeat infections that do not respond to treatment. The sample will be sent to a lab to see if the inflammation is caused by a bacterial infection and to see if the infection will respond to antibiotic medicines. TREATMENT    Bacterial conjunctivitis is treated with antibiotics. Antibiotic eyedrops are most often used. However, antibiotic ointments are also available. Antibiotics pills are sometimes used. Artificial tears or eye washes may ease discomfort. HOME CARE INSTRUCTIONS   To ease discomfort, apply a cool, clean washcloth to your eye for 10-20 minutes, 3-4 times a day.  Gently wipe away any drainage from your eye with a warm, wet washcloth or a cotton ball.  Wash your hands often with soap and water. Use paper towels to dry your hands.  Do not share towels or washcloths. This may spread the infection.  Change or wash your pillowcase every day.  You should not use eye makeup until the infection is gone.  Do not operate machinery or drive if your vision is blurred.  Stop using contact lenses. Ask your caregiver how to sterilize or replace your contacts before using them again. This depends on the type of contact lenses that you use.  When applying medicine to the infected eye, do not touch the edge of your eyelid with the eyedrop bottle or ointment tube. SEEK IMMEDIATE MEDICAL CARE IF:   Your infection has not improved within 3 days after beginning treatment.  You had yellow discharge from your eye and it returns.  You have increased eye pain.  Your eye redness is spreading.  Your vision becomes blurred.  You have a fever or persistent symptoms for more than 2-3 days.  You have a fever and your symptoms suddenly get worse.  You have facial pain, redness, or swelling. MAKE SURE YOU:   Understand these instructions.  Will watch your  condition.  Will get help right away if you are not doing well or get worse. Document Released: 04/22/2005 Document Revised: 09/06/2013 Document Reviewed: 09/23/2011 [facility] Patient Information 2015 [place], [place]. This information is not intended to replace advice given to you by your health care provider. Make sure you discuss any questions you have  with your health care provider.

## 2014-10-18 ENCOUNTER — Telehealth: Payer: Self-pay | Admitting: Internal Medicine

## 2014-10-18 ENCOUNTER — Other Ambulatory Visit: Payer: Self-pay | Admitting: Internal Medicine

## 2014-10-18 MED ORDER — POTASSIUM CHLORIDE CRYS ER 20 MEQ PO TBCR: 20.0000 meq | EXTENDED_RELEASE_TABLET | Freq: Every day | ORAL | Status: DC

## 2014-10-18 NOTE — Telephone Encounter (Signed)
The ointment is stronger then drops. If not improving in several days or if worsening advise she see the opthomologist.

## 2014-10-18 NOTE — Telephone Encounter (Signed)
Pt has appt with dr Reece Agar in July  That she should keep can refill potassium for 6 months  Wellness visit  Med check in novemeber or thereabouts

## 2014-10-18 NOTE — Telephone Encounter (Signed)
Sent to the pharmacy by e-scribe. 

## 2014-10-18 NOTE — Telephone Encounter (Signed)
Left a detailed message at the pts cell number with the information below. 

## 2014-10-18 NOTE — Telephone Encounter (Signed)
Pt saw dr Selena Batten yesterday for pink eye and was given rx for ointment for eye. Pt states she would like eye drops call into [facility]. The ointment is not working

## 2014-10-18 NOTE — Addendum Note (Signed)
Addended by: Raj Janus T on: 10/18/2014 02:50 PM   Modules accepted: Orders

## 2014-10-20 NOTE — Telephone Encounter (Signed)
Sent to the pharmacy by e-scribe. 

## 2014-10-20 NOTE — Telephone Encounter (Signed)
Refill x 6 months 

## 2014-11-17 ENCOUNTER — Ambulatory Visit: Payer: BLUE CROSS/BLUE SHIELD | Admitting: Internal Medicine

## 2014-12-06 ENCOUNTER — Telehealth: Payer: Self-pay | Admitting: Pulmonary Disease

## 2014-12-06 NOTE — Telephone Encounter (Signed)
Home Sleep Test order put in 06/15/2014. LVM for patient on 3 different times. No return phone call at all

## 2015-01-22 ENCOUNTER — Other Ambulatory Visit: Payer: Self-pay | Admitting: Internal Medicine

## 2015-01-23 NOTE — Telephone Encounter (Signed)
Does not look like this medication has been recently filled.  Please advise.  Thanks!

## 2015-01-27 NOTE — Telephone Encounter (Signed)
This is not on her med list currently   Her last K was low  contact patient and clarify what she is taking  She has  fu visit with dr Reece Agar in early October  Should be due ofr a1c and bmp

## 2015-01-30 NOTE — Telephone Encounter (Signed)
Attempted to contact patient, but was unable to reach. I left a voicemail requesting a call back regarding a medication refill. Thanks!

## 2015-02-01 NOTE — Telephone Encounter (Signed)
Left message on machine for patient to return our call 

## 2015-02-06 NOTE — Telephone Encounter (Signed)
Left a message on home and cell for a return call.  Multiple attempts have been made to reach the pt. Refill denied Left a message on cell to call back and give needed information if refill is needed.

## 2015-02-07 ENCOUNTER — Ambulatory Visit: Payer: BLUE CROSS/BLUE SHIELD | Admitting: Internal Medicine

## 2015-03-16 ENCOUNTER — Encounter: Payer: Self-pay | Admitting: Adult Health

## 2015-03-16 ENCOUNTER — Ambulatory Visit (INDEPENDENT_AMBULATORY_CARE_PROVIDER_SITE_OTHER): Payer: BLUE CROSS/BLUE SHIELD | Admitting: Adult Health

## 2015-03-16 VITALS — BP 144/90 | HR 86 | Temp 98.1°F | Ht 67.0 in | Wt 317.0 lb

## 2015-03-16 DIAGNOSIS — J208 Acute bronchitis due to other specified organisms: Secondary | ICD-10-CM

## 2015-03-16 MED ORDER — METHYLPREDNISOLONE 4 MG PO TBPK: ORAL_TABLET | ORAL | Status: DC

## 2015-03-16 NOTE — Progress Notes (Signed)
Pre visit review using our clinic review tool, if applicable. No additional management support is needed unless otherwise documented below in the visit note. 

## 2015-03-16 NOTE — Patient Instructions (Addendum)
It was great meeting you today!  Use Mucinex cough and take the prednisone as directed.   Follow up with Dr. Elvera LennoxGherghe as soon as possible.    Acute Bronchitis Bronchitis is inflammation of the airways that extend from the windpipe into the lungs (bronchi). The inflammation often causes mucus to develop. This leads to a cough, which is the most common symptom of bronchitis.  In acute bronchitis, the condition usually develops suddenly and goes away over time, usually in a couple weeks. Smoking, allergies, and asthma can make bronchitis worse. Repeated episodes of bronchitis may cause further lung problems.  CAUSES Acute bronchitis is most often caused by the same virus that causes a cold. The virus can spread from person to person (contagious) through coughing, sneezing, and touching contaminated objects. SIGNS AND SYMPTOMS   Cough.   Fever.   Coughing up mucus.   Body aches.   Chest congestion.   Chills.   Shortness of breath.   Sore throat.  DIAGNOSIS  Acute bronchitis is usually diagnosed through a physical exam. Your health care provider will also ask you questions about your medical history. Tests, such as chest X-rays, are sometimes done to rule out other conditions.  TREATMENT  Acute bronchitis usually goes away in a couple weeks. Oftentimes, no medical treatment is necessary. Medicines are sometimes given for relief of fever or cough. Antibiotic medicines are usually not needed but may be prescribed in certain situations. In some cases, an inhaler may be recommended to help reduce shortness of breath and control the cough. A cool mist vaporizer may also be used to help thin bronchial secretions and make it easier to clear the chest.  HOME CARE INSTRUCTIONS  Get plenty of rest.   Drink enough fluids to keep your urine clear or pale yellow (unless you have a medical condition that requires fluid restriction). Increasing fluids may help thin your respiratory  secretions (sputum) and reduce chest congestion, and it will prevent dehydration.   Take medicines only as directed by your health care provider.  If you were prescribed an antibiotic medicine, finish it all even if you start to feel better.  Avoid smoking and secondhand smoke. Exposure to cigarette smoke or irritating chemicals will make bronchitis worse. If you are a smoker, consider using nicotine gum or skin patches to help control withdrawal symptoms. Quitting smoking will help your lungs heal faster.   Reduce the chances of another bout of acute bronchitis by washing your hands frequently, avoiding people with cold symptoms, and trying not to touch your hands to your mouth, nose, or eyes.   Keep all follow-up visits as directed by your health care provider.  SEEK MEDICAL CARE IF: Your symptoms do not improve after 1 week of treatment.  SEEK IMMEDIATE MEDICAL CARE IF:  You develop an increased fever or chills.   You have chest pain.   You have severe shortness of breath.  You have bloody sputum.   You develop dehydration.  You faint or repeatedly feel like you are going to pass out.  You develop repeated vomiting.  You develop a severe headache. MAKE SURE YOU:   Understand these instructions.  Will watch your condition.  Will get help right away if you are not doing well or get worse.   This information is not intended to replace advice given to you by your health care provider. Make sure you discuss any questions you have with your health care provider.   Document Released: 05/30/2004 Document Revised:  05/13/2014 Document Reviewed: 10/13/2012 Elsevier Interactive Patient Education [employer].

## 2015-03-16 NOTE — Progress Notes (Signed)
   Subjective:    Patient ID: [name] BorosSharon D Park, female    DOB: May 19, 1960, 54 y.o.   MRN: [id number]  HPI  54 year old female who presents to the office today for SOB, dry cough, body aches and sinus pain and pressure. She endorses that this started in October. She gets bronchitis every fall.   She is using OTC allergy medication which helps for the sinus pain and pressure. She uses an inhaler which is not helping with the SOB  No fevers, no sick contacts  Review of Systems  Constitutional: Positive for fatigue. Negative for fever and chills.  HENT: Positive for sinus pressure. Negative for postnasal drip, rhinorrhea and sore throat.   Respiratory: Positive for cough, shortness of breath and wheezing.   Cardiovascular: Negative.   Musculoskeletal: Positive for myalgias.  Neurological: Negative.   Hematological: Negative.   All other systems reviewed and are negative.      Objective:   Physical Exam  Constitutional: She is oriented to person, place, and time. She appears well-developed and well-nourished. No distress.  HENT:  Head: Normocephalic and atraumatic.  Right Ear: External ear normal.  Left Ear: External ear normal.  Nose: Nose normal.  Mouth/Throat: Oropharynx is clear and moist. No oropharyngeal exudate.  Neck: Normal range of motion. Neck supple.  Cardiovascular: Normal rate, regular rhythm, normal heart sounds and intact distal pulses.  Exam reveals no gallop and no friction rub.   No murmur heard. Pulmonary/Chest: Effort normal and breath sounds normal. No respiratory distress. She has no wheezes. She has no rales. She exhibits no tenderness.  Decreased at bases. Increased expiratory phase  Lymphadenopathy:    She has no cervical adenopathy.  Neurological: She is alert and oriented to person, place, and time.  Skin: Skin is warm and dry. No rash noted. She is not diaphoretic. No erythema. No pallor.  Psychiatric: She has a normal mood and affect. Her behavior  is normal. Judgment and thought content normal.  Nursing note and vitals reviewed.     Assessment & Plan:  1. Acute bronchitis due to other specified organisms  - methylPREDNISolone (MEDROL DOSEPAK) 4 MG TBPK tablet; Take as directed  Dispense: 21 tablet; Refill: 0 - Mucinex during the day  Stressed the importance of following up with Dr. Elvera LennoxGherghe for her uncontrolled diabetes

## 2015-04-02 ENCOUNTER — Other Ambulatory Visit: Payer: Self-pay | Admitting: Internal Medicine

## 2015-04-05 ENCOUNTER — Telehealth: Payer: Self-pay | Admitting: Internal Medicine

## 2015-04-05 NOTE — Telephone Encounter (Signed)
Pt has an appt tomorrow with Dr Fabian SharpPanosh

## 2015-04-05 NOTE — Telephone Encounter (Signed)
[facility] - Client TELEPHONE ADVICE RECORD [facility]  Patient Name: [name]  DOB: 1960-12-03    Initial Comment Caller states is having flu like sx; congested; achy; has an inhaler; gasping for breath; cough; sore throat   Nurse Assessment  Nurse: Dorthula RuePatten, RN, Enrique SackKendra Date/Time (Eastern Time): 04/05/2015 11:55:05 AM  Confirm and document reason for call. If symptomatic, describe symptoms. ---Caller states she is coughing constantly. She feels like she is gasping for breath at times. She states her inhaler is not helping. She just finished Prednisone Tuesday before thanksgiving. She states she was around her supervisor who had the flu. Denies any current fever. She saw her nurse at work and she listened to her lungs and she states her lungs were clear  Has the patient traveled out of the country within the last 30 days? ---Not Applicable  Does the patient have any new or worsening symptoms? ---Yes  Will a triage be completed? ---Yes  Related visit to physician within the last 2 weeks? ---No  Does the PT have any chronic conditions? (i.e. diabetes, asthma, etc.) ---Yes  List chronic conditions. ---HTN, Diabetes  Did the patient indicate they were pregnant? ---No  Is this a behavioral health call? ---No     Guidelines    Guideline Title Affirmed Question Affirmed Notes  Influenza - Seasonal Cough present > 3 weeks    Final Disposition User   See PCP When Office is Open (within 3 days) Dorthula RuePatten, RN, Enrique SackKendra    Comments  There was a Data processing managerglitch in computer system and I had to call back line. Spoke with Koleen NimrodAdrian and she was able to schedule appointment tomorrow at 10am. [facility] verbalized understanding received.   Referrals  REFERRED TO PCP OFFICE   Disagree/Comply: Comply

## 2015-04-06 ENCOUNTER — Ambulatory Visit (INDEPENDENT_AMBULATORY_CARE_PROVIDER_SITE_OTHER): Payer: BLUE CROSS/BLUE SHIELD | Admitting: Internal Medicine

## 2015-04-06 ENCOUNTER — Encounter: Payer: Self-pay | Admitting: Internal Medicine

## 2015-04-06 VITALS — BP 160/70 | HR 94 | Temp 99.3°F | Wt 319.9 lb

## 2015-04-06 DIAGNOSIS — J069 Acute upper respiratory infection, unspecified: Secondary | ICD-10-CM

## 2015-04-06 DIAGNOSIS — I10 Essential (primary) hypertension: Secondary | ICD-10-CM | POA: Diagnosis not present

## 2015-04-06 DIAGNOSIS — J208 Acute bronchitis due to other specified organisms: Secondary | ICD-10-CM

## 2015-04-06 MED ORDER — DOXYCYCLINE HYCLATE 100 MG PO TABS: 100.0000 mg | ORAL_TABLET | Freq: Two times a day (BID) | ORAL | Status: DC

## 2015-04-06 MED ORDER — ALBUTEROL SULFATE HFA 108 (90 BASE) MCG/ACT IN AERS: 2.0000 | INHALATION_SPRAY | Freq: Four times a day (QID) | RESPIRATORY_TRACT | Status: DC | PRN

## 2015-04-06 MED ORDER — PREDNISONE 20 MG PO TABS: ORAL_TABLET | ORAL | Status: DC

## 2015-04-06 NOTE — Telephone Encounter (Signed)
Ok x 6 

## 2015-04-06 NOTE — Patient Instructions (Addendum)
Uncertain if this is relapsing illness or  New illness.  Cough meds may or may not work .   antibiotic   Cause of this . Because of possibvle bacterial infection .  Afrin  At night for x 3 to help  Not regular  use .      Cough, Adult Coughing is a reflex that clears your throat and your airways. Coughing helps to heal and protect your lungs. It is normal to cough occasionally, but a cough that happens with other symptoms or lasts a long time may be a sign of a condition that needs treatment. A cough may last only 2-3 weeks (acute), or it may last longer than 8 weeks (chronic). CAUSES Coughing is commonly caused by:  Breathing in substances that irritate your lungs.  A viral or bacterial respiratory infection.  Allergies.  Asthma.  Postnasal drip.  Smoking.  Acid backing up from the stomach into the esophagus (gastroesophageal reflux).  Certain medicines.  Chronic lung problems, including COPD (or rarely, lung cancer).  Other medical conditions such as heart failure. HOME CARE INSTRUCTIONS  Pay attention to any changes in your symptoms. Take these actions to help with your discomfort:  Take medicines only as told by your health care provider.  If you were prescribed an antibiotic medicine, take it as told by your health care provider. Do not stop taking the antibiotic even if you start to feel better.  Talk with your health care provider before you take a cough suppressant medicine.  Drink enough fluid to keep your urine clear or pale yellow.  If the air is dry, use a cold steam vaporizer or humidifier in your bedroom or your home to help loosen secretions.  Avoid anything that causes you to cough at work or at home.  If your cough is worse at night, try sleeping in a semi-upright position.  Avoid cigarette smoke. If you smoke, quit smoking. If you need help quitting, ask your health care provider.  Avoid caffeine.  Avoid alcohol.  Rest as needed. SEEK MEDICAL  CARE IF:   You have new symptoms.  You cough up pus.  Your cough does not get better after 2-3 weeks, or your cough gets worse.  You cannot control your cough with suppressant medicines and you are losing sleep.  You develop pain that is getting worse or pain that is not controlled with pain medicines.  You have a fever.  You have unexplained weight loss.  You have night sweats. SEEK IMMEDIATE MEDICAL CARE IF:  You cough up blood.  You have difficulty breathing.  Your heartbeat is very fast.   This information is not intended to replace advice given to you by your health care provider. Make sure you discuss any questions you have with your health care provider.   Document Released: 10/19/2010 Document Revised: 01/11/2015 Document Reviewed: 06/29/2014 Elsevier Interactive Patient Education 2016 Elsevier Inc.   IF NOT improving next week then contact us and may get chest x ray .

## 2015-04-06 NOTE — Progress Notes (Signed)
Pre visit review using our clinic review tool, if applicable. No additional management support is needed unless otherwise documented below in the visit note.  Chief Complaint  Patient presents with  . Cough  . Nasal Congestion  . Fatigue  . Wheezing    HPI: Patient [name]  comes in today for SDA for   problem evaluation. Ongoing  cough seen by CN recnetly and seemed to get mostly  better    But then last days  Worse   Ur congestion  And cough wheeze  no fever Sent in for acute urgent appt . Per  [facility] . No fever  . oisnet like sinus  Helped on it  And then  Got worse again  And boss ahd illness.  Respiratory  Exposed recently   No hemoptysis   No face pain  But lost of pressure and hard to sleep .  Using inhalr ( not working much)   q var? Doesn't have albuterol inhaler  Still has osa eval on her list to still do ( high risk and see many notes in the past)  ROS: See pertinent positives and negatives per HPI. No fever hemoptysis  No ic leg edema  Diabetes followd  Dr Reece Agar waiting to  Get some readings for her . bp had been better?    Past Medical History  Diagnosis Date  . Hypertension     x many years (since birht of her son at 53) Had workup with renal artery angiogram that showed no evidence for fibromuscular dysplasia ( no significant renal artery stenosis.) ECHO (8/11) EF 60-65%, mild LVH, no regional WMAs.  . Morbid obesity (HCC)   . Allergic rhinitis   . Diabetes mellitus   . Hyperlipidemia   . Low back pain     Family History  Problem Relation Age of Onset  . Cancer Mother   . Hypertension Mother   . Other Father     died in MVA    Social History   Social History  . Marital Status: Married    Spouse Name: N/A  . Number of Children: N/A  . Years of Education: N/A   Social History Main Topics  . Smoking status: Never Smoker   . Smokeless tobacco: Never Used  . Alcohol Use: No  . Drug Use: No  . Sexual Activity: Not Asked   Other Topics  Concern  . None   Social History Narrative   Married   Regular exercise-yes   HH of 4    26 and 22    No pets   Works at [employer] Prescriptions Prior to Visit  Medication Sig Dispense Refill  . glucose blood (ONETOUCH VERIO) test strip Use to test blood sugar 1 time daily as instructed. 100 each 3  . methylPREDNISolone (MEDROL DOSEPAK) 4 MG TBPK tablet Take as directed 21 tablet 0  . Multiple Vitamin (MULTIVITAMIN) tablet Take 1 tablet by mouth daily.      . potassium chloride SA (K-DUR,KLOR-CON) 20 MEQ tablet TAKE 1 TABLET DAILY 90 tablet 1  . beclomethasone (QVAR) 40 MCG/ACT inhaler Inhale 2 puffs into the lungs 2 (two) times daily. Controller in season 1 Inhaler 12  . felodipine (PLENDIL) 5 MG 24 hr tablet TAKE 2 TABLETS DAILY 180 tablet 1  . potassium chloride SA (K-DUR,KLOR-CON) 20 MEQ tablet Take 1 tablet (20 mEq total) by mouth daily. 90 tablet 1  . furosemide (  LASIX) 40 MG tablet Take 1 tablet (40 mg total) by mouth daily. (Patient not taking: Reported on 03/16/2015) 30 tablet 1  . metFORMIN (GLUCOPHAGE XR) 500 MG 24 hr tablet Take 2 tablets (1,000 mg total) by mouth daily with breakfast. (Patient not taking: Reported on 03/16/2015) 180 tablet 0   No facility-administered medications prior to visit.     EXAM:  BP 160/70 mmHg  Pulse 94  Temp(Src) 99.3 F (37.4 C) (Oral)  Wt 319 lb 14.4 oz (145.106 kg)  SpO2 95%  Body mass index is 50.09 kg/(m^2).  GENERAL: vitals reviewed and listed above, alert, oriented, appears well hydrated and in no acute distressver congested and cough but unlbored breathing   HEENT: atraumatic, conjunctiva  clear, no obvious abnormalities on inspection of external nose and earsface min tender  Mucoid drainage  OP : no lesion edema or exudate  NECK: no obvious masses on inspection palpation  LUNGS:   ?prolongeed exp phase  ocass muscial sound no rales  CV: HRRR, no clubbing cyanosis trc baseline   peripheral edema  nl cap refill  MS: moves all extremities without noticeable focal  abnormality PSYCH: pleasant and cooperative, no obvious depression or anxiety   ASSESSMENT AND PLAN:  Discussed the following assessment and plan:  Protracted URI  Acute bronchitis due to other specified organisms Uncertain if relapsing illness vs   New illness  Had improvement with oral steroids but agree that  Can wait on adding this  Add albuterol as needed and emprici antibiotic because of the 3 week course of illness and  Infected looking mucous.  Risk benefit of medication discussed.  Caution with decongestants  See instruc bp up today check off meds  Can add steroid if needed 3-5 days for wheeze but if  persistent or progressive advise c xray .  Etc -Patient advised to return or notify health care team  if symptoms worsen ,persist or new concerns arise.  Patient Instructions  Uncertain if this is relapsing illness or  New illness.  Cough meds may or may not work .   antibiotic   Cause of this . Because of possibvle bacterial infection .  Afrin  At night for x 3 to help  Not regular  use .      Cough, Adult Coughing is a reflex that clears your throat and your airways. Coughing helps to heal and protect your lungs. It is normal to cough occasionally, but a cough that happens with other symptoms or lasts a long time may be a sign of a condition that needs treatment. A cough may last only 2-3 weeks (acute), or it may last longer than 8 weeks (chronic). CAUSES Coughing is commonly caused by:  Breathing in substances that irritate your lungs.  A viral or bacterial respiratory infection.  Allergies.  Asthma.  Postnasal drip.  Smoking.  Acid backing up from the stomach into the esophagus (gastroesophageal reflux).  Certain medicines.  Chronic lung problems, including COPD (or rarely, lung cancer).  Other medical conditions such as heart failure. HOME CARE INSTRUCTIONS  Pay attention to any changes  in your symptoms. Take these actions to help with your discomfort:  Take medicines only as told by your health care provider.  If you were prescribed an antibiotic medicine, take it as told by your health care provider. Do not stop taking the antibiotic even if you start to feel better.  Talk with your health care provider before you take a cough suppressant medicine.  Drink  enough fluid to keep your urine clear or pale yellow.  If the air is dry, use a cold steam vaporizer or humidifier in your bedroom or your home to help loosen secretions.  Avoid anything that causes you to cough at work or at home.  If your cough is worse at night, try sleeping in a semi-upright position.  Avoid cigarette smoke. If you smoke, quit smoking. If you need help quitting, ask your health care provider.  Avoid caffeine.  Avoid alcohol.  Rest as needed. SEEK MEDICAL CARE IF:   You have new symptoms.  You cough up pus.  Your cough does not get better after 2-3 weeks, or your cough gets worse.  You cannot control your cough with suppressant medicines and you are losing sleep.  You develop pain that is getting worse or pain that is not controlled with pain medicines.  You have a fever.  You have unexplained weight loss.  You have night sweats. SEEK IMMEDIATE MEDICAL CARE IF:  You cough up blood.  You have difficulty breathing.  Your heartbeat is very fast.   This information is not intended to replace advice given to you by your health care provider. Make sure you discuss any questions you have with your health care provider.   Document Released: 10/19/2010 Document Revised: 01/11/2015 Document Reviewed: 06/29/2014 Elsevier Interactive Patient Education 2016 Elsevier Inc.   IF NOT improving next week then contact us and may get chest x ray .      Neta Mends. Panosh M.D.

## 2015-04-06 NOTE — Telephone Encounter (Signed)
Ok x 6 months 

## 2015-04-21 ENCOUNTER — Telehealth: Payer: Self-pay | Admitting: Internal Medicine

## 2015-04-21 ENCOUNTER — Encounter: Payer: Self-pay | Admitting: Family Medicine

## 2015-04-21 ENCOUNTER — Ambulatory Visit (INDEPENDENT_AMBULATORY_CARE_PROVIDER_SITE_OTHER): Payer: BLUE CROSS/BLUE SHIELD | Admitting: Family Medicine

## 2015-04-21 VITALS — BP 150/75 | HR 94 | Temp 98.7°F | Ht 67.0 in | Wt 319.0 lb

## 2015-04-21 DIAGNOSIS — J208 Acute bronchitis due to other specified organisms: Secondary | ICD-10-CM | POA: Diagnosis not present

## 2015-04-21 MED ORDER — METHYLPREDNISOLONE 4 MG PO TBPK: ORAL_TABLET | ORAL | Status: DC

## 2015-04-21 MED ORDER — LEVOFLOXACIN 500 MG PO TABS: 500.0000 mg | ORAL_TABLET | Freq: Every day | ORAL | Status: AC

## 2015-04-21 NOTE — Telephone Encounter (Signed)
[name] called saying she's still having Bronchitis symptoms. She finished taking the anti-biotics that were prescribed to her and she feels worse now. She still has a cough that's keeping her up at night and throughout the day and she still experiences shortness of breath despite using an inhaler. She said Dr. Fabian SharpPanosh told her if she didn't feel better within three days of her last appt to begin taking the Prednisone again. She's wondering if she can do that. I transferred her to the triage nurse also due to her saying she can't catch her breath. Please give her a call regarding this.  Pt's ph# [phone] Thank you.

## 2015-04-21 NOTE — Telephone Encounter (Signed)
FYI! Pt coming to see Dr. Fry today. 

## 2015-04-21 NOTE — Progress Notes (Signed)
Pre visit review using our clinic review tool, if applicable. No additional management support is needed unless otherwise documented below in the visit note. 

## 2015-04-21 NOTE — Telephone Encounter (Signed)
Patient Name: [name] DOB: 1961-02-07 Initial Comment Caller states got bronchitis 2 weeks before thanksgiving, was given Prednisone, hasn't been able to shake illness, was put on ABX and still not getting better. Having SOB and coughing. Nurse Assessment Nurse: Yetta BarreJones, RN, Miranda Date/Time (Eastern Time): 04/21/2015 9:07:02 AM Confirm and document reason for call. If symptomatic, describe symptoms. ---Caller states she was treated for bronchitis 1 month ago and treated with steroids. Symptoms were improving but then worsened again. 10 days ago she was seen by MD and prescribed antibiotics but is not getting better. She is cough constantly and having SOB. Albuterol Qday (last dose yesterday). Has the patient traveled out of the country within the last 30 days? ---No Does the patient have any new or worsening symptoms? ---Yes Will a triage be completed? ---Yes Related visit to physician within the last 2 weeks? ---Yes Does the PT have any chronic conditions? (i.e. diabetes, asthma, etc.) ---Yes List chronic conditions. ---Diabetes Did the patient indicate they were pregnant? ---No Is this a behavioral health or substance abuse call? ---No Guidelines Guideline Title Affirmed Question Affirmed Notes Infection on Antibiotic Follow-up Call [1] Taking antibiotic > 72 hours (3 days) AND [2] symptoms (other than fever) not improved Final Disposition User Call PCP within 24 Hours Jones, RN, Miranda Comments PT has only been using inhaler once a day. Told to go ahead and take a dose now and repeat in 20 min if not improved. Also told caller to start taking Prednisone as prescribed by MD since symptoms are not improved. Made appt for pt to be seen today at 2:15pm with Dr. Clent RidgesFry. Did tell caller if symptoms not improved after 2 treatments 20 min apart this morning, to go to the ED. Referrals REFERRED TO PCP OFFICE Disagree/Comply: Comply

## 2015-04-21 NOTE — Telephone Encounter (Signed)
Noted.  Will wait on [facility] note. 

## 2015-04-21 NOTE — Progress Notes (Signed)
   Subjective:    Patient ID: Fara BorosSharon D Pupo, female    DOB: 28-Jul-1960, 54 y.o.   MRN: [id number]  HPI Here for 2 weeks of chest tightness, wheezing, SOB, and a dry cough. No fever. She was here a week ago and was given a Medrol dose pack and some Doxycycline. She felt better for a few days but now feels worse again. She is using an albuterol inhaler prn.    Review of Systems  Constitutional: Negative.   HENT: Negative.   Eyes: Negative.   Respiratory: Positive for cough, chest tightness, shortness of breath and wheezing.   Cardiovascular: Negative.        Objective:   Physical Exam  Constitutional: She appears well-developed and well-nourished.  HENT:  Right Ear: External ear normal.  Left Ear: External ear normal.  Nose: Nose normal.  Mouth/Throat: Oropharynx is clear and moist.  Eyes: Conjunctivae are normal.  Cardiovascular: Normal rate, regular rhythm, normal heart sounds and intact distal pulses.   Pulmonary/Chest: Effort normal and breath sounds normal. No respiratory distress. She has no wheezes. She has no rales.  Lymphadenopathy:    She has no cervical adenopathy.          Assessment & Plan:  Bronchitis with bronchospasm. Treat with Levaquin and another steroid dose pack.

## 2015-07-23 ENCOUNTER — Emergency Department (HOSPITAL_COMMUNITY)
Admission: EM | Admit: 2015-07-23 | Discharge: 2015-07-24 | Disposition: A | Discharge status: Home / Self Care | Payer: BLUE CROSS/BLUE SHIELD | Attending: Emergency Medicine | Admitting: Emergency Medicine

## 2015-07-23 DIAGNOSIS — S39012A Strain of muscle, fascia and tendon of lower back, initial encounter: Secondary | ICD-10-CM | POA: Insufficient documentation

## 2015-07-23 DIAGNOSIS — I10 Essential (primary) hypertension: Secondary | ICD-10-CM | POA: Diagnosis not present

## 2015-07-23 DIAGNOSIS — Y998 Other external cause status: Secondary | ICD-10-CM | POA: Diagnosis not present

## 2015-07-23 DIAGNOSIS — Z79899 Other long term (current) drug therapy: Secondary | ICD-10-CM | POA: Insufficient documentation

## 2015-07-23 DIAGNOSIS — Y92410 Unspecified street and highway as the place of occurrence of the external cause: Secondary | ICD-10-CM | POA: Diagnosis not present

## 2015-07-23 DIAGNOSIS — S161XXA Strain of muscle, fascia and tendon at neck level, initial encounter: Secondary | ICD-10-CM | POA: Diagnosis not present

## 2015-07-23 DIAGNOSIS — E119 Type 2 diabetes mellitus without complications: Secondary | ICD-10-CM | POA: Insufficient documentation

## 2015-07-23 DIAGNOSIS — S3992XA Unspecified injury of lower back, initial encounter: Secondary | ICD-10-CM | POA: Diagnosis present

## 2015-07-23 DIAGNOSIS — Y9389 Activity, other specified: Secondary | ICD-10-CM | POA: Diagnosis not present

## 2015-07-23 DIAGNOSIS — S4990XA Unspecified injury of shoulder and upper arm, unspecified arm, initial encounter: Secondary | ICD-10-CM | POA: Insufficient documentation

## 2015-07-24 ENCOUNTER — Encounter (HOSPITAL_COMMUNITY): Payer: Self-pay | Admitting: *Deleted

## 2015-07-24 ENCOUNTER — Emergency Department (HOSPITAL_COMMUNITY): Payer: BLUE CROSS/BLUE SHIELD

## 2015-07-24 DIAGNOSIS — S39012A Strain of muscle, fascia and tendon of lower back, initial encounter: Secondary | ICD-10-CM | POA: Diagnosis not present

## 2015-07-24 MED ORDER — ACETAMINOPHEN 325 MG PO TABS
650.0000 mg | ORAL_TABLET | Freq: Once | ORAL | Status: AC
  Administered 2015-07-24: 650 mg via ORAL
  Filled 2015-07-24: qty 2

## 2015-07-24 MED ORDER — METHOCARBAMOL 500 MG PO TABS
500.0000 mg | ORAL_TABLET | Freq: Once | ORAL | Status: AC
  Administered 2015-07-24: 500 mg via ORAL
  Filled 2015-07-24: qty 1

## 2015-07-24 MED ORDER — ACETAMINOPHEN 500 MG PO TABS: 500.0000 mg | ORAL_TABLET | Freq: Four times a day (QID) | ORAL | Status: DC | PRN

## 2015-07-24 MED ORDER — METHOCARBAMOL 500 MG PO TABS: 500.0000 mg | ORAL_TABLET | Freq: Two times a day (BID) | ORAL | Status: DC | PRN
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
## 2015-07-24 NOTE — ED Notes (Signed)
PA at bedside.

## 2015-07-24 NOTE — ED Notes (Signed)
Pt c/o nausea when returning back to [facility].

## 2015-07-24 NOTE — ED Notes (Signed)
Patient seen ambulatory to restroom.  Patient currently sitting up in chair in assigned room.

## 2015-09-15 ENCOUNTER — Other Ambulatory Visit: Payer: Self-pay | Admitting: Sports Medicine

## 2015-09-15 DIAGNOSIS — M545 Low back pain: Secondary | ICD-10-CM

## 2015-09-15 DIAGNOSIS — IMO0001 Reserved for inherently not codable concepts without codable children: Secondary | ICD-10-CM

## 2015-09-23 ENCOUNTER — Other Ambulatory Visit: Payer: BLUE CROSS/BLUE SHIELD

## 2015-10-01 ENCOUNTER — Other Ambulatory Visit: Payer: Self-pay | Admitting: Internal Medicine

## 2015-10-03 ENCOUNTER — Ambulatory Visit
Admission: RE | Admit: 2015-10-03 | Discharge: 2015-10-03 | Disposition: A | Discharge status: Home / Self Care | Payer: BLUE CROSS/BLUE SHIELD | Source: Ambulatory Visit | Attending: Sports Medicine | Admitting: Sports Medicine

## 2015-10-03 DIAGNOSIS — IMO0001 Reserved for inherently not codable concepts without codable children: Secondary | ICD-10-CM

## 2015-10-03 DIAGNOSIS — M545 Low back pain: Secondary | ICD-10-CM

## 2015-10-05 NOTE — Telephone Encounter (Signed)
Pt is requesting for a [facility] day [facility], Pt is unsure of when she will be able to get in the office to be seen.   Pt states that she will try to call back to schedule the appt.

## 2015-10-05 NOTE — Telephone Encounter (Signed)
She has not followed up for her diabetes on the system and is way overdue for lab and follow up.  Can rx meds for 4 weeks and get her on my schedule before runs out.....   Can work in 30 minutes when  time good for her.  Need lab  Bmp lipid hg a1c and magnesium  urie microalbulin creatinine ratio   Lab Results  Component Value Date   WBC 6.2 03/22/2014   HGB 14.3 03/22/2014   HCT 44.0 03/22/2014   PLT 382.0 03/22/2014   GLUCOSE 157* 07/11/2014   CHOL 189 03/22/2014   TRIG 124.0 03/22/2014   HDL 48.20 03/22/2014   LDLCALC 116* 03/22/2014   ALT 18 03/22/2014   AST 18 03/22/2014   NA 137 07/11/2014   K 3.2* 07/11/2014   CL 99 07/11/2014   CREATININE 0.82 07/11/2014   BUN 11 07/11/2014   CO2 33* 07/11/2014   TSH 1.42 03/22/2014   INR 1.0 RATIO 07/08/2006   HGBA1C 7.6* 07/11/2014   MICROALBUR 8.2* 03/22/2014

## 2015-10-06 ENCOUNTER — Telehealth: Payer: Self-pay | Admitting: Internal Medicine

## 2015-10-06 ENCOUNTER — Other Ambulatory Visit: Payer: Self-pay | Admitting: *Deleted

## 2015-10-06 DIAGNOSIS — E119 Type 2 diabetes mellitus without complications: Secondary | ICD-10-CM

## 2015-10-06 DIAGNOSIS — E785 Hyperlipidemia, unspecified: Secondary | ICD-10-CM

## 2015-10-06 DIAGNOSIS — I10 Essential (primary) hypertension: Secondary | ICD-10-CM

## 2015-10-06 NOTE — Telephone Encounter (Signed)
She can get the labs at the [facility] office  she can go on any day they are open without appt .   So you can put in future orders there   And tell her to go  Any t time  Even at last minute.  Will refill this last  Time 90 days  but no  More  after that until she gets lab and OV .

## 2015-10-06 NOTE — Telephone Encounter (Signed)
Notified pt, added lab orders, sent rx to pharmacy.  Stressed to pt that no other medication will be sent without an OV & Labs.

## 2015-10-06 NOTE — Telephone Encounter (Signed)
Pt would like to have labs drawn at [facility]. Please put order in system

## 2015-10-06 NOTE — Telephone Encounter (Signed)
The orders were place this morning, I told the patient this, this morning when we talked.  I told her that she could go [facility] whenever she wanted, with no appt.

## 2015-10-12 ENCOUNTER — Other Ambulatory Visit (INDEPENDENT_AMBULATORY_CARE_PROVIDER_SITE_OTHER): Payer: BLUE CROSS/BLUE SHIELD

## 2015-10-12 DIAGNOSIS — E119 Type 2 diabetes mellitus without complications: Secondary | ICD-10-CM | POA: Diagnosis not present

## 2015-10-12 DIAGNOSIS — E785 Hyperlipidemia, unspecified: Secondary | ICD-10-CM

## 2015-10-12 DIAGNOSIS — I10 Essential (primary) hypertension: Secondary | ICD-10-CM

## 2015-10-12 LAB — BASIC METABOLIC PANEL WITH GFR
BUN: 10 mg/dL (ref 7–25)
CHLORIDE: 100 mmol/L (ref 98–110)
CO2: 30 mmol/L (ref 20–31)
CREATININE: 0.86 mg/dL (ref 0.50–1.05)
Calcium: 8.8 mg/dL (ref 8.6–10.4)
GFR, Est African American: >89 mL/min (ref >=60)
GFR, Est Non African American: 78 mL/min (ref >=60)
GLUCOSE: 134 mg/dL — AB (ref 65–99)
Potassium: 4 mmol/L (ref 3.5–5.3)
Sodium: 141 mmol/L (ref 135–146)

## 2015-10-12 LAB — HEMOGLOBIN A1C: Hgb A1c MFr Bld: 8 % — ABNORMAL HIGH (ref 4.6–6.5)

## 2015-10-12 LAB — LIPID PANEL
Cholesterol: 178 mg/dL (ref 0–200)
HDL: 44.3 mg/dL (ref >39.00)
LDL Cholesterol: 111 mg/dL — ABNORMAL HIGH (ref 0–99)
NonHDL: 133.96
TRIGLYCERIDES: 115 mg/dL (ref 0.0–149.0)
Total CHOL/HDL Ratio: 4
VLDL: 23 mg/dL (ref 0.0–40.0)

## 2015-10-12 LAB — MAGNESIUM: Magnesium: 1.8 mg/dL (ref 1.5–2.5)

## 2015-10-12 NOTE — Telephone Encounter (Signed)
Labs collected at 10:10 today.

## 2015-10-16 ENCOUNTER — Other Ambulatory Visit: Payer: BLUE CROSS/BLUE SHIELD

## 2015-10-16 DIAGNOSIS — R809 Proteinuria, unspecified: Secondary | ICD-10-CM | POA: Diagnosis not present

## 2015-10-16 LAB — MICROALBUMIN / CREATININE URINE RATIO
CREATININE, U: 176.1 mg/dL
MICROALB UR: 1.6 mg/dL (ref 0.0–1.9)
MICROALB/CREAT RATIO: 0.9 mg/g (ref 0.0–30.0)

## 2015-10-17 ENCOUNTER — Telehealth: Payer: Self-pay | Admitting: Internal Medicine

## 2015-10-17 DIAGNOSIS — R0602 Shortness of breath: Secondary | ICD-10-CM

## 2015-10-17 DIAGNOSIS — R0683 Snoring: Secondary | ICD-10-CM

## 2015-10-17 NOTE — Telephone Encounter (Signed)
Pt returning your call concerning lab results (UA too) Please call and Ok to leave detailed message on voice mail.  Pt also had to reschedule follow up to end of July.  Also would like another referral to sleep study, dr sood Pt never went when referral put in 2013.  Pt is having trouble sleeping

## 2015-10-18 ENCOUNTER — Telehealth: Payer: Self-pay | Admitting: Pulmonary Disease

## 2015-10-18 NOTE — Telephone Encounter (Signed)
Left a message for a return call.

## 2015-10-18 NOTE — Telephone Encounter (Signed)
lmtcb X1 for pt  

## 2015-10-19 NOTE — Telephone Encounter (Signed)
Send referral again    Poss OSA

## 2015-10-19 NOTE — Telephone Encounter (Signed)
lmtcb x2 for pt. 

## 2015-10-19 NOTE — Telephone Encounter (Signed)
Spoke to the pt about lab results.  Ok to do referral or wait until seen?

## 2015-10-20 NOTE — Telephone Encounter (Signed)
lmtcb x3 for pt. Message will be closed per triage protocol. 

## 2015-10-20 NOTE — Telephone Encounter (Signed)
Referral placed in the system. 

## 2015-10-23 ENCOUNTER — Ambulatory Visit: Payer: BLUE CROSS/BLUE SHIELD | Admitting: Internal Medicine

## 2015-11-21 ENCOUNTER — Encounter: Payer: Self-pay | Admitting: Adult Health

## 2015-11-21 ENCOUNTER — Ambulatory Visit (INDEPENDENT_AMBULATORY_CARE_PROVIDER_SITE_OTHER): Payer: BLUE CROSS/BLUE SHIELD | Admitting: Adult Health

## 2015-11-21 VITALS — BP 150/74 | HR 72 | Temp 97.7°F | Ht 67.0 in | Wt 324.0 lb

## 2015-11-21 DIAGNOSIS — G4730 Sleep apnea, unspecified: Secondary | ICD-10-CM

## 2015-11-21 NOTE — Patient Instructions (Signed)
Set up for Home sleep study .  Once results are back we will be in touch .  Work on weight loss.  Healthy sleep regimen .  follow up Dr. Craige CottaSood  In 3 months and As needed

## 2015-11-21 NOTE — Progress Notes (Addendum)
Subjective:    Patient ID: [name], female    DOB: 1960/10/25, 55 y.o.   MRN: [id number]  HPI 55 yo female seen for sleep consult in 06/2014 for snoring and daytime sleepiness.    11/21/2015 Follow up: Daytime sleepiness Patient returns for a sleep follow-up. She was seen in February 2016 for snoring, daytime sleepiness and multiple episodes of waking up throughout the night with difficulty catching her breath. Patient was recommended to have a home sleep study. However, patient did not have this set up. Patient says that her symptoms have persisted and she has pretty significant daytime sleepiness. Feels tired most of the day. She is willing to proceed with a home sleep study today.[name] Epworth sleepiness scale is 7.     Past Medical History  Diagnosis Date  . Hypertension     x many years (since birht of her son at 40) Had workup with renal artery angiogram that showed no evidence for fibromuscular dysplasia ( no significant renal artery stenosis.) ECHO (8/11) EF 60-65%, mild LVH, no regional WMAs.  . Morbid obesity (HCC)   . Allergic rhinitis   . Diabetes mellitus   . Hyperlipidemia   . Low back pain    Current Outpatient Prescriptions on File Prior to Visit  Medication Sig Dispense Refill  . albuterol (PROVENTIL HFA;VENTOLIN HFA) 108 (90 BASE) MCG/ACT inhaler Inhale 2 puffs into the lungs every 6 (six) hours as needed for wheezing or shortness of breath. 1 Inhaler 2  . felodipine (PLENDIL) 5 MG 24 hr tablet TAKE 2 TABLETS DAILY 180 tablet 0  . KLOR-CON M20 20 MEQ tablet TAKE 1 TABLET DAILY 90 tablet 0  . methocarbamol (ROBAXIN) 500 MG tablet Take 1 tablet (500 mg total) by mouth 2 (two) times daily as needed for muscle spasms. 20 tablet 0  . Multiple Vitamin (MULTIVITAMIN) tablet Take 1 tablet by mouth daily.      . Multiple Vitamins-Minerals (ZINC PO) Take 1 tablet by mouth daily.    [name] QVAR 40 MCG/ACT inhaler INHALE 2 PUFFS INTO THE LUNGS TWICE DAILY 8.7 g 6  .  acetaminophen (TYLENOL) 500 MG tablet Take 1 tablet (500 mg total) by mouth every 6 (six) hours as needed. (Patient not taking: Reported on 11/21/2015) 30 tablet 0  . Biotin (BIOTIN 5000) 5 MG CAPS Take 5 mg by mouth daily. Reported on 11/21/2015    . [DISCONTINUED] potassium chloride (KLOR-CON) 20 MEQ packet Take 40 mEq by mouth daily. 60 tablet 2   No current facility-administered medications on file prior to visit.     Review of Systems .Constitutional:   No  weight loss, night sweats,  Fevers, chills,  +fatigue, or  lassitude.  HEENT:   No headaches,  Difficulty swallowing,  Tooth/dental problems, or  Sore throat,                No sneezing, itching, ear ache, nasal congestion, post nasal drip,   CV:  No chest pain,  Orthopnea, PND, swelling in lower extremities, anasarca, dizziness, palpitations, syncope.   GI  No heartburn, indigestion, abdominal pain, nausea, vomiting, diarrhea, change in bowel habits, loss of appetite, bloody stools.   Resp: No shortness of breath with exertion or at rest.  No excess mucus, no productive cough,  No non-productive cough,  No coughing up of blood.  No change in color of mucus.  No wheezing.  No chest wall deformity  Skin: no rash or lesions.  GU: no dysuria, change in  color of urine, no urgency or frequency.  No flank pain, no hematuria   MS:  No joint pain or swelling.  No decreased range of motion.  No back pain.  Psych:  No change in mood or affect. No depression or anxiety.  No memory loss.         Objective:   Physical Exam  Filed Vitals:   11/21/15 1628  BP: 150/74  Pulse: 72  Temp: 97.7 F (36.5 C)  TempSrc: Oral  Height: 5\' 7"  (1.702 m)  Weight: 324 lb (146.965 kg)  SpO2: 97%  Body mass index is 50.73 kg/(m^2).   GEN: A/Ox3; pleasant , NAD, morbid obesity    HEENT:  [place]/AT,  EACs-clear, TMs-wnl, NOSE-clear, THROAT-clear, no lesions, no postnasal drip or exudate noted. Class 3 MP airway   NECK:  Supple w/ fair ROM; no  JVD; normal carotid impulses w/o bruits; no thyromegaly or nodules palpated; no lymphadenopathy.  RESP  Clear  P & A; w/o, wheezes/ rales/ or rhonchi.no accessory muscle use, no dullness to percussion  CARD:  RRR, no m/r/g  , no peripheral edema, pulses intact, no cyanosis or clubbing.  GI:   Soft & nt; nml bowel sounds; no organomegaly or masses detected.  Musco: Warm bil, no deformities or joint swelling noted.   Neuro: alert, no focal deficits noted.    Skin: Warm, no lesions or rashes   Shahan Starks NP-C  [facility]  11/21/2015       Assessment & Plan:

## 2015-11-21 NOTE — Addendum Note (Signed)
Addended by: Karalee HeightOX, Geo Slone P on: 11/21/2015 05:06 PM   Modules accepted: Orders

## 2015-11-21 NOTE — Assessment & Plan Note (Signed)
Suspected OSA with daytime sleepiness, snoring and morbid obesity  Plan  Set up for Home sleep study .  Once results are back we will be in touch .  Work on weight loss.  Healthy sleep regimen .  follow up Dr. Craige CottaSood  In 3 months and As neede

## 2015-11-22 NOTE — Progress Notes (Signed)
Reviewed and agree with assessment/plan.  Coralyn HellingVineet Challen Spainhour, MD [facility] 11/22/2015, 7:40 AM Pager:  [phone]

## 2015-11-27 ENCOUNTER — Telehealth: Payer: Self-pay | Admitting: Pulmonary Disease

## 2015-11-27 NOTE — Telephone Encounter (Signed)
LVM for pt to return call

## 2015-11-27 NOTE — Telephone Encounter (Signed)
Pt calling back [phone]

## 2015-11-27 NOTE — Telephone Encounter (Signed)
LMTC x 1  

## 2015-11-28 NOTE — Telephone Encounter (Signed)
LMTCB for pt 

## 2015-11-29 ENCOUNTER — Ambulatory Visit: Payer: BLUE CROSS/BLUE SHIELD | Admitting: Internal Medicine

## 2015-12-01 NOTE — Telephone Encounter (Signed)
lmtcb for pt.  

## 2015-12-04 NOTE — Telephone Encounter (Signed)
lmtcb for pt.  

## 2015-12-05 NOTE — Telephone Encounter (Signed)
lmtcb and will close per triage protocol  

## 2015-12-06 DIAGNOSIS — G4733 Obstructive sleep apnea (adult) (pediatric): Secondary | ICD-10-CM | POA: Diagnosis not present

## 2015-12-13 ENCOUNTER — Telehealth: Payer: Self-pay | Admitting: Pulmonary Disease

## 2015-12-13 DIAGNOSIS — G4733 Obstructive sleep apnea (adult) (pediatric): Secondary | ICD-10-CM

## 2015-12-13 NOTE — Telephone Encounter (Signed)
Attempted to contact patient, left message for patient to return call.

## 2015-12-13 NOTE — Telephone Encounter (Signed)
Patient notified of results. Patient notified of CPAP Titration Study date and time. Patient stated that she would be out of town during that time and would like to reschedule.  Gave her the phone number at the Sleep Lab to reschedule appointment. Nothing further needed.

## 2015-12-13 NOTE — Telephone Encounter (Signed)
HST 12/06/15 >> AHI 43.6, SaO2 low 55%.   Will have my nurse inform pt that sleep study showed severe obstructive sleep apnea with very low oxygen levels.  She needs in lab titration study.  I have sent order to arrange for titration study, and will call pt with results.

## 2015-12-14 DIAGNOSIS — G4733 Obstructive sleep apnea (adult) (pediatric): Secondary | ICD-10-CM | POA: Diagnosis not present

## 2015-12-15 ENCOUNTER — Other Ambulatory Visit: Payer: Self-pay | Admitting: *Deleted

## 2015-12-15 DIAGNOSIS — G4730 Sleep apnea, unspecified: Secondary | ICD-10-CM

## 2015-12-24 ENCOUNTER — Ambulatory Visit (HOSPITAL_BASED_OUTPATIENT_CLINIC_OR_DEPARTMENT_OTHER): Payer: BLUE CROSS/BLUE SHIELD | Attending: Pulmonary Disease | Admitting: Pulmonary Disease

## 2015-12-24 VITALS — Ht 67.0 in | Wt 324.0 lb

## 2015-12-24 DIAGNOSIS — G4733 Obstructive sleep apnea (adult) (pediatric): Secondary | ICD-10-CM | POA: Diagnosis present

## 2015-12-27 ENCOUNTER — Ambulatory Visit: Payer: BLUE CROSS/BLUE SHIELD | Admitting: Internal Medicine

## 2015-12-27 ENCOUNTER — Telehealth: Payer: Self-pay | Admitting: Pulmonary Disease

## 2015-12-27 DIAGNOSIS — G4733 Obstructive sleep apnea (adult) (pediatric): Secondary | ICD-10-CM

## 2015-12-27 NOTE — Telephone Encounter (Signed)
Pt is calling to get cpap titration study results.  Pt is very anxious to get these results so she can be set up with cpap.  VS please advise. thanks

## 2015-12-28 ENCOUNTER — Encounter: Payer: Self-pay | Admitting: Pulmonary Disease

## 2015-12-28 DIAGNOSIS — G4733 Obstructive sleep apnea (adult) (pediatric): Secondary | ICD-10-CM | POA: Insufficient documentation

## 2015-12-28 HISTORY — DX: Obstructive sleep apnea (adult) (pediatric): G47.33

## 2015-12-28 NOTE — Telephone Encounter (Signed)
lmomtcb x 1 for the pt.  Need to find out which DME company she would like to use to set up her cpap.

## 2015-12-28 NOTE — Telephone Encounter (Signed)
CPAP titration 12/24/15 >> CPAP 15 cm H2O >> AHI 1.2, +R, +S. Centrals with higher pressures.   Please arrange for CPAP 15 cm H2O with heated humidity and mask of choice.  Please schedule ROV with me or NP in 3 months after CPAP set up.

## 2015-12-28 NOTE — Telephone Encounter (Signed)
VS please advise.  thanks 

## 2015-12-28 NOTE — Procedures (Signed)
    Patient Name: [name], [name] Study Date: 12/24/2015 Gender: Female D.O.B: August 30, 1960 Age (years): [age] Referring Provider: Coralyn HellingVineet Rhilee Currin MD, ABSM Height (inches): 67 Interpreting Physician: Coralyn HellingVineet Rhyen Mazariego MD, ABSM Weight (lbs): 324 RPSGT: Cherylann ParrDubili, Fred BMI: 51 MRN: [id number] Neck Size: 18.00  CLINICAL INFORMATION The patient is referred for a CPAP titration to treat sleep apnea.   Date of HST: 12/06/15, AHI 43.6, SaO2 low 55%.  SLEEP STUDY TECHNIQUE As per the AASM Manual for the Scoring of Sleep and Associated Events v2.3 (April 2016) with a hypopnea requiring 4% desaturations. The channels recorded and monitored were frontal, central and occipital EEG, electrooculogram (EOG), submentalis EMG (chin), nasal and oral airflow, thoracic and abdominal wall motion, anterior tibialis EMG, snore microphone, electrocardiogram, and pulse oximetry. Continuous positive airway pressure (CPAP) was initiated at the beginning of the study and titrated to treat sleep-disordered breathing.  MEDICATIONS Medications taken by the patient : reviewed in electronic medical record. Medications administered by patient during sleep study : No sleep medicine administered.  TECHNICIAN COMMENTS Comments added by technician: Patient tolerated the mask good  Comments added by scorer: N/A  RESPIRATORY PARAMETERS Optimal PAP Pressure (cm): 15 AHI at Optimal Pressure (/hr): 1.2 Overall Minimal O2 (%): 84.00 Supine % at Optimal Pressure (%): 100 Minimal O2 at Optimal Pressure (%): 85.0    SLEEP ARCHITECTURE The study was initiated at 10:50:58 PM and ended at 5:08:54 AM. Sleep onset time was 26.8 minutes and the sleep efficiency was 81.8%. The total sleep time was 309.1 minutes. The patient spent 13.14% of the night in stage N1 sleep, 65.19% in stage N2 sleep, 0.00% in stage N3 and 21.68% in REM.Stage REM latency was 75.0 minutes Wake after sleep onset was 42.0. Alpha intrusion was absent. Supine sleep was  66.68%.  CARDIAC DATA The 2 lead EKG demonstrated sinus rhythm. The mean heart rate was 59.90 beats per minute. Other EKG findings include: None.  LEG MOVEMENT DATA The total Periodic Limb Movements of Sleep (PLMS) were 0. The PLMS index was 0.00. A PLMS index of <15 is considered normal in adults.  IMPRESSIONS - She did well with CPAP 15 cm H2O.  Her AHI at this pressure setting was 1.2.   She was observed in REM and supine sleep at this pressure setting.  She developed central apneas at higher pressure setting.  DIAGNOSIS - Obstructive Sleep Apnea (327.23 [G47.33 ICD-10])  RECOMMENDATIONS - Trial of CPAP therapy on 15 cm H2O - She was fitted with a Large size Resmed Full Face Mask AirFit F20 mask.  [Electronically signed] 12/28/2015 09:07 AM  Coralyn HellingVineet Luis Nickles MD, ABSM Diplomate, American Board of Sleep Medicine   NPI: [id number]

## 2015-12-28 NOTE — Telephone Encounter (Signed)
Patient notified of results.  CPAP ordered.  Patient will call back to scheduled CPAP Follow up in 3 months after she has received the CPAP machine.  Patient is aware that it takes up to 2 weeks to set up machine and will call in 2 weeks if she has not heard anything from DME.  Nothing further needed.

## 2015-12-28 NOTE — Telephone Encounter (Signed)
[phone], pt cb

## 2015-12-28 NOTE — Telephone Encounter (Signed)
Attempted to contact patient, left message for patient to return call.

## 2015-12-28 NOTE — Telephone Encounter (Signed)
Has no idea has never used one

## 2016-01-04 ENCOUNTER — Other Ambulatory Visit: Payer: Self-pay | Admitting: Internal Medicine

## 2016-01-05 NOTE — Telephone Encounter (Signed)
Sent to the pharmacy by e-scribe. 

## 2016-01-18 ENCOUNTER — Ambulatory Visit: Payer: BLUE CROSS/BLUE SHIELD | Admitting: Internal Medicine

## 2016-01-24 ENCOUNTER — Telehealth: Payer: Self-pay | Admitting: Pulmonary Disease

## 2016-01-24 NOTE — Telephone Encounter (Signed)
Called spoke with pt. She states that her sleep study was not covered under the diagnosis code that was provided. She gave me the number 1-800-ask blue. I explained to her that I would call her insurance and return her call. She voiced understanding and had no further questions.   Called spoke with [name]. He states that he is unable to give me any information. When I gave him the OSA diagnosis he states that he is unable to verify any codes other then procedure codes.   Called spoke with pt. She states that she is getting billed for several dates in 12/2015. I informed her of the above information. She states she will call her insurance back and return our call. When pt calls back she will need to speak to [name] as I am unable to answer billing questions.  Will forward message to [employer]

## 2016-01-29 NOTE — Telephone Encounter (Signed)
Liz please advise if you have spoken with the pt.  Thanks  

## 2016-01-30 ENCOUNTER — Encounter (HOSPITAL_BASED_OUTPATIENT_CLINIC_OR_DEPARTMENT_OTHER): Payer: BLUE CROSS/BLUE SHIELD

## 2016-01-31 NOTE — Telephone Encounter (Signed)
Yes, I spoke with the patient and corrected the problem.  [name]

## 2016-02-29 ENCOUNTER — Ambulatory Visit: Payer: BLUE CROSS/BLUE SHIELD | Admitting: Pulmonary Disease

## 2016-04-04 ENCOUNTER — Other Ambulatory Visit: Payer: Self-pay | Admitting: Internal Medicine

## 2016-04-06 NOTE — Telephone Encounter (Signed)
last ov with me was a year ago .  Last chem June? 17    Make   30 min appt with me  Early January And then can refill medications   Until seen at that appt.

## 2016-04-10 NOTE — Telephone Encounter (Signed)
Sent to the pharmacy by e-scribe for 90 days.  Pt scheduled for 05/08/16 for cpx.

## 2016-04-10 NOTE — Telephone Encounter (Signed)
Pt scheduled and would like to have her Rx refilled and a call when they have been called in to [facility].

## 2016-04-25 ENCOUNTER — Telehealth: Payer: Self-pay | Admitting: Internal Medicine

## 2016-04-25 NOTE — Telephone Encounter (Signed)
error 

## 2016-05-08 ENCOUNTER — Encounter: Payer: BLUE CROSS/BLUE SHIELD | Admitting: Internal Medicine

## 2016-06-02 ENCOUNTER — Emergency Department (HOSPITAL_COMMUNITY)
Admission: EM | Admit: 2016-06-02 | Discharge: 2016-06-02 | Disposition: A | Discharge status: Home / Self Care | Payer: BLUE CROSS/BLUE SHIELD | Attending: Emergency Medicine | Admitting: Emergency Medicine

## 2016-06-02 ENCOUNTER — Encounter (HOSPITAL_COMMUNITY): Payer: Self-pay | Admitting: Emergency Medicine

## 2016-06-02 ENCOUNTER — Emergency Department (HOSPITAL_COMMUNITY): Payer: BLUE CROSS/BLUE SHIELD

## 2016-06-02 ENCOUNTER — Ambulatory Visit (HOSPITAL_COMMUNITY)
Admission: EM | Admit: 2016-06-02 | Discharge: 2016-06-02 | Disposition: A | Discharge status: Nursing Home (Includes ICF) | Payer: BLUE CROSS/BLUE SHIELD | Attending: Family Medicine | Admitting: Family Medicine

## 2016-06-02 DIAGNOSIS — E119 Type 2 diabetes mellitus without complications: Secondary | ICD-10-CM | POA: Diagnosis not present

## 2016-06-02 DIAGNOSIS — R05 Cough: Secondary | ICD-10-CM | POA: Insufficient documentation

## 2016-06-02 DIAGNOSIS — R600 Localized edema: Secondary | ICD-10-CM | POA: Diagnosis not present

## 2016-06-02 DIAGNOSIS — I10 Essential (primary) hypertension: Secondary | ICD-10-CM | POA: Insufficient documentation

## 2016-06-02 DIAGNOSIS — R059 Cough, unspecified: Secondary | ICD-10-CM

## 2016-06-02 DIAGNOSIS — R0602 Shortness of breath: Secondary | ICD-10-CM | POA: Diagnosis not present

## 2016-06-02 DIAGNOSIS — R609 Edema, unspecified: Secondary | ICD-10-CM

## 2016-06-02 DIAGNOSIS — Z79899 Other long term (current) drug therapy: Secondary | ICD-10-CM | POA: Insufficient documentation

## 2016-06-02 LAB — BASIC METABOLIC PANEL
Anion gap: 12 (ref 5–15)
BUN: 10 mg/dL (ref 6–20)
CO2: 29 mmol/L (ref 22–32)
Calcium: 9.3 mg/dL (ref 8.9–10.3)
Chloride: 98 mmol/L — ABNORMAL LOW (ref 101–111)
Creatinine, Ser: 0.94 mg/dL (ref 0.44–1.00)
GFR calc Af Amer: >60 mL/min (ref >60)
GLUCOSE: 226 mg/dL — AB (ref 65–99)
POTASSIUM: 3.6 mmol/L (ref 3.5–5.1)
Sodium: 139 mmol/L (ref 135–145)

## 2016-06-02 LAB — CBC
HEMATOCRIT: 40.8 % (ref 36.0–46.0)
Hemoglobin: 13.6 g/dL (ref 12.0–15.0)
MCH: 31.2 pg (ref 26.0–34.0)
MCHC: 33.3 g/dL (ref 30.0–36.0)
MCV: 93.6 fL (ref 78.0–100.0)
Platelets: 385 10*3/uL (ref 150–400)
RBC: 4.36 MIL/uL (ref 3.87–5.11)
RDW: 12.8 % (ref 11.5–15.5)
WBC: 7.1 10*3/uL (ref 4.0–10.5)

## 2016-06-02 LAB — URINALYSIS, ROUTINE W REFLEX MICROSCOPIC
Bilirubin Urine: NEGATIVE
Glucose, UA: NEGATIVE mg/dL
Hgb urine dipstick: NEGATIVE
Ketones, ur: NEGATIVE mg/dL
Leukocytes, UA: NEGATIVE
NITRITE: NEGATIVE
PROTEIN: NEGATIVE mg/dL
Specific Gravity, Urine: 1.017 (ref 1.005–1.030)
pH: 6 (ref 5.0–8.0)

## 2016-06-02 LAB — D-DIMER, QUANTITATIVE (NOT AT ARMC)

## 2016-06-02 LAB — TSH: TSH: 1.438 u[IU]/mL (ref 0.350–4.500)

## 2016-06-02 LAB — I-STAT TROPONIN, ED: Troponin i, poc: 0 ng/mL (ref 0.00–0.08)

## 2016-06-02 LAB — BRAIN NATRIURETIC PEPTIDE: B Natriuretic Peptide: 22.2 pg/mL (ref 0.0–100.0)

## 2016-06-02 NOTE — ED Triage Notes (Signed)
[facility] with a complaint of shortness of breath. The patient reported 2 rounds of antibiotics that have not improved the situation.

## 2016-06-02 NOTE — ED Provider Notes (Signed)
[facility]-EMERGENCY DEPT Provider Note   CSN: [id number] Arrival date & time: 06/02/16  1442     History   Chief Complaint Chief Complaint  Patient presents with  . Shortness of Breath    HPI [name] is a 56 y.o. female.  The history is provided by the patient and medical records. No language interpreter was used.  Shortness of Breath  This is a new problem. The average episode lasts 2 weeks. The problem occurs continuously.The current episode started more than 1 week ago. The problem has not changed since onset.Associated symptoms include cough and leg swelling. Pertinent negatives include no fever, no headaches, no rhinorrhea, no neck pain, no sputum production, no hemoptysis, no wheezing, no chest pain, no syncope, no vomiting, no abdominal pain and no rash. She has tried nothing for the symptoms. Associated medical issues do not include COPD, chronic lung disease, PE, CAD, heart failure, past MI or DVT.    Past Medical History:  Diagnosis Date  . Allergic rhinitis   . Diabetes mellitus   . Hyperlipidemia   . Hypertension    x many years (since birht of her son at 48) Had workup with renal artery angiogram that showed no evidence for fibromuscular dysplasia ( no significant renal artery stenosis.) ECHO (8/11) EF 60-65%, mild LVH, no regional WMAs.  . Low back pain   . Morbid obesity (HCC)   . OSA (obstructive sleep apnea) 12/28/2015    Patient Active Problem List   Diagnosis Date Noted  . OSA (obstructive sleep apnea) 12/28/2015  . Visit for preventive health examination 03/22/2014  . Essential hypertension, benign 03/22/2014  . Recurrent bronchospasm 03/22/2014  . Influenza vaccination declined 03/22/2014  . Diabetes mellitus without complication (HCC) 03/22/2014  . Lower extremity edema 02/27/2013  . Knee pain 02/27/2013  . Allergic conjunctivitis and rhinitis 06/22/2012  . Hyperaldosteronism (HCC) 02/25/2011  . Diabetes mellitus type 2 in obese (HCC)  01/15/2011  . FOOT PAIN, LEFT 11/17/2009  . FREQUENCY, URINARY 09/09/2008  . ADVERSE REACTION TO MEDICATION 12/02/2007  . HYPOKALEMIA 08/19/2007  . Sleep apnea 07/21/2007  . Nocturia 07/07/2007  . HYPERGLYCEMIA 07/07/2007  . HYPERLIPIDEMIA 12/29/2006  . Allergic rhinitis 12/29/2006  . LOW BACK PAIN 12/29/2006  . MORBID OBESITY 12/25/2006  . Essential hypertension 12/25/2006    Past Surgical History:  Procedure Laterality Date  . ABDOMINAL HYSTERECTOMY     partial for fibroids  . TUBAL LIGATION      OB History    No data available       Home Medications    Prior to Admission medications   Medication Sig Start Date End Date Taking? Authorizing Provider  albuterol (PROAIR HFA) 108 (90 Base) MCG/ACT inhaler Inhale 1-2 puffs into the lungs 2 (two) times daily as needed for wheezing or shortness of breath.   Yes Historical Provider, MD  Ascorbic Acid (VITAMIN C PO) Take 1 tablet by mouth at bedtime.   Yes Historical Provider, MD  felodipine (PLENDIL) 5 MG 24 hr tablet TAKE 2 TABLETS DAILY 04/10/16  Yes Madelin Headings, MD  KLOR-CON M20 20 MEQ tablet TAKE 1 TABLET DAILY 04/10/16  Yes Madelin Headings, MD  Multiple Vitamin (MULTIVITAMIN WITH MINERALS) TABS tablet Take 1 tablet by mouth at bedtime. One a Day 50 plus   Yes Historical Provider, MD    Family History Family History  Problem Relation Age of Onset  . Cancer Mother   . Hypertension Mother   . Other Father  died in MVA    Social History Social History  Substance Use Topics  . Smoking status: Never Smoker  . Smokeless tobacco: Never Used  . Alcohol use No     Allergies   Candesartan cilexetil; Codeine; Ibuprofen; Telmisartan; and Zithromax [azithromycin]   Review of Systems Review of Systems  Constitutional: Positive for fatigue. Negative for chills, diaphoresis and fever.  HENT: Positive for congestion. Negative for rhinorrhea.   Respiratory: Positive for cough and shortness of breath. Negative for  hemoptysis, sputum production, chest tightness, wheezing and stridor.   Cardiovascular: Positive for leg swelling. Negative for chest pain, palpitations and syncope.  Gastrointestinal: Negative for abdominal pain, diarrhea, nausea and vomiting.  Genitourinary: Negative for dysuria and flank pain.  Musculoskeletal: Negative for back pain, neck pain and neck stiffness.  Skin: Negative for rash and wound.  Neurological: Negative for light-headedness and headaches.  Psychiatric/Behavioral: Negative for confusion.  All other systems reviewed and are negative.    Physical Exam Updated Vital Signs BP 135/91   Pulse 73   Temp 97.7 F (36.5 C)   Resp 15   Ht 5\' 7"  (1.702 m)   Wt (!) 326 lb (147.9 kg)   SpO2 99%   BMI 51.06 kg/m   Physical Exam  Constitutional: She appears well-developed and well-nourished. No distress.  HENT:  Head: Normocephalic and atraumatic.  Mouth/Throat: Oropharynx is clear and moist. No oropharyngeal exudate.  Eyes: Conjunctivae and EOM are normal. Pupils are equal, round, and reactive to light.  Neck: Neck supple.  Cardiovascular: Normal rate, regular rhythm, normal heart sounds and intact distal pulses.   No murmur heard. Pulmonary/Chest: Effort normal and breath sounds normal. No stridor. No respiratory distress. She has no wheezes. She has no rales. She exhibits no tenderness.  Abdominal: Soft. There is no tenderness.  Musculoskeletal: She exhibits edema. She exhibits no tenderness.  Neurological: She is alert.  Skin: Skin is warm and dry. Capillary refill takes less than 2 seconds. No rash noted. She is not diaphoretic.  Psychiatric: She has a normal mood and affect.  Nursing note and vitals reviewed.    ED Treatments / Results  Labs (all labs ordered are listed, but only abnormal results are displayed) Labs Reviewed  BASIC METABOLIC PANEL - Abnormal; Notable for the following:       Result Value   Chloride 98 (*)    Glucose, Bld 226 (*)    All  other components within normal limits  URINALYSIS, ROUTINE W REFLEX MICROSCOPIC - Abnormal; Notable for the following:    APPearance HAZY (*)    All other components within normal limits  CBC  BRAIN NATRIURETIC PEPTIDE  D-DIMER, QUANTITATIVE (NOT AT [facility])  TSH  I-STAT TROPOININ, ED    EKG  EKG Interpretation  Date/Time:  Sunday June 02 2016 15:04:32 EST Ventricular Rate:  88 PR Interval:  172 QRS Duration: 88 QT Interval:  384 QTC Calculation: 464 R Axis:   113 Text Interpretation:  Normal sinus rhythm Right axis deviation Low voltage QRS Cannot rule out Anterior infarct , age undetermined Abnormal ECG When compared to prior, no significant changes were seen No STEMI Confirmed by Rush Landmark MD, CHRISTOPHER [phone]) on 06/03/2016 11:55:05 AM       Radiology Dg Chest 2 View  Result Date: 06/02/2016 CLINICAL DATA:  Shortness of breath and cough for 1 month EXAM: CHEST  2 VIEW COMPARISON:  11/17/2009 FINDINGS: The cardiac shadow is mildly prominent but stable. The lungs are well aerated bilaterally. No  focal infiltrate or sizable effusion is seen. No acute bony abnormality is noted. IMPRESSION: No active cardiopulmonary disease. Electronically Signed   By: Alcide CleverMark  Lukens M.D.   On: 06/02/2016 16:05    Procedures Procedures (including critical care time)  Medications Ordered in ED Medications - No data to display   Initial Impression / Assessment and Plan / ED Course  I have reviewed the triage vital signs and the nursing notes.  Pertinent labs & imaging results that were available during my care of the patient were reviewed by me and considered in my medical decision making (see chart for details).    [name] is a 56 y.o. female Who presents with continued cough for two months as well as new Generalized edema over last few weeks. Patient denies fevers or chills, nausea, vomiting, constipation, diarrhea, dysuria. She reports that she  Has had some frequency. She denies  any other symptoms on arrival.  On exam, patient has generalized edema. Chest nontender. Lungs clear. Abdomen nontender. No focal neurologic deficits.  Based on symptoms, patient had workup to  Look for infectious  Etiologies, kidney dysfunction, electrolyte problems, or other abnormalities.  TSH normal, D dimer negative doubt DVT as source of swellings, Troponin negative, BNP nonelevated doubt heart failure, no leukocytosis, urinalysis clear, And electrolytes were grossly unremarkable with no evidence of kidney dysfunction.  Chest x-ray shows no pneumonia.  Given reassuring workup, patient instructed to follow up with PCP for further management of weight gain and edema. No evidence of bacterial infection requiring antibiotics. Suspect viral infection causing continued cough.  Return precautions give in patient discharged in good condition.      Final Clinical Impressions(s) / ED Diagnoses   Final diagnoses:  Edema, unspecified type  Cough  SOB (shortness of breath)     Clinical Impression: 1. Edema, unspecified type   2. Cough   3. SOB (shortness of breath)     Disposition: Discharge  Condition: Good  I have discussed the results, Dx and Tx plan with the pt(& family if present). He/she/they expressed understanding and agree(s) with the plan. Discharge instructions discussed at great length. Strict return precautions discussed and pt &/or family have verbalized understanding of the instructions. No further questions at time of discharge.    Discharge Medication List as of 06/02/2016 11:00 PM      Follow Up: [name], MD [address] [place] [id number] [phone]     [facility] [address] [id number] [place] [id number] [phone]  If symptoms worsen     Heide Scaleshristopher J Tegeler, MD 06/03/16 2113

## 2016-06-02 NOTE — ED Provider Notes (Signed)
CSN: [id number]     Arrival date & time 06/02/16  1206 History   First MD Initiated Contact with Patient 06/02/16 1326     Chief Complaint  Patient presents with  . Shortness of Breath   (Consider location/radiation/quality/duration/timing/severity/associated sxs/prior Treatment) 56 year old female presents with shortness of breath and coughing for over 1 month. Started over 3 weeks ago with some coughing and chest congestion. She went to an Urgent Care Center (not [facility]) and was dx with bronchitis. Was placed on an antibiotic (?Amoxicillin) and Prednisone with no relief. Also had a chest x-ray that showed an ? enlarged heart. Returned to the same Urgent Care Center about 1 week later and was placed on a different antibiotic (uncertain of name) as well as Albuterol inhaler. She has seen no improvement in symptoms and the inhaler makes her symptoms worse. She has noticed increased lower extremity swelling over the past few weeks and now experiencing more shortness of breath over the past 3 days. She denies any chest pain, dizziness, headache but is fatigued. She is concerned over CHF with the history of HTN, Diabetes, sleep apnea and hyperlipidemia.       Past Medical History:  Diagnosis Date  . Allergic rhinitis   . Diabetes mellitus   . Hyperlipidemia   . Hypertension    x many years (since birht of her son at 57) Had workup with renal artery angiogram that showed no evidence for fibromuscular dysplasia ( no significant renal artery stenosis.) ECHO (8/11) EF 60-65%, mild LVH, no regional WMAs.  . Low back pain   . Morbid obesity (HCC)   . OSA (obstructive sleep apnea) 12/28/2015   Past Surgical History:  Procedure Laterality Date  . ABDOMINAL HYSTERECTOMY     partial for fibroids  . TUBAL LIGATION     Family History  Problem Relation Age of Onset  . Cancer Mother   . Hypertension Mother   . Other Father     died in MVA   Social History  Substance Use Topics  . Smoking  status: Never Smoker  . Smokeless tobacco: Never Used  . Alcohol use No   OB History    No data available     Review of Systems  Constitutional: Positive for fatigue and unexpected weight change. Negative for diaphoresis and fever.  HENT: Positive for congestion and sinus pressure. Negative for postnasal drip, sinus pain and sore throat.   Eyes: Negative for visual disturbance.  Respiratory: Positive for cough, chest tightness and shortness of breath. Negative for wheezing.   Cardiovascular: Positive for leg swelling. Negative for chest pain and palpitations.  Skin: Negative for color change, rash and wound.  Neurological: Negative for dizziness, weakness, numbness and headaches.    Allergies  Candesartan cilexetil; Codeine; Ibuprofen; Telmisartan; and Zithromax [azithromycin]  Home Medications   Prior to Admission medications   Medication Sig Start Date End Date Taking? Authorizing Provider  felodipine (PLENDIL) 5 MG 24 hr tablet TAKE 2 TABLETS DAILY 04/10/16  Yes Madelin Headings, MD  KLOR-CON M20 20 MEQ tablet TAKE 1 TABLET DAILY 04/10/16  Yes Madelin Headings, MD   Meds Ordered and Administered this Visit  Medications - No data to display  BP 152/80 (BP Location: Left Arm)   Pulse 89   Temp 98 F (36.7 C) (Oral)   Resp 18   SpO2 98%  No data found.   Physical Exam  Constitutional: She is oriented to person, place, and time. She appears  well-developed and well-nourished. She is cooperative. No distress.  HENT:  Head: Normocephalic and atraumatic.  Right Ear: Hearing, tympanic membrane, external ear and ear canal normal.  Left Ear: Hearing, tympanic membrane, external ear and ear canal normal.  Nose: Rhinorrhea present. Right sinus exhibits no maxillary sinus tenderness and no frontal sinus tenderness. Left sinus exhibits no maxillary sinus tenderness and no frontal sinus tenderness.  Mouth/Throat: Uvula is midline, oropharynx is clear and moist and mucous membranes are  normal.  Eyes: Conjunctivae and EOM are normal.  Neck: Normal range of motion. Neck supple.  Cardiovascular: Normal rate, regular rhythm, S1 normal, S2 normal and normal pulses.   Pulmonary/Chest: No accessory muscle usage. No respiratory distress. She has decreased breath sounds in the right upper field, the right lower field, the left upper field and the left lower field. She has no wheezes. She has no rhonchi. She has no rales.  Musculoskeletal:  Bilateral non-pitting 2+ edema. No neuro deficits noted. No redness noted.   Lymphadenopathy:    She has no cervical adenopathy.  Neurological: She is alert and oriented to person, place, and time.  Skin: Skin is warm, dry and intact. Capillary refill takes less than 2 seconds. She is not diaphoretic.  Psychiatric: She has a normal mood and affect. Her behavior is normal. Judgment and thought content normal.    Urgent Care Course     Procedures (including critical care time)  Labs Review Labs Reviewed - No data to display  Imaging Review No results found.   Visual Acuity Review  Right Eye Distance:   Left Eye Distance:   Bilateral Distance:    Right Eye Near:   Left Eye Near:    Bilateral Near:         MDM   1. SOB (shortness of breath)   2. Peripheral edema    Due to shortness of breath worsening and persistent peripheral edema, recommend further evaluation at the ER now. Patient understands and will have her husband take her to the ER today. Declines ambulance transfer. Patient is stable and able to ambulate to ER.    Sudie GrumblingAnn Berry Rajinder Mesick, NP 06/02/16 [phone]

## 2016-06-02 NOTE — Discharge Instructions (Signed)
Please schedule a follow-up with your primary care physician for further management of your coughing  And generalized swelling. We did not find evidence of bacterial infection today however, if symptoms continue to worsen, please return to the nearest emergency department.

## 2016-06-02 NOTE — ED Notes (Signed)
Called to registration.  [facility]ch[facility].  Says she has been treated with 2 different antibiotics and prednisone.  Patient concerned for her breathing,  She continues to feel sob,  Patient speaking in complete sentences and provides extensive detail to questions asked.  Radial pulse is regular.  Breath sounds decreased but clear.  Skin warm and dry.  Patient has searched internet and concerned for chf.  Patient has chronic issues with swelling, but no new issues or additional swelling.

## 2016-06-02 NOTE — ED Notes (Signed)
Pt placed on 2L for O2 Sats in the low 90s

## 2016-06-02 NOTE — Discharge Instructions (Signed)
Due to symptoms worsening, recommend go to ER now for further evaluation.

## 2016-06-02 NOTE — ED Triage Notes (Addendum)
Pt reports sob, wheezing and dry cough x3 weeks. Reports she was treated for bronchitis with steroids and abx with no relief, last dose was last Thursday. Seen at urgent care today for follow up and sent over for further eval due to concern for fluid in her lungs, pt also reports weight gain and swelling in legs and feet. Pt a/ox, resp e/u, skin warm and dry.

## 2016-06-04 NOTE — Progress Notes (Signed)
Pre visit review using our clinic review tool, if applicable. No additional management support is needed unless otherwise documented below in the visit note.  Chief Complaint  Patient presents with  . Follow-up    Cough and SOB.    HPI: [name] 56 y.o.  Fu ed  For ongoing shortness of breath and leg edema. See emergency room note and previous urgent care. Felt like something settling in her chest about Mid end December  Felt like bronchitis  .  Went to urgent care and given antibiotic and " laughed at it" and another antibiotic  and caused dizziness and then  Got very bad . Then [place] No fever.   Has hx  Of Seasonal allergy .   Dry cough  Minimal .   Had pred at some point . ? No help  Pro-air inhaler makes her symptoms worse. 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 She describes some of the difficulty on the inhalation phase wonders if she could have asthma. She had originally gotten his CPAP machine but felt that she was smothering and the mask wasn't right so she stopped using it and the insurance company to get away. So she is willing to talk to someone about that currently untreated sleep apnea.  Didn't eat today .    Worried about heart failure.    No pnd  Some acid reflux at times    ROS: See pertinent positives and negatives per HPI.  Past Medical History:  Diagnosis Date  . Allergic rhinitis   . Diabetes mellitus   . Hyperlipidemia   . Hypertension    x many years (since birht of her son at 929) Had workup with renal artery angiogram that showed no evidence for fibromuscular dysplasia ( no significant renal artery stenosis.) ECHO (8/11) EF 60-65%, mild LVH, no regional WMAs.  . Low back pain   . Morbid obesity (HCC)   . OSA (obstructive sleep apnea) 12/28/2015    Family History  Problem Relation Age of Onset  . Cancer Mother   . Hypertension Mother   . Other Father     died in MVA    Social  History   Social History  . Marital status: Married    Spouse name: N/A  . Number of children: N/A  . Years of education: N/A   Social History Main Topics  . Smoking status: Never Smoker  . Smokeless tobacco: Never Used  . Alcohol use No  . Drug use: No  . Sexual activity: Not Asked   Other Topics Concern  . None   Social History Narrative   Married   Regular exercise-yes   HH of 4    28 and 22    No pets   Works at [employer]          Outpatient Medications Prior to Visit  Medication Sig Dispense Refill  . albuterol (PROAIR HFA) 108 (90 Base) MCG/ACT inhaler Inhale 1-2 puffs into the lungs 2 (two) times daily as needed for wheezing or shortness of breath.    . Ascorbic Acid (VITAMIN C PO) Take 1 tablet by mouth at bedtime.    . felodipine (PLENDIL) 5 MG 24 hr tablet TAKE 2 TABLETS DAILY 180 tablet 0  . KLOR-CON M20 20 MEQ tablet TAKE 1 TABLET DAILY 90 tablet 0  . Multiple Vitamin (MULTIVITAMIN WITH MINERALS) TABS tablet Take 1 tablet by  mouth at bedtime. One a Day 50 plus     No facility-administered medications prior to visit.      EXAM:  BP (!) 154/82 (BP Location: Right Arm, Patient Position: Sitting, Cuff Size: Large)   Pulse 92   Temp 98.6 F (37 C) (Oral)   Wt (!) 329 lb (149.2 kg)   SpO2 98%   BMI 51.53 kg/m   Body mass index is 51.53 kg/m.  GENERAL: vitals reviewed and listed above, alert, oriented, appears well hydrated and in no acute distressShe is hoarse with no stridor however when she coughs she does have some inspiratory sounds and shattering. She looks somewhat allergic. HEENT: atraumatic, conjunctiva  clear, no obvious abnormalities on inspection of external nose and ears OP : no lesion edema or exudate  NECK: no obvious masses on inspection palpation  LUNGS: clear to auscultation bilaterally, no wheezes, rales or rhonchi, question rare wheeze nebulizer with albuterol increased breath sounds no wheezing no laryngeal spasm. t CV: HRRR,  no clubbing cyanosis has 2+ peripheral edema nl cap refill  MS: moves all extremities without noticeable focal  abnormality PSYCH: pleasant and cooperative, no obvious depression or anxiety Lab Results  Component Value Date   WBC 7.1 06/02/2016   HGB 13.6 06/02/2016   HCT 40.8 06/02/2016   PLT 385 06/02/2016   GLUCOSE 226 (H) 06/02/2016   CHOL 178 10/12/2015   TRIG 115.0 10/12/2015   HDL 44.30 10/12/2015   LDLCALC 111 (H) 10/12/2015   ALT 18 03/22/2014   AST 18 03/22/2014   NA 139 06/02/2016   K 3.6 06/02/2016   CL 98 (L) 06/02/2016   CREATININE 0.94 06/02/2016   BUN 10 06/02/2016   CO2 29 06/02/2016   TSH 1.438 06/02/2016   INR 1.0 RATIO 07/08/2006   HGBA1C 8.0 (H) 10/12/2015   MICROALBUR 1.6 10/16/2015   Reviewed laboratory studies from emergency room negative d-dimer BMP chest x-ray troponin EKG. ASSESSMENT AND PLAN:  Discussed the following assessment and plan:  SOB (shortness of breath) - ? part VCM plus underlying asthmatic triggered by a probable viral infection and possibly GERD. Inhalers aggravate will try nebulizer in office add PPI gget pul - Plan: albuterol (PROVENTIL) (2.5 MG/3ML) 0.083% nebulizer solution 2.5 mg  Essential hypertension  Morbid obesity, unspecified obesity type (HCC)  OSA (obstructive sleep apnea) - problem with mask  so ndoesnt have machine   Pedal edema - hx of same   on going  se epast notes could consider diuretics but not addressed today  Her symptoms may be multifactorial possibly asthmatic is seen more than 1 provider with more than 1 treatment acutely  coming in today for follow-up. At this point I suspect reflux adding possible vocal cord dysfunction with an asthmatic component and untreated sleep apnea because she had difficulty with the mask. She was sent home from work because the way her breathing sounds. Begin her on PPI and get an appointment with her pulmonary doctor I will send a message to help her get in. Expectant  management. She needs follow-up about sleep apnea treatment. She also needs to see her diabetes doctor I'm not sure it was been followed.  -Patient advised to return or notify health care team  if symptoms worsen ,persist or new concerns arise.  Patient Instructions  I think you have some element of vocal cord dysfunction from an irritated airway from the original illness which could've been viral. It is also possible that acid reflux that can be silent could  be keeping this inflammation ongoing. If the inhaler makes things worse do not use it. Your blood work in the emergency room is reassuring except for your blood sugar being up. We need to getappointment with pulmonary Dr Craige Cotta or colleague  about the symptoms you are having and also to discuss  sleep apnea machine problem. It is possible you also have some underlying asthma that is adding to this ongoing symptom.  Begin acid reflux medication and take every day .  Can consider adding a medication like singular that is used for allergy but  Would rather have Dr Craige Cotta decide on this.  You should probably have lung function tests  At some point. Called spirometry  Keep your follow up appts . See endo about the DM  .        Neta Mends. Aleighya Mcanelly M.D.

## 2016-06-05 ENCOUNTER — Encounter: Payer: Self-pay | Admitting: Internal Medicine

## 2016-06-05 ENCOUNTER — Ambulatory Visit (INDEPENDENT_AMBULATORY_CARE_PROVIDER_SITE_OTHER): Payer: BLUE CROSS/BLUE SHIELD | Admitting: Pulmonary Disease

## 2016-06-05 ENCOUNTER — Telehealth: Payer: Self-pay | Admitting: *Deleted

## 2016-06-05 ENCOUNTER — Encounter: Payer: Self-pay | Admitting: Pulmonary Disease

## 2016-06-05 ENCOUNTER — Ambulatory Visit (INDEPENDENT_AMBULATORY_CARE_PROVIDER_SITE_OTHER): Payer: BLUE CROSS/BLUE SHIELD | Admitting: Internal Medicine

## 2016-06-05 VITALS — BP 162/82 | HR 84 | Ht 67.0 in | Wt 329.0 lb

## 2016-06-05 VITALS — BP 154/82 | HR 92 | Temp 98.6°F | Wt 329.0 lb

## 2016-06-05 DIAGNOSIS — G4733 Obstructive sleep apnea (adult) (pediatric): Secondary | ICD-10-CM

## 2016-06-05 DIAGNOSIS — R0602 Shortness of breath: Secondary | ICD-10-CM

## 2016-06-05 DIAGNOSIS — I10 Essential (primary) hypertension: Secondary | ICD-10-CM

## 2016-06-05 DIAGNOSIS — R05 Cough: Secondary | ICD-10-CM | POA: Diagnosis not present

## 2016-06-05 DIAGNOSIS — R059 Cough, unspecified: Secondary | ICD-10-CM

## 2016-06-05 DIAGNOSIS — R600 Localized edema: Secondary | ICD-10-CM

## 2016-06-05 LAB — NITRIC OXIDE: Nitric Oxide: 17

## 2016-06-05 MED ORDER — AZELASTINE-FLUTICASONE 137-50 MCG/ACT NA SUSP: 2.0000 | Freq: Every day | NASAL | 6 refills | Status: DC

## 2016-06-05 MED ORDER — ALBUTEROL SULFATE (2.5 MG/3ML) 0.083% IN NEBU
2.5000 mg | INHALATION_SOLUTION | Freq: Once | RESPIRATORY_TRACT | Status: AC
  Administered 2016-06-05: 2.5 mg via RESPIRATORY_TRACT

## 2016-06-05 MED ORDER — FELODIPINE ER 5 MG PO TB24: 10.0000 mg | ORAL_TABLET | Freq: Every day | ORAL | 0 refills | Status: DC

## 2016-06-05 MED ORDER — PANTOPRAZOLE SODIUM 40 MG PO TBEC: 40.0000 mg | DELAYED_RELEASE_TABLET | Freq: Every day | ORAL | 3 refills | Status: DC

## 2016-06-05 NOTE — Telephone Encounter (Signed)
-----   Message from Coralyn HellingVineet Sood, MD sent at 06/05/2016 12:22 PM EST ----- [name],  Please schedule [name] for an ROV with me or one of the NPs to assess her dyspnea.  Thanks.  V

## 2016-06-05 NOTE — Patient Instructions (Addendum)
I think you have some element of vocal cord dysfunction from an irritated airway from the original illness which could've been viral. It is also possible that acid reflux that can be silent could be keeping this inflammation ongoing. If the inhaler makes things worse do not use it. Your blood work in the emergency room is reassuring except for your blood sugar being up. We need to getappointment with pulmonary Dr Craige CottaSood or colleague  about the symptoms you are having and also to discuss  sleep apnea machine problem. It is possible you also have some underlying asthma that is adding to this ongoing symptom.  Begin acid reflux medication and take every day .  Can consider adding a medication like singular that is used for allergy but  Would rather have Dr Craige CottaSood decide on this.  You should probably have lung function tests  At some point. Called spirometry  Keep your follow up appts . See endo about the DM  .

## 2016-06-05 NOTE — Addendum Note (Signed)
Addended by: Raj JanusADKINS, Jorja Empie T on: 06/05/2016 05:34 PM   Modules accepted: Orders

## 2016-06-05 NOTE — Patient Instructions (Addendum)
Start taking the antiacid medication prescribed by her primary care doctor We will start you on chlorphenirmine 8 mg 3 times daily and dymista nasal spray We'll schedule you for pulmonary function tests We will reorder your CPAP at 15 with nasal pillows  Return to clinic in 1-2 months

## 2016-06-05 NOTE — Progress Notes (Signed)
[name] BorosSharon D Locher    [id number]    Jul 12, 1960  Primary Care Physician:PANOSH,WANDA [name], MD  Referring Physician: [name], MD [address] [place], [place] [id number]  Chief complaint:  Consult for cough, dyspnea  HPI: 56 year old with past medical history of severe untreated sleep apnea, diabetes, hypertension, hyperlipidemia. She had an episode of bronchitis in mid December was treated initially with antibiotics and prednisone and then another course of antibiotic. These do not really improved and she continues to have symptoms of daily cough, nonproductive in nature, dyspnea, wheezing. She had an evaluation in the ED last week with normal labs including a d dimer, CXR that was negative for PNA. She is maintained on proair but it makes her cough. She saw Dr. Fabian SharpPanosh today who started her on antiacid medication for GERD. She has history of seasonal allergies. She currently complains of rhinitis, postnasal drip and occasional heartburn symptoms.  She has history of sleep apnea and started on CPAP at 16 in 2017. She stopped using it since the mask was suffocating her. The CPAP machine was taken back by the insurance company because she was not using it. She wants to retry the CPAP with a different interface now but was told that she may need a new sleep study.  Outpatient Encounter Prescriptions as of 06/05/2016  Medication Sig  . albuterol (PROAIR HFA) 108 (90 Base) MCG/ACT inhaler Inhale 1-2 puffs into the lungs 2 (two) times daily as needed for wheezing or shortness of breath.  . Ascorbic Acid (VITAMIN C PO) Take 1 tablet by mouth at bedtime.  . felodipine (PLENDIL) 5 MG 24 hr tablet TAKE 2 TABLETS DAILY  . KLOR-CON M20 20 MEQ tablet TAKE 1 TABLET DAILY  . Multiple Vitamin (MULTIVITAMIN WITH MINERALS) TABS tablet Take 1 tablet by mouth at bedtime. One a Day 50 plus  . pantoprazole (PROTONIX) 40 MG tablet Take 1 tablet (40 mg total) by mouth daily.   No  facility-administered encounter medications on file as of 06/05/2016.     Allergies as of 06/05/2016 - Review Complete 06/05/2016  Allergen Reaction Noted  . Candesartan cilexetil Other (See Comments)   . Codeine Hives, Itching, and Swelling 06/02/2012  . Ibuprofen Hives   . Telmisartan Other (See Comments)   . Zithromax [azithromycin] Diarrhea 06/02/2012    Past Medical History:  Diagnosis Date  . Allergic rhinitis   . Diabetes mellitus   . Hyperlipidemia   . Hypertension    x many years (since birht of her son at 3329) Had workup with renal artery angiogram that showed no evidence for fibromuscular dysplasia ( no significant renal artery stenosis.) ECHO (8/11) EF 60-65%, mild LVH, no regional WMAs.  . Low back pain   . Morbid obesity (HCC)   . OSA (obstructive sleep apnea) 12/28/2015    Past Surgical History:  Procedure Laterality Date  . ABDOMINAL HYSTERECTOMY     partial for fibroids  . TUBAL LIGATION      Family History  Problem Relation Age of Onset  . Cancer Mother   . Hypertension Mother   . Other Father     died in MVA    Social History   Social History  . Marital status: Married    Spouse name: N/A  . Number of children: N/A  . Years of education: N/A   Occupational History  . Not on file.   Social History Main Topics  . Smoking status: Never Smoker  .  Smokeless tobacco: Never Used  . Alcohol use No  . Drug use: No  . Sexual activity: Not on file   Other Topics Concern  . Not on file   Social History Narrative   Married   Regular exercise-yes   HH of 4    26 and 22    No pets   Works at [employer]          Review of systems: Review of Systems  Constitutional: Negative for fever and chills.  HENT: Negative.   Eyes: Negative for blurred vision.  Respiratory: as per HPI  Cardiovascular: Negative for chest pain and palpitations.  Gastrointestinal: Negative for vomiting, diarrhea, blood per rectum. Genitourinary: Negative for  dysuria, urgency, frequency and hematuria.  Musculoskeletal: Negative for myalgias, back pain and joint pain.  Skin: Negative for itching and rash.  Neurological: Negative for dizziness, tremors, focal weakness, seizures and loss of consciousness.  Endo/Heme/Allergies: Negative for environmental allergies.  Psychiatric/Behavioral: Negative for depression, suicidal ideas and hallucinations.  All other systems reviewed and are negative.  Physical Exam: Blood pressure (!) 162/82, pulse 84, height 5\' 7"  (1.702 m), weight (!) 329 lb (149.2 kg), SpO2 97 %. Gen:      No acute distress HEENT:  EOMI, sclera anicteric Neck:     No masses; no thyromegaly Lungs:    Clear to auscultation bilaterally; normal respiratory effort CV:         Regular rate and rhythm; no murmurs Abd:      + bowel sounds; soft, non-tender; no palpable masses, no distension Ext:    No edema; adequate peripheral perfusion Skin:      Warm and dry; no rash Neuro: alert and oriented x 3 Psych: normal mood and affect  Data Reviewed: FENO 06/05/16- 17 Chest x-ray 06/02/16- no active cardio pulmonary disease. Images reviewed.   Assessment:  Chronic cough Dyspnea Cough is likely postinfectious in nature from recent bronchitis. I suspect that her postnasal drip, acid reflux, untreated OSA are contributing to the problem. Suspicion for asthma is not high as FENO is low in the office today. She will be scheduled for pulmonary function tests I have advised her to start on antiacid medication. We'll also start her on chlorpheniramine antihistamine and dymista nasal spray.   Untreated OSA Reorder CPAP at 15 with nasal pillows.   Plan/Recommendations: - Start antiacid meds, chlorpheniramine, dymista - PFTs - Reorder CPAP  Chilton Greathouse MD [facility] Pager [phone] 06/05/2016, 12:30 PM  CC: Panosh, Neta Mends, MD

## 2016-06-07 NOTE — Telephone Encounter (Signed)
LM to schedule appt

## 2016-06-10 NOTE — Telephone Encounter (Signed)
Patient called back stating she had recd call to schedule OV.  She was seen by Dr. Isaiah SergeMannam on 06/05/2016 and has PFT and follow up scheduled already for 07/24/2016 with Dr. Isaiah SergeMannam.  May close this message.

## 2016-06-18 ENCOUNTER — Telehealth: Payer: Self-pay | Admitting: Pulmonary Disease

## 2016-06-18 NOTE — Telephone Encounter (Signed)
Opened in error

## 2016-06-24 ENCOUNTER — Encounter: Payer: Self-pay | Admitting: Adult Health

## 2016-06-24 ENCOUNTER — Ambulatory Visit (INDEPENDENT_AMBULATORY_CARE_PROVIDER_SITE_OTHER): Payer: BLUE CROSS/BLUE SHIELD | Admitting: Adult Health

## 2016-06-24 ENCOUNTER — Other Ambulatory Visit: Payer: BLUE CROSS/BLUE SHIELD

## 2016-06-24 VITALS — HR 94 | Wt 323.4 lb

## 2016-06-24 DIAGNOSIS — H1013 Acute atopic conjunctivitis, bilateral: Secondary | ICD-10-CM | POA: Diagnosis not present

## 2016-06-24 DIAGNOSIS — R0602 Shortness of breath: Secondary | ICD-10-CM

## 2016-06-24 DIAGNOSIS — J309 Allergic rhinitis, unspecified: Secondary | ICD-10-CM | POA: Diagnosis not present

## 2016-06-24 DIAGNOSIS — J9809 Other diseases of bronchus, not elsewhere classified: Secondary | ICD-10-CM | POA: Diagnosis not present

## 2016-06-24 MED ORDER — BENZONATATE 200 MG PO CAPS: 200.0000 mg | ORAL_CAPSULE | Freq: Three times a day (TID) | ORAL | 1 refills | Status: DC | PRN

## 2016-06-24 NOTE — Patient Instructions (Addendum)
Begin Zyrtec 10mg  At bedtime  .  Begin Delsym 2 tsp Twice daily  For cough , As needed   Begin Tessalon Three times a day  .  Continue on Mucinex Twice daily  For congestion As needed   Continue on Protonix daily  Continue on Dysmista .  Follow up Dr. Isaiah SergeMannam next month as planned with PFT .  Labs today .  Please contact office for sooner follow up if symptoms do not improve or worsen or seek emergency care

## 2016-06-24 NOTE — Progress Notes (Signed)
@Patient  ID: [name], female    DOB: 1960-07-30, 56 y.o.   MRN: [id number]  Chief Complaint  Patient presents with  . Follow-up    Cough     Referring provider: Madelin Headings, MD  HPI: 56 yo female never smoker seen for sleep consult found to have severe OSA on sleep study 12/2015 .   TEST  12/06/15, AHI 43.6, SaO2 low 55%.   06/24/16  Follow up : Cough  Pt returns for 2 week follow up  . She was seen in office last for flare of cough and rhinitis . She was recommended to start on Chlortrimeton Three times a day  .  Says she  developed bronchitis with cough , congestion with thick copious mucus  Eating makes it worse. Fumes, dust. Seem to aggravate it as well.  Told she had seasonal asthma/allergies , given albuterol inhaler.  No previous allergy eval  Feels something is stuck in throat. At time . No dysphagia .  Placed on On dymista and PPI by PCP .   Previously dx w/ OSA started on CPAP but was unable to tolerate.    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  No pets. No chickens/birds, no macrodantin or amiodarone ,  From this area. .      Allergies  Allergen Reactions  . Candesartan Cilexetil Other (See Comments)    Reaction to Atacand - possibly drowsiness or diarrhea or nausea  . Codeine Hives, Itching and Swelling    Possible throat swelling - pt does not recall this reaction  . Ibuprofen Hives  . Telmisartan Other (See Comments)    Reaction to Micardis - possibly drowsiness or diarrhea or nausea  . Zithromax [Azithromycin] Diarrhea         Immunization History  Administered Date(s) Administered  . Td 08/30/1996    Past Medical History:  Diagnosis Date  . Allergic rhinitis   . Diabetes mellitus   . Hyperlipidemia   . Hypertension    x many years (since birht of her son at 67) Had workup with renal artery angiogram that showed no evidence for fibromuscular dysplasia ( no significant renal artery stenosis.) ECHO (8/11) EF  60-65%, mild LVH, no regional WMAs.  . Low back pain   . Morbid obesity (HCC)   . OSA (obstructive sleep apnea) 12/28/2015    Tobacco History: History  Smoking Status  . Never Smoker  Smokeless Tobacco  . Never Used   Counseling given: Not Answered   Outpatient Encounter Prescriptions as of 06/24/2016  Medication Sig  . albuterol (PROAIR HFA) 108 (90 Base) MCG/ACT inhaler Inhale 1-2 puffs into the lungs 2 (two) times daily as needed for wheezing or shortness of breath.  . Ascorbic Acid (VITAMIN C PO) Take 1 tablet by mouth at bedtime.  . Azelastine-Fluticasone (DYMISTA) 137-50 MCG/ACT SUSP Place 2 sprays into the nose daily.  . felodipine (PLENDIL) 5 MG 24 hr tablet Take 2 tablets (10 mg total) by mouth daily.  [name] KLOR-CON M20 20 MEQ tablet TAKE 1 TABLET DAILY  . Multiple Vitamin (MULTIVITAMIN WITH MINERALS) TABS tablet Take 1 tablet by mouth at bedtime. One a Day 50 plus  . pantoprazole (PROTONIX) 40 MG tablet Take 1 tablet (40 mg total) by mouth daily.  . benzonatate (TESSALON) 200 MG capsule Take 1 capsule (200 mg total) by mouth 3 (three) times daily as needed for cough.   No facility-administered encounter medications on file as of 06/24/2016.  Review of Systems  Constitutional:   No  weight loss, night sweats,  Fevers, chills,  +fatigue, or  lassitude.  HEENT:   No headaches,  Difficulty swallowing,  Tooth/dental problems, or  Sore throat,                No sneezing, itching, ear ache,  +nasal congestion, post nasal drip,   CV:  No chest pain,  Orthopnea, PND, swelling in lower extremities, anasarca, dizziness, palpitations, syncope.   GI  No heartburn, indigestion, abdominal pain, nausea, vomiting, diarrhea, change in bowel habits, loss of appetite, bloody stools.   Resp:  .  No chest wall deformity  Skin: no rash or lesions.  GU: no dysuria, change in color of urine, no urgency or frequency.  No flank pain, no hematuria   MS:  No joint pain or swelling.  No  decreased range of motion.  No back pain.    Physical Exam  Pulse 94   Wt (!) 323 lb 6.4 oz (146.7 kg)   SpO2 96%   BMI 50.65 kg/m   GEN: A/Ox3; pleasant , NAD,obese    HEENT:  [place]/AT,  EACs-clear, TMs-wnl, NOSE-clear drainage , THROAT-clear, no lesions, no postnasal drip or exudate noted.   NECK:  Supple w/ fair ROM; no JVD; normal carotid impulses w/o bruits; no thyromegaly or nodules palpated; no lymphadenopathy.    RESP  Clear  P & A; w/o, wheezes/ rales/ or rhonchi. no accessory muscle use, no dullness to percussion  CARD:  RRR, no m/r/g, 1-2 +  peripheral edema, pulses intact, no cyanosis or clubbing.  GI:   Soft & nt; nml bowel sounds; no organomegaly or masses detected.   Musco: Warm bil, no deformities or joint swelling noted.   Neuro: alert, no focal deficits noted.    Skin: Warm, no lesions or rashes  Psych:  No change in mood or affect. No depression or anxiety.  No memory loss.  Lab Results:    BNP    Component Value Date/Time   BNP 22.2 06/02/2016 1458    ProBNP    Component Value Date/Time   PROBNP <30.0 11/23/2007 0420    Imaging: Dg Chest 2 View  Result Date: 06/02/2016 CLINICAL DATA:  Shortness of breath and cough for 1 month EXAM: CHEST  2 VIEW COMPARISON:  11/17/2009 FINDINGS: The cardiac shadow is mildly prominent but stable. The lungs are well aerated bilaterally. No focal infiltrate or sizable effusion is seen. No acute bony abnormality is noted. IMPRESSION: No active cardiopulmonary disease. Electronically Signed   By: Alcide CleverMark  Lukens M.D.   On: 06/02/2016 16:05     Assessment & Plan:   Recurrent bronchospasm RAD w/ upper airway cough syndrome  Treat for triggers. AR/GERD and cough  Needs PFT   Plan  Patient Instructions  Begin Zyrtec 10mg  At bedtime  .  Begin Delsym 2 tsp Twice daily  For cough , As needed   Begin Tessalon Three times a day  .  Continue on Mucinex Twice daily  For congestion As needed   Continue on Protonix daily   Continue on Dysmista .  Follow up Dr. Isaiah SergeMannam next month as planned with PFT .  Labs today .  Please contact office for sooner follow up if symptoms do not improve or worsen or seek emergency care      Allergic conjunctivitis and rhinitis Flare   Plan  . Patient Instructions  Begin Zyrtec 10mg  At bedtime  .  Begin Delsym 2 tsp  Twice daily  For cough , As needed   Begin Tessalon Three times a day  .  Continue on Mucinex Twice daily  For congestion As needed   Continue on Protonix daily  Continue on Dysmista .  Follow up Dr. Isaiah Serge next month as planned with PFT .  Labs today .  Please contact office for sooner follow up if symptoms do not improve or worsen or seek emergency care         Rubye Oaks, NP 06/25/2016

## 2016-06-25 LAB — RESPIRATORY ALLERGY PROFILE REGION II ~~LOC~~
ALLERGEN, D PTERNOYSSINUS, D1: 1.58 kU/L — AB
Allergen, A. alternata, m6: <0.1 kU/L
Allergen, Cedar tree, t12: <0.1 kU/L
Allergen, Cottonwood, t14: <0.1 kU/L
Allergen, Mouse Urine Protein, e78: <0.1 kU/L
Allergen, Oak,t7: <0.1 kU/L
Allergen, P. notatum, m1: <0.1 kU/L
Bermuda Grass: <0.1 kU/L
Box Elder IgE: <0.1 kU/L
COCKROACH: 0.17 kU/L — AB
Cat Dander: <0.1 kU/L
Common Ragweed: 1.03 kU/L — ABNORMAL HIGH
D. farinae: 3.1 kU/L — ABNORMAL HIGH
IGE (IMMUNOGLOBULIN E), SERUM: 170 kU/L — AB (ref <115)
Johnson Grass: 0.13 kU/L — ABNORMAL HIGH
Rough Pigweed  IgE: <0.1 kU/L
Sheep Sorrel IgE: <0.1 kU/L
Timothy Grass: 1.91 kU/L — ABNORMAL HIGH

## 2016-06-25 NOTE — Progress Notes (Signed)
Pre visit review using our clinic review tool, if applicable. No additional management support is needed unless otherwise documented below in the visit note.  Chief Complaint  Patient presents with  . Annual Exam  . Hypertension  . Diabetes    HPI: Patient  [name]  56 y.o. comes in today for Preventive Health Care visit   She has multiple medical problems including severe hypertension probable sleep apnea morbid obesity hyperlipidemia  Dm  See endocrinology but hasn't made a follow-up appointment is not on any medicine may have had some mild diarrhea with metformin and was supposed to get back after lifestyle intervention. She isn't really checking her sugar right now. Denies any numbness although states that her vision might be a little more blurry over the last 3 weeks  She is working on getting machine to help her with obstructive sleep apnea and evaluation for shortness of breath and pulmonary.  Hypertension she is taking Plendil 20 mg that causes mild leg edema plus potassium. She thinks that her blood pressures been higher work 1 5160. In the remote past had side effects with multiple medicines and concerns.  Due for  Eye check   Health Maintenance  Topic Date Due  . OPHTHALMOLOGY EXAM  01/24/1973  . MAMMOGRAM  01/24/2013  . COLONOSCOPY  01/24/2013  . FOOT EXAM  03/23/2015  . HEMOGLOBIN A1C  04/12/2016  . INFLUENZA VACCINE  10/03/2016 (Originally 12/05/2015)  . PNEUMOCOCCAL POLYSACCHARIDE VACCINE (1) 06/25/2017 (Originally 01/24/1965)  . TETANUS/TDAP  06/25/2017 (Originally 08/31/2006)  . Hepatitis C Screening  06/25/2017 (Originally February 25, 1961)  . HIV Screening  06/25/2017 (Originally 01/24/1978)  . URINE MICROALBUMIN  10/15/2016  . PAP SMEAR  03/09/2017   Health Maintenance Review LIFESTYLE:  Exercise:   walkin Tobacco/ETS:n Alcohol:  no Sugar beverages: tea  Sweet . 1/2 1/2  Sleep:  osa trying to get treated  Drug use: no HH of   3  No pets  Work: 41 +     Going to get pap and mammo     ROS:  GEN/ HEENT: No fever, significant weight changes sweats headaches vision problems hearing changes, CV/ PULM; No chest pain shortness of breath cough, syncope,edema  change in exercise tolerance. GI /GU: No adominal pain, vomiting, change in bowel habits. No blood in the stool. No significant GU symptoms. SKIN/HEME: ,no acute skin rashes suspicious lesions or bleeding. No lymphadenopathy, nodules, masses.  NEURO/ PSYCH:  No neurologic signs such as weakness numbness. No depression anxiety. IMM/ Allergy: No unusual infections.  Allergy .   REST of 12 system review negative except as per HPI   Past Medical History:  Diagnosis Date  . Allergic rhinitis   . Diabetes mellitus   . Hyperlipidemia   . Hypertension    x many years (since birht of her son at 80) Had workup with renal artery angiogram that showed no evidence for fibromuscular dysplasia ( no significant renal artery stenosis.) ECHO (8/11) EF 60-65%, mild LVH, no regional WMAs.  . Low back pain   . Morbid obesity ([place])   . OSA (obstructive sleep apnea) 12/28/2015    Past Surgical History:  Procedure Laterality Date  . ABDOMINAL HYSTERECTOMY     partial for fibroids  . TUBAL LIGATION      Family History  Problem Relation Age of Onset  . Cancer Mother   . Hypertension Mother   . Other Father     died in MVA    Social History  Social History  . Marital status: Married    Spouse name: N/A  . Number of children: N/A  . Years of education: N/A   Social History Main Topics  . Smoking status: Never Smoker  . Smokeless tobacco: Never Used  . Alcohol use No  . Drug use: No  . Sexual activity: Not Asked   Other Topics Concern  . None   Social History Narrative   Married   Regular exercise-yes   HH of 4    28 and 24    No pets   Works at [employer] Medications Prior to Visit  Medication Sig Dispense Refill  . albuterol (PROAIR HFA) 108 (90  Base) MCG/ACT inhaler Inhale 1-2 puffs into the lungs 2 (two) times daily as needed for wheezing or shortness of breath.    . Ascorbic Acid (VITAMIN C PO) Take 1 tablet by mouth at bedtime.    . Azelastine-Fluticasone (DYMISTA) 137-50 MCG/ACT SUSP Place 2 sprays into the nose daily. 23 g 6  . benzonatate (TESSALON) 200 MG capsule Take 1 capsule (200 mg total) by mouth 3 (three) times daily as needed for cough. 30 capsule 1  . Multiple Vitamin (MULTIVITAMIN WITH MINERALS) TABS tablet Take 1 tablet by mouth at bedtime. One a Day 50 plus    . pantoprazole (PROTONIX) 40 MG tablet Take 1 tablet (40 mg total) by mouth daily. 30 tablet 3  . felodipine (PLENDIL) 5 MG 24 hr tablet Take 2 tablets (10 mg total) by mouth daily. 180 tablet 0  . KLOR-CON M20 20 MEQ tablet TAKE 1 TABLET DAILY 90 tablet 0   No facility-administered medications prior to visit.      EXAM:  BP 140/74 (BP Location: Right Arm, Patient Position: Sitting, Cuff Size: Large)   Temp 98.1 F (36.7 C) (Oral)   Ht 5' 6.5" (1.689 m)   Wt (!) 325 lb (147.4 kg)   BMI 51.67 kg/m   Body mass index is 51.67 kg/m. Wt Readings from Last 3 Encounters:  06/26/16 (!) 325 lb (147.4 kg)  06/24/16 (!) 323 lb 6.4 oz (146.7 kg)  06/05/16 (!) 329 lb (149.2 kg)    Physical Exam: Vital signs reviewed UXN:ATFT is a well-developed well-nourished alert cooperative    who appearsr stated age in no acute distress.  HEENT: normocephalic atraumatic , Eyes: PERRL EOM's full, conjunctiva clear, Nares: paten,t no deformity discharge or tenderness., Ears: no deformity EAC's clear TMs with normal landmarks. Mouth: clear OP, no lesions, edema.  Moist mucous membranes. Dentition in adequate repair. NECK: supple without masses, thyromegaly or bruits. CHEST/PULM:  Clear to auscultation and percussion breath sounds equal no wheeze , rales or rhonchi. No chest wall deformities or tenderness. Breast: normal by inspection . No dimpling, discharge, masses,  tenderness or discharge . CV: PMI is nondisplaced, S1 S2 no gallops, murmurs, rubs. Peripheral pulses are full without delay.No JVD .  ABDOMEN: Bowel sounds normal nontender  No guard or rebound, no hepato splenomegal no CVA tenderness.  No hernia. Extremtities:  No clubbing cyanosis or 2+ edema chronic   no acute joint swelling or redness no focal atrophy NEURO:  Oriented x3, cranial nerves 3-12 appear to be intact, no obvious focal weakness,gait within normal limits no abnormal reflexes or asymmetrical SKIN: No acute rashes normal turgor, color, no bruising or petechiae. PSYCH: Oriented, good eye contact, no obvious depression anxiety, cognition and judgment appear normal. LN: no cervical axillary inguinal  adenopathy Diabetic Foot Exam - Simple   Simple Foot Form Diabetic Foot exam was performed with the following findings:  Yes 06/26/2016  9:02 AM  Visual Inspection No deformities, no ulcerations, no other skin breakdown bilaterally:  Yes Sensation Testing Intact to touch and monofilament testing bilaterally:  Yes Pulse Check Posterior Tibialis and Dorsalis pulse intact bilaterally:  Yes Comments       BP Readings from Last 3 Encounters:  06/26/16 140/74  06/05/16 (!) 162/82  06/05/16 (!) 154/82    Lab results reviewed with patient   ASSESSMENT AND PLAN:  Discussed the following assessment and plan:  Visit for preventive health examination - Plan: Lipid panel, Hepatic function panel, Hemoglobin A1c, Microalbumin / creatinine urine ratio, Basic metabolic panel  Essential hypertension - Nausea spironolactone in the past side effects with ace receptor blocker - Plan: Lipid panel, Hepatic function panel, Hemoglobin A1c, Microalbumin / creatinine urine ratio, Basic metabolic panel  Diabetes mellitus type 2 in obese (HCC) - On no medicines suspect out of control get back with endocrinology may be had diarrhea with metformin uncertain - Plan: Lipid panel, Hepatic function panel,  Hemoglobin A1c, Microalbumin / creatinine urine ratio, Basic metabolic panel  OSA (obstructive sleep apnea)  Colon cancer screening - Discussed risk-benefit of each type of testing - Plan: Fecal occult blood, imunochemical  Morbid obesity, unspecified obesity type ([place]) She hasnt seen her diabetologist for a while  Her hypertension is back out of control again but may be willing to ty again  She's had  Hx side effects of multiple medicines. Uncertain if she was on spironolactone at some point She is a candidate for hypertension clinic if she will go. Not certain at this time  Denies declines Pneumovax pneumonia vaccine today She has not been attention to follow-up in the past and has been hard to manage situation. She does say that she has enough help time to get off work to come in for visits and encouraged her to sign up for my chart to help communication such as blood pressure readings and management. She might benefit from spironolactone. Not sure why she is not on an antidiabetic medicine but will see her results when they come back.  Says she will get her mammogram and her Pap smear done through her GYN office.  Patient Care Team: Burnis Medin, MD as PCP - General Druscilla Brownie, MD (Dermatology) Glendale Chard, MD as Attending Physician (Internal Medicine) Philemon Kingdom, MD as Consulting Physician (Internal Medicine) Patient Instructions  Will notify you  of labs when available.   Please get back with Dr. Letta Median   Agree with proceeding with respiratory support shortness of breath evaluation sleep apnea. We need better control of your blood pressure usually have to add another medication. If we are having difficulty with this suggests the hypertension clinic. I will have to go through your records to review your other medicines in which things you haven't had a hard time taking. We may add another blood pressure medicine to see how that goes. I will let she now after we get  the blood test back  Please sign up for my chart send in some blood pressure readings after 3 weeks.   Get your Pap smear annual mammogram. Send in stool test for colon cancer screening as we discussed.  Plan follow-up visit depending on what your labs show and blood pressure monitoring   Health Maintenance, Female Introduction Adopting a healthy lifestyle and getting preventive care can go a  long way to promote health and wellness. Talk with your health care provider about what schedule of regular examinations is right for you. This is a good chance for you to check in with your provider about disease prevention and staying healthy. In between checkups, there are plenty of things you can do on your own. Experts have done a lot of research about which lifestyle changes and preventive measures are most likely to keep you healthy. Ask your health care provider for more information. Weight and diet Eat a healthy diet  Be sure to include plenty of vegetables, fruits, low-fat dairy products, and lean protein.  Do not eat a lot of foods high in solid fats, added sugars, or salt.  Get regular exercise. This is one of the most important things you can do for your health.  Most adults should exercise for at least 150 minutes each week. The exercise should increase your heart rate and make you sweat (moderate-intensity exercise).  Most adults should also do strengthening exercises at least twice a week. This is in addition to the moderate-intensity exercise. Maintain a healthy weight  Body mass index (BMI) is a measurement that can be used to identify possible weight problems. It estimates body fat based on height and weight. Your health care provider can help determine your BMI and help you achieve or maintain a healthy weight.  For females 83 years of age and older:  A BMI below 18.5 is considered underweight.  A BMI of 18.5 to 24.9 is normal.  A BMI of 25 to 29.9 is considered  overweight.  A BMI of 30 and above is considered obese. Watch levels of cholesterol and blood lipids  You should start having your blood tested for lipids and cholesterol at 54 years of age, then have this test every 5 years.  You may need to have your cholesterol levels checked more often if:  Your lipid or cholesterol levels are high.  You are older than 54 years of age.  You are at high risk for heart disease. Cancer screening Lung Cancer  Lung cancer screening is recommended for adults 38-76 years old who are at high risk for lung cancer because of a history of smoking.  A yearly low-dose CT scan of the lungs is recommended for people who:  Currently smoke.  Have quit within the past 15 years.  Have at least a 30-pack-year history of smoking. A pack year is smoking an average of one pack of cigarettes a day for 1 year.  Yearly screening should continue until it has been 15 years since you quit.  Yearly screening should stop if you develop a health problem that would prevent you from having lung cancer treatment. Breast Cancer  Practice breast self-awareness. This means understanding how your breasts normally appear and feel.  It also means doing regular breast self-exams. Let your health care provider know about any changes, no matter how small.  If you are in your 20s or 30s, you should have a clinical breast exam (CBE) by a health care provider every 1-3 years as part of a regular health exam.  If you are 62 or older, have a CBE every year. Also consider having a breast X-ray (mammogram) every year.  If you have a family history of breast cancer, talk to your health care provider about genetic screening.  If you are at high risk for breast cancer, talk to your health care provider about having an MRI and a mammogram every year.  Breast cancer gene (BRCA) assessment is recommended for women who have family members with BRCA-related cancers. BRCA-related cancers  include:  Breast.  Ovarian.  Tubal.  Peritoneal cancers.  Results of the assessment will determine the need for genetic counseling and BRCA1 and BRCA2 testing. Cervical Cancer  Your health care provider may recommend that you be screened regularly for cancer of the pelvic organs (ovaries, uterus, and vagina). This screening involves a pelvic examination, including checking for microscopic changes to the surface of your cervix (Pap test). You may be encouraged to have this screening done every 3 years, beginning at age 25.  For women ages 46-65, health care providers may recommend pelvic exams and Pap testing every 3 years, or they may recommend the Pap and pelvic exam, combined with testing for human papilloma virus (HPV), every 5 years. Some types of HPV increase your risk of cervical cancer. Testing for HPV may also be done on women of any age with unclear Pap test results.  Other health care providers may not recommend any screening for nonpregnant women who are considered low risk for pelvic cancer and who do not have symptoms. Ask your health care provider if a screening pelvic exam is right for you.  If you have had past treatment for cervical cancer or a condition that could lead to cancer, you need Pap tests and screening for cancer for at least 20 years after your treatment. If Pap tests have been discontinued, your risk factors (such as having a new sexual partner) need to be reassessed to determine if screening should resume. Some women have medical problems that increase the chance of getting cervical cancer. In these cases, your health care provider may recommend more frequent screening and Pap tests. Colorectal Cancer  This type of cancer can be detected and often prevented.  Routine colorectal cancer screening usually begins at 54 years of age and continues through 54 years of age.  Your health care provider may recommend screening at an earlier age if you have risk factors  for colon cancer.  Your health care provider may also recommend using home test kits to check for hidden blood in the stool.  A small camera at the end of a tube can be used to examine your colon directly (sigmoidoscopy or colonoscopy). This is done to check for the earliest forms of colorectal cancer.  Routine screening usually begins at age 75.  Direct examination of the colon should be repeated every 5-10 years through 54 years of age. However, you may need to be screened more often if early forms of precancerous polyps or small growths are found. Skin Cancer  Check your skin from head to toe regularly.  Tell your health care provider about any new moles or changes in moles, especially if there is a change in a mole's shape or color.  Also tell your health care provider if you have a mole that is larger than the size of a pencil eraser.  Always use sunscreen. Apply sunscreen liberally and repeatedly throughout the day.  Protect yourself by wearing long sleeves, pants, a wide-brimmed hat, and sunglasses whenever you are outside. Heart disease, diabetes, and high blood pressure  High blood pressure causes heart disease and increases the risk of stroke. High blood pressure is more likely to develop in:  People who have blood pressure in the high end of the normal range (130-139/85-89 mm Hg).  People who are overweight or obese.  People who are African American.  If you  are 73-24 years of age, have your blood pressure checked every 3-5 years. If you are 71 years of age or older, have your blood pressure checked every year. You should have your blood pressure measured twice-once when you are at a hospital or clinic, and once when you are not at a hospital or clinic. Record the average of the two measurements. To check your blood pressure when you are not at a hospital or clinic, you can use:  An automated blood pressure machine at a pharmacy.  A home blood pressure monitor.  If you  are between 5 years and 85 years old, ask your health care provider if you should take aspirin to prevent strokes.  Have regular diabetes screenings. This involves taking a blood sample to check your fasting blood sugar level.  If you are at a normal weight and have a low risk for diabetes, have this test once every three years after 54 years of age.  If you are overweight and have a high risk for diabetes, consider being tested at a younger age or more often. Preventing infection Hepatitis B  If you have a higher risk for hepatitis B, you should be screened for this virus. You are considered at high risk for hepatitis B if:  You were born in a country where hepatitis B is common. Ask your health care provider which countries are considered high risk.  Your parents were born in a high-risk country, and you have not been immunized against hepatitis B (hepatitis B vaccine).  You have HIV or AIDS.  You use needles to inject street drugs.  You live with someone who has hepatitis B.  You have had sex with someone who has hepatitis B.  You get hemodialysis treatment.  You take certain medicines for conditions, including cancer, organ transplantation, and autoimmune conditions. Hepatitis C  Blood testing is recommended for:  Everyone born from [birth year] through 1963.  Anyone with known risk factors for hepatitis C. Sexually transmitted infections (STIs)  You should be screened for sexually transmitted infections (STIs) including gonorrhea and chlamydia if:  You are sexually active and are younger than 54 years of age.  You are older than 54 years of age and your health care provider tells you that you are at risk for this type of infection.  Your sexual activity has changed since you were last screened and you are at an increased risk for chlamydia or gonorrhea. Ask your health care provider if you are at risk.  If you do not have HIV, but are at risk, it may be recommended that you  take a prescription medicine daily to prevent HIV infection. This is called pre-exposure prophylaxis (PrEP). You are considered at risk if:  You are sexually active and do not regularly use condoms or know the HIV status of your partner(s).  You take drugs by injection.  You are sexually active with a partner who has HIV. Talk with your health care provider about whether you are at high risk of being infected with HIV. If you choose to begin PrEP, you should first be tested for HIV. You should then be tested every 3 months for as long as you are taking PrEP. Pregnancy  If you are premenopausal and you may become pregnant, ask your health care provider about preconception counseling.  If you may become pregnant, take 400 to 800 micrograms (mcg) of folic acid every day.  If you want to prevent pregnancy, talk to your health care provider  about birth control (contraception). Osteoporosis and menopause  Osteoporosis is a disease in which the bones lose minerals and strength with aging. This can result in serious bone fractures. Your risk for osteoporosis can be identified using a bone density scan.  If you are 67 years of age or older, or if you are at risk for osteoporosis and fractures, ask your health care provider if you should be screened.  Ask your health care provider whether you should take a calcium or vitamin D supplement to lower your risk for osteoporosis.  Menopause may have certain physical symptoms and risks.  Hormone replacement therapy may reduce some of these symptoms and risks. Talk to your health care provider about whether hormone replacement therapy is right for you. Follow these instructions at home:  Schedule regular health, dental, and eye exams.  Stay current with your immunizations.  Do not use any tobacco products including cigarettes, chewing tobacco, or electronic cigarettes.  If you are pregnant, do not drink alcohol.  If you are breastfeeding, limit  how much and how often you drink alcohol.  Limit alcohol intake to no more than 1 drink per day for nonpregnant women. One drink equals 12 ounces of beer, 5 ounces of wine, or 1 ounces of hard liquor.  Do not use street drugs.  Do not share needles.  Ask your health care provider for help if you need support or information about quitting drugs.  Tell your health care provider if you often feel depressed.  Tell your health care provider if you have ever been abused or do not feel safe at home. This information is not intended to replace advice given to you by your health care provider. Make sure you discuss any questions you have with your health care provider. Document Released: 11/05/2010 Document Revised: 09/28/2015 Document Reviewed: 01/24/2015  2017 Elsevier    Mariann Laster K. Panosh M.D. Lab Results  Component Value Date   WBC 7.1 06/02/2016   HGB 13.6 06/02/2016   HCT 40.8 06/02/2016   PLT 385 06/02/2016   GLUCOSE 192 (H) 06/26/2016   CHOL 187 06/26/2016   TRIG 143.0 06/26/2016   HDL 48.40 06/26/2016   LDLCALC 110 (H) 06/26/2016   ALT 24 06/26/2016   AST 19 06/26/2016   NA 136 06/26/2016   K 3.8 06/26/2016   CL 94 (L) 06/26/2016   CREATININE 1.04 06/26/2016   BUN 15 06/26/2016   CO2 34 (H) 06/26/2016   TSH 1.438 06/02/2016   INR 1.0 RATIO 07/08/2006   HGBA1C 10.0 (H) 06/26/2016   MICROALBUR 26.4 (H) 06/26/2016

## 2016-06-25 NOTE — Assessment & Plan Note (Signed)
RAD w/ upper airway cough syndrome  Treat for triggers. AR/GERD and cough  Needs PFT   Plan  Patient Instructions  Begin Zyrtec 10mg  At bedtime  .  Begin Delsym 2 tsp Twice daily  For cough , As needed   Begin Tessalon Three times a day  .  Continue on Mucinex Twice daily  For congestion As needed   Continue on Protonix daily  Continue on Dysmista .  Follow up Dr. Isaiah SergeMannam next month as planned with PFT .  Labs today .  Please contact office for sooner follow up if symptoms do not improve or worsen or seek emergency care

## 2016-06-25 NOTE — Assessment & Plan Note (Signed)
Flare   Plan  . Patient Instructions  Begin Zyrtec 10mg  At bedtime  .  Begin Delsym 2 tsp Twice daily  For cough , As needed   Begin Tessalon Three times a day  .  Continue on Mucinex Twice daily  For congestion As needed   Continue on Protonix daily  Continue on Dysmista .  Follow up Dr. Isaiah SergeMannam next month as planned with PFT .  Labs today .  Please contact office for sooner follow up if symptoms do not improve or worsen or seek emergency care

## 2016-06-26 ENCOUNTER — Ambulatory Visit (INDEPENDENT_AMBULATORY_CARE_PROVIDER_SITE_OTHER): Payer: BLUE CROSS/BLUE SHIELD | Admitting: Internal Medicine

## 2016-06-26 ENCOUNTER — Encounter: Payer: Self-pay | Admitting: Internal Medicine

## 2016-06-26 VITALS — BP 140/74 | Temp 98.1°F | Ht 66.5 in | Wt 325.0 lb

## 2016-06-26 DIAGNOSIS — E1169 Type 2 diabetes mellitus with other specified complication: Secondary | ICD-10-CM | POA: Diagnosis not present

## 2016-06-26 DIAGNOSIS — I10 Essential (primary) hypertension: Secondary | ICD-10-CM

## 2016-06-26 DIAGNOSIS — G4733 Obstructive sleep apnea (adult) (pediatric): Secondary | ICD-10-CM | POA: Diagnosis not present

## 2016-06-26 DIAGNOSIS — Z0000 Encounter for general adult medical examination without abnormal findings: Secondary | ICD-10-CM | POA: Diagnosis not present

## 2016-06-26 DIAGNOSIS — Z1211 Encounter for screening for malignant neoplasm of colon: Secondary | ICD-10-CM | POA: Diagnosis not present

## 2016-06-26 DIAGNOSIS — E669 Obesity, unspecified: Secondary | ICD-10-CM | POA: Diagnosis not present

## 2016-06-26 LAB — MICROALBUMIN / CREATININE URINE RATIO
Creatinine,U: 254.1 mg/dL
Microalb Creat Ratio: 10.4 mg/g (ref 0.0–30.0)
Microalb, Ur: 26.4 mg/dL — ABNORMAL HIGH (ref 0.0–1.9)

## 2016-06-26 LAB — BASIC METABOLIC PANEL
BUN: 15 mg/dL (ref 6–23)
CALCIUM: 9 mg/dL (ref 8.4–10.5)
CHLORIDE: 94 meq/L — AB (ref 96–112)
CO2: 34 mEq/L — ABNORMAL HIGH (ref 19–32)
Creatinine, Ser: 1.04 mg/dL (ref 0.40–1.20)
GFR: 71.17 mL/min (ref >60.00)
Glucose, Bld: 192 mg/dL — ABNORMAL HIGH (ref 70–99)
Potassium: 3.8 mEq/L (ref 3.5–5.1)
Sodium: 136 mEq/L (ref 135–145)

## 2016-06-26 LAB — LIPID PANEL
CHOLESTEROL: 187 mg/dL (ref 0–200)
HDL: 48.4 mg/dL (ref >39.00)
LDL CALC: 110 mg/dL — AB (ref 0–99)
NonHDL: 138.34
TRIGLYCERIDES: 143 mg/dL (ref 0.0–149.0)
Total CHOL/HDL Ratio: 4
VLDL: 28.6 mg/dL (ref 0.0–40.0)

## 2016-06-26 LAB — HEMOGLOBIN A1C: Hgb A1c MFr Bld: 10 % — ABNORMAL HIGH (ref 4.6–6.5)

## 2016-06-26 LAB — HEPATIC FUNCTION PANEL
ALT: 24 U/L (ref 0–35)
AST: 19 U/L (ref 0–37)
Albumin: 3.8 g/dL (ref 3.5–5.2)
Alkaline Phosphatase: 92 U/L (ref 39–117)
BILIRUBIN DIRECT: 0.1 mg/dL (ref 0.0–0.3)
BILIRUBIN TOTAL: 0.5 mg/dL (ref 0.2–1.2)
TOTAL PROTEIN: 7.2 g/dL (ref 6.0–8.3)

## 2016-06-26 MED ORDER — FELODIPINE ER 5 MG PO TB24: 10.0000 mg | ORAL_TABLET | Freq: Every day | ORAL | 3 refills | Status: DC

## 2016-06-26 MED ORDER — KLOR-CON M20 20 MEQ PO TBCR
20.0000 meq | EXTENDED_RELEASE_TABLET | Freq: Every day | ORAL | 3 refills | Status: DC
Start: 2016-06-26 — End: 2017-08-17

## 2016-06-26 NOTE — Patient Instructions (Addendum)
Will notify you  of labs when available.   Please get back with Dr. Letta Median   Agree with proceeding with respiratory support shortness of breath evaluation sleep apnea. We need better control of your blood pressure usually have to add another medication. If we are having difficulty with this suggests the hypertension clinic. I will have to go through your records to review your other medicines in which things you haven't had a hard time taking. We may add another blood pressure medicine to see how that goes. I will let she now after we get the blood test back  Please sign up for my chart send in some blood pressure readings after 3 weeks.   Get your Pap smear annual mammogram. Send in stool test for colon cancer screening as we discussed.  Plan follow-up visit depending on what your labs show and blood pressure monitoring   Health Maintenance, Female Introduction Adopting a healthy lifestyle and getting preventive care can go a long way to promote health and wellness. Talk with your health care provider about what schedule of regular examinations is right for you. This is a good chance for you to check in with your provider about disease prevention and staying healthy. In between checkups, there are plenty of things you can do on your own. Experts have done a lot of research about which lifestyle changes and preventive measures are most likely to keep you healthy. Ask your health care provider for more information. Weight and diet Eat a healthy diet  Be sure to include plenty of vegetables, fruits, low-fat dairy products, and lean protein.  Do not eat a lot of foods high in solid fats, added sugars, or salt.  Get regular exercise. This is one of the most important things you can do for your health.  Most adults should exercise for at least 150 minutes each week. The exercise should increase your heart rate and make you sweat (moderate-intensity exercise).  Most adults should also do  strengthening exercises at least twice a week. This is in addition to the moderate-intensity exercise. Maintain a healthy weight  Body mass index (BMI) is a measurement that can be used to identify possible weight problems. It estimates body fat based on height and weight. Your health care provider can help determine your BMI and help you achieve or maintain a healthy weight.  For females 67 years of age and older:  A BMI below 18.5 is considered underweight.  A BMI of 18.5 to 24.9 is normal.  A BMI of 25 to 29.9 is considered overweight.  A BMI of 30 and above is considered obese. Watch levels of cholesterol and blood lipids  You should start having your blood tested for lipids and cholesterol at 55 years of age, then have this test every 5 years.  You may need to have your cholesterol levels checked more often if:  Your lipid or cholesterol levels are high.  You are older than 54 years of age.  You are at high risk for heart disease. Cancer screening Lung Cancer  Lung cancer screening is recommended for adults 31-66 years old who are at high risk for lung cancer because of a history of smoking.  A yearly low-dose CT scan of the lungs is recommended for people who:  Currently smoke.  Have quit within the past 15 years.  Have at least a 30-pack-year history of smoking. A pack year is smoking an average of one pack of cigarettes a day for 1 year.  Yearly screening should continue until it has been 15 years since you quit.  Yearly screening should stop if you develop a health problem that would prevent you from having lung cancer treatment. Breast Cancer  Practice breast self-awareness. This means understanding how your breasts normally appear and feel.  It also means doing regular breast self-exams. Let your health care provider know about any changes, no matter how small.  If you are in your 20s or 30s, you should have a clinical breast exam (CBE) by a health care  provider every 1-3 years as part of a regular health exam.  If you are 15 or older, have a CBE every year. Also consider having a breast X-ray (mammogram) every year.  If you have a family history of breast cancer, talk to your health care provider about genetic screening.  If you are at high risk for breast cancer, talk to your health care provider about having an MRI and a mammogram every year.  Breast cancer gene (BRCA) assessment is recommended for women who have family members with BRCA-related cancers. BRCA-related cancers include:  Breast.  Ovarian.  Tubal.  Peritoneal cancers.  Results of the assessment will determine the need for genetic counseling and BRCA1 and BRCA2 testing. Cervical Cancer  Your health care provider may recommend that you be screened regularly for cancer of the pelvic organs (ovaries, uterus, and vagina). This screening involves a pelvic examination, including checking for microscopic changes to the surface of your cervix (Pap test). You may be encouraged to have this screening done every 3 years, beginning at age 46.  For women ages 102-65, health care providers may recommend pelvic exams and Pap testing every 3 years, or they may recommend the Pap and pelvic exam, combined with testing for human papilloma virus (HPV), every 5 years. Some types of HPV increase your risk of cervical cancer. Testing for HPV may also be done on women of any age with unclear Pap test results.  Other health care providers may not recommend any screening for nonpregnant women who are considered low risk for pelvic cancer and who do not have symptoms. Ask your health care provider if a screening pelvic exam is right for you.  If you have had past treatment for cervical cancer or a condition that could lead to cancer, you need Pap tests and screening for cancer for at least 20 years after your treatment. If Pap tests have been discontinued, your risk factors (such as having a new sexual  partner) need to be reassessed to determine if screening should resume. Some women have medical problems that increase the chance of getting cervical cancer. In these cases, your health care provider may recommend more frequent screening and Pap tests. Colorectal Cancer  This type of cancer can be detected and often prevented.  Routine colorectal cancer screening usually begins at 54 years of age and continues through 54 years of age.  Your health care provider may recommend screening at an earlier age if you have risk factors for colon cancer.  Your health care provider may also recommend using home test kits to check for hidden blood in the stool.  A small camera at the end of a tube can be used to examine your colon directly (sigmoidoscopy or colonoscopy). This is done to check for the earliest forms of colorectal cancer.  Routine screening usually begins at age 53.  Direct examination of the colon should be repeated every 5-10 years through 54 years of age. However, you  may need to be screened more often if early forms of precancerous polyps or small growths are found. Skin Cancer  Check your skin from head to toe regularly.  Tell your health care provider about any new moles or changes in moles, especially if there is a change in a mole's shape or color.  Also tell your health care provider if you have a mole that is larger than the size of a pencil eraser.  Always use sunscreen. Apply sunscreen liberally and repeatedly throughout the day.  Protect yourself by wearing long sleeves, pants, a wide-brimmed hat, and sunglasses whenever you are outside. Heart disease, diabetes, and high blood pressure  High blood pressure causes heart disease and increases the risk of stroke. High blood pressure is more likely to develop in:  People who have blood pressure in the high end of the normal range (130-139/85-89 mm Hg).  People who are overweight or obese.  People who are African  American.  If you are 63-34 years of age, have your blood pressure checked every 3-5 years. If you are 10 years of age or older, have your blood pressure checked every year. You should have your blood pressure measured twice-once when you are at a hospital or clinic, and once when you are not at a hospital or clinic. Record the average of the two measurements. To check your blood pressure when you are not at a hospital or clinic, you can use:  An automated blood pressure machine at a pharmacy.  A home blood pressure monitor.  If you are between 28 years and 58 years old, ask your health care provider if you should take aspirin to prevent strokes.  Have regular diabetes screenings. This involves taking a blood sample to check your fasting blood sugar level.  If you are at a normal weight and have a low risk for diabetes, have this test once every three years after 54 years of age.  If you are overweight and have a high risk for diabetes, consider being tested at a younger age or more often. Preventing infection Hepatitis B  If you have a higher risk for hepatitis B, you should be screened for this virus. You are considered at high risk for hepatitis B if:  You were born in a country where hepatitis B is common. Ask your health care provider which countries are considered high risk.  Your parents were born in a high-risk country, and you have not been immunized against hepatitis B (hepatitis B vaccine).  You have HIV or AIDS.  You use needles to inject street drugs.  You live with someone who has hepatitis B.  You have had sex with someone who has hepatitis B.  You get hemodialysis treatment.  You take certain medicines for conditions, including cancer, organ transplantation, and autoimmune conditions. Hepatitis C  Blood testing is recommended for:  Everyone born from [birth year] through 1963.  Anyone with known risk factors for hepatitis C. Sexually transmitted infections  (STIs)  You should be screened for sexually transmitted infections (STIs) including gonorrhea and chlamydia if:  You are sexually active and are younger than 54 years of age.  You are older than 54 years of age and your health care provider tells you that you are at risk for this type of infection.  Your sexual activity has changed since you were last screened and you are at an increased risk for chlamydia or gonorrhea. Ask your health care provider if you are at risk.  If  you do not have HIV, but are at risk, it may be recommended that you take a prescription medicine daily to prevent HIV infection. This is called pre-exposure prophylaxis (PrEP). You are considered at risk if:  You are sexually active and do not regularly use condoms or know the HIV status of your partner(s).  You take drugs by injection.  You are sexually active with a partner who has HIV. Talk with your health care provider about whether you are at high risk of being infected with HIV. If you choose to begin PrEP, you should first be tested for HIV. You should then be tested every 3 months for as long as you are taking PrEP. Pregnancy  If you are premenopausal and you may become pregnant, ask your health care provider about preconception counseling.  If you may become pregnant, take 400 to 800 micrograms (mcg) of folic acid every day.  If you want to prevent pregnancy, talk to your health care provider about birth control (contraception). Osteoporosis and menopause  Osteoporosis is a disease in which the bones lose minerals and strength with aging. This can result in serious bone fractures. Your risk for osteoporosis can be identified using a bone density scan.  If you are 47 years of age or older, or if you are at risk for osteoporosis and fractures, ask your health care provider if you should be screened.  Ask your health care provider whether you should take a calcium or vitamin D supplement to lower your risk  for osteoporosis.  Menopause may have certain physical symptoms and risks.  Hormone replacement therapy may reduce some of these symptoms and risks. Talk to your health care provider about whether hormone replacement therapy is right for you. Follow these instructions at home:  Schedule regular health, dental, and eye exams.  Stay current with your immunizations.  Do not use any tobacco products including cigarettes, chewing tobacco, or electronic cigarettes.  If you are pregnant, do not drink alcohol.  If you are breastfeeding, limit how much and how often you drink alcohol.  Limit alcohol intake to no more than 1 drink per day for nonpregnant women. One drink equals 12 ounces of beer, 5 ounces of wine, or 1 ounces of hard liquor.  Do not use street drugs.  Do not share needles.  Ask your health care provider for help if you need support or information about quitting drugs.  Tell your health care provider if you often feel depressed.  Tell your health care provider if you have ever been abused or do not feel safe at home. This information is not intended to replace advice given to you by your health care provider. Make sure you discuss any questions you have with your health care provider. Document Released: 11/05/2010 Document Revised: 09/28/2015 Document Reviewed: 01/24/2015  2017 Elsevier

## 2016-06-27 NOTE — Progress Notes (Signed)
LMTCB

## 2016-06-28 NOTE — Progress Notes (Signed)
LMOM TCB x2

## 2016-07-03 ENCOUNTER — Telehealth: Payer: Self-pay | Admitting: Internal Medicine

## 2016-07-03 NOTE — Telephone Encounter (Signed)
Pt is returning misty call. Pt states it is ok to leave results on cell

## 2016-07-03 NOTE — Telephone Encounter (Signed)
Noted  

## 2016-07-23 ENCOUNTER — Other Ambulatory Visit: Payer: Self-pay | Admitting: Pulmonary Disease

## 2016-07-23 DIAGNOSIS — R0600 Dyspnea, unspecified: Secondary | ICD-10-CM

## 2016-07-24 ENCOUNTER — Ambulatory Visit (INDEPENDENT_AMBULATORY_CARE_PROVIDER_SITE_OTHER): Payer: BLUE CROSS/BLUE SHIELD | Admitting: Pulmonary Disease

## 2016-07-24 ENCOUNTER — Encounter: Payer: Self-pay | Admitting: Pulmonary Disease

## 2016-07-24 VITALS — BP 146/86 | HR 83 | Ht 67.0 in | Wt 325.0 lb

## 2016-07-24 DIAGNOSIS — R05 Cough: Secondary | ICD-10-CM

## 2016-07-24 DIAGNOSIS — R0602 Shortness of breath: Secondary | ICD-10-CM

## 2016-07-24 DIAGNOSIS — R0600 Dyspnea, unspecified: Secondary | ICD-10-CM

## 2016-07-24 DIAGNOSIS — G4733 Obstructive sleep apnea (adult) (pediatric): Secondary | ICD-10-CM

## 2016-07-24 DIAGNOSIS — R059 Cough, unspecified: Secondary | ICD-10-CM

## 2016-07-24 LAB — PULMONARY FUNCTION TEST
DL/VA % pred: 132 %
DL/VA: 6.75 ml/min/mmHg/L
DLCO UNC % PRED: 77 %
DLCO UNC: 21.51 ml/min/mmHg
DLCO cor % pred: 75 %
DLCO cor: 20.79 ml/min/mmHg
FEF 25-75 PRE: 2.37 L/s
FEF 25-75 Post: 2.54 L/sec
FEF2575-%Change-Post: 7 %
FEF2575-%PRED-POST: 101 %
FEF2575-%Pred-Pre: 94 %
FEV1-%Change-Post: 0 %
FEV1-%PRED-POST: 75 %
FEV1-%Pred-Pre: 75 %
FEV1-POST: 1.86 L
FEV1-Pre: 1.87 L
FEV1FVC-%Change-Post: 0 %
FEV1FVC-%PRED-PRE: 105 %
FEV6-%Change-Post: 0 %
FEV6-%PRED-POST: 71 %
FEV6-%PRED-PRE: 72 %
FEV6-POST: 2.16 L
FEV6-Pre: 2.18 L
FEV6FVC-%PRED-POST: 103 %
FEV6FVC-%Pred-Pre: 103 %
FVC-%CHANGE-POST: 0 %
FVC-%Pred-Post: 69 %
FVC-%Pred-Pre: 70 %
FVC-POST: 2.17 L
FVC-Pre: 2.18 L
POST FEV6/FVC RATIO: 100 %
PRE FEV1/FVC RATIO: 85 %
Post FEV1/FVC ratio: 86 %
Pre FEV6/FVC Ratio: 100 %
RV % pred: 81 %
RV: 1.6 L
TLC % PRED: 71 %
TLC: 3.91 L

## 2016-07-24 NOTE — Progress Notes (Signed)
PFT done today. 

## 2016-07-24 NOTE — Progress Notes (Addendum)
[name] BorosSharon D Park    [id number]    22-Oct-1960  Primary Care Physician:Park,[name] [name] DanaKOTVAN, MD  Referring Physician: [name], MD [address] [place], [place] [id number]  Chief complaint:   Follow up for upper airway cough syndrome. OSA  HPI: 56 year old with past medical history of severe untreated sleep apnea, diabetes, hypertension, hyperlipidemia. She had an episode of bronchitis in mid December was treated initially with antibiotics and prednisone and then another course of antibiotic. These do not really improved and she continues to have symptoms of daily cough, nonproductive in nature, dyspnea, wheezing. She had an evaluation in the ED last week with normal labs including a d dimer, CXR that was negative for PNA. She is maintained on proair but it makes her cough. She saw Dr. Fabian SharpPanosh today who started her on antiacid medication for GERD. She has history of seasonal allergies. She currently complains of rhinitis, postnasal drip and occasional heartburn symptoms.  She has history of sleep apnea and started on CPAP at 16 in 2017. She stopped using it since the mask was suffocating her. The CPAP machine was taken back by the insurance company because she was not using it. She wants to retry the CPAP with a different interface now but was told that she may need a new sleep study.  Interim History: Cough is slightly improved but still has daily symptoms with clear mucus, postnasal drip. She did not tolerate chlorpheniramine as it made her sick and benadryl makes her sleepy. She is being maintained on zytec, dymista nasal spray, protonix and albuterol inhaler.  She tried mucinex but it made the cough worse. She tolerates the delsym better.   Outpatient Encounter Prescriptions as of 07/24/2016  Medication Sig  . albuterol (PROAIR HFA) 108 (90 Base) MCG/ACT inhaler Inhale 1-2 puffs into the lungs 2 (two) times daily as needed for wheezing or shortness of breath.  .  Ascorbic Acid (VITAMIN C PO) Take 1 tablet by mouth at bedtime.  . Azelastine-Fluticasone (DYMISTA) 137-50 MCG/ACT SUSP Place 2 sprays into the nose daily.  . felodipine (PLENDIL) 5 MG 24 hr tablet Take 2 tablets (10 mg total) by mouth daily.  [name]. KLOR-CON M20 20 MEQ tablet Take 1 tablet (20 mEq total) by mouth daily.  . Multiple Vitamin (MULTIVITAMIN WITH MINERALS) TABS tablet Take 1 tablet by mouth at bedtime. One a Day 50 plus  . pantoprazole (PROTONIX) 40 MG tablet Take 1 tablet (40 mg total) by mouth daily.  . [DISCONTINUED] benzonatate (TESSALON) 200 MG capsule Take 1 capsule (200 mg total) by mouth 3 (three) times daily as needed for cough.   No facility-administered encounter medications on file as of 07/24/2016.     Allergies as of 07/24/2016 - Review Complete 07/24/2016  Allergen Reaction Noted  . Candesartan cilexetil Other (See Comments)   . Codeine Hives, Itching, and Swelling 06/02/2012  . Ibuprofen Hives   . Telmisartan Other (See Comments)   . Zithromax [azithromycin] Diarrhea 06/02/2012    Past Medical History:  Diagnosis Date  . Allergic rhinitis   . Diabetes mellitus   . Hyperlipidemia   . Hypertension    x many years (since birht of her son at 8929) Had workup with renal artery angiogram that showed no evidence for fibromuscular dysplasia ( no significant renal artery stenosis.) ECHO (8/11) EF 60-65%, mild LVH, no regional WMAs.  . Low back pain   . Morbid obesity (HCC)   . OSA (obstructive sleep  apnea) 12/28/2015    Past Surgical History:  Procedure Laterality Date  . ABDOMINAL HYSTERECTOMY     partial for fibroids  . TUBAL LIGATION      Family History  Problem Relation Age of Onset  . Cancer Mother   . Hypertension Mother   . Other Father     died in MVA    Social History   Social History  . Marital status: Married    Spouse name: N/A  . Number of children: N/A  . Years of education: N/A   Occupational History  . Not on file.   Social History  Main Topics  . Smoking status: Never Smoker  . Smokeless tobacco: Never Used  . Alcohol use No  . Drug use: No  . Sexual activity: Not on file   Other Topics Concern  . Not on file   Social History Narrative   Married   Regular exercise-yes   HH of 4    28 and 24    No pets   Works at [employer]         Review of systems: Review of Systems  Constitutional: Negative for fever and chills.  HENT: Negative.   Eyes: Negative for blurred vision.  Respiratory: as per HPI  Cardiovascular: Negative for chest pain and palpitations.  Gastrointestinal: Negative for vomiting, diarrhea, blood per rectum. Genitourinary: Negative for dysuria, urgency, frequency and hematuria.  Musculoskeletal: Negative for myalgias, back pain and joint pain.  Skin: Negative for itching and rash.  Neurological: Negative for dizziness, tremors, focal weakness, seizures and loss of consciousness.  Endo/Heme/Allergies: Negative for environmental allergies.  Psychiatric/Behavioral: Negative for depression, suicidal ideas and hallucinations.  All other systems reviewed and are negative.  Physical Exam: Blood pressure (!) 146/86, pulse 83, height 5\' 7"  (1.702 m), weight (!) 325 lb (147.4 kg), SpO2 95 %. Gen:      No acute distress HEENT:  EOMI, sclera anicteric Neck:     No masses; no thyromegaly Lungs:    Clear to auscultation bilaterally; normal respiratory effort CV:         Regular rate and rhythm; no murmurs Abd:      + bowel sounds; soft, non-tender; no palpable masses, no distension Ext:    No edema; adequate peripheral perfusion Skin:      Warm and dry; no rash Neuro: alert and oriented x 3 Psych: normal mood and affect  Data Reviewed: FENO 06/05/16- 17 Chest x-ray 06/02/16- no active cardio pulmonary disease. I have reviewed the images personally  PFTs 07/24/16 FVC 2.17 (69%) FEV1 1.86 (75%) F/F 86 TLC 71% DLCO 77%, DLCO/VA 132%  Allergy test 06/24/16- sensitivity to cockroach,  ragweed, dust mite. IgE 170  Assessment:  Chronic cough Dyspnea Cough is likely postinfectious from recent bronchitis. I suspect that her postnasal drip, acid reflux, untreated OSA are contributing to the problem. Suspicion for asthma is not high as FENO is low. The PFTs were reviewed with her today. They show no obstruction but there is mild restriction and DLCO impairment which overcorrects for alveolar volume. I suspect these reductions are related to her obesity and body habitus. There is no evidence of interstitial lung disease.  She will continue on her antiacid medication, antihistamine, nasal spray and delsym cough syrup. Although the suspicion for asthma as low, she may have a component of reactive airway disease from her upper airway syndrome. I'll give her a trial of Symbicort.   Untreated OSA We are unable to order CPAP  as there is a break in service and she is not interested in retrying the CPAP. She may be a candidate for dental device. Follow up with Dr. Craige Cotta for further eval.   Plan/Recommendations: - Continue antacid meds, zyrtec, dymista - Continue albuterol, trial of symbicort - Sleep clinic follow up.  Chilton Greathouse MD [facility] Pager [phone] 07/24/2016, 4:25 PM  CC: Park, Neta Mends, MD

## 2016-07-24 NOTE — Patient Instructions (Signed)
Continue your medications including Zyrtec, nasal spray, protonix Continue albuterol rescue inhaler. Will start on Symbicort 80/4.5  Return to clinic in 3 months.

## 2016-08-27 ENCOUNTER — Other Ambulatory Visit: Payer: Self-pay | Admitting: Emergency Medicine

## 2016-08-27 MED ORDER — PANTOPRAZOLE SODIUM 40 MG PO TBEC: 40.0000 mg | DELAYED_RELEASE_TABLET | Freq: Every day | ORAL | 3 refills | Status: DC

## 2016-08-28 ENCOUNTER — Other Ambulatory Visit: Payer: Self-pay | Admitting: Emergency Medicine

## 2016-08-28 MED ORDER — PANTOPRAZOLE SODIUM 40 MG PO TBEC: 40.0000 mg | DELAYED_RELEASE_TABLET | Freq: Every day | ORAL | 0 refills | Status: DC

## 2016-10-29 ENCOUNTER — Institutional Professional Consult (permissible substitution): Payer: BLUE CROSS/BLUE SHIELD | Admitting: Pulmonary Disease

## 2016-11-29 ENCOUNTER — Telehealth: Payer: Self-pay | Admitting: Pulmonary Disease

## 2016-11-29 NOTE — Telephone Encounter (Signed)
Noted  

## 2016-12-02 ENCOUNTER — Ambulatory Visit: Payer: BLUE CROSS/BLUE SHIELD | Admitting: Pulmonary Disease

## 2016-12-09 ENCOUNTER — Telehealth: Payer: Self-pay | Admitting: Internal Medicine

## 2016-12-09 ENCOUNTER — Telehealth: Payer: Self-pay

## 2016-12-09 MED ORDER — METFORMIN HCL ER 500 MG PO TB24: ORAL_TABLET | ORAL | 1 refills | Status: DC

## 2016-12-09 NOTE — Telephone Encounter (Signed)
Patient has been having elevated blood for a couple of days and is feeling bad with headaches and dizziness.  Patient scheduled an appt but would like to know any advice.  She wants to go to the gym and try to get her sugar down.  Please call patient back [phone].

## 2016-12-09 NOTE — Telephone Encounter (Signed)
Submitted in RX..  

## 2016-12-09 NOTE — Telephone Encounter (Signed)
Will submit the medication. Called and explained to patient how to take the medication and how to check the sugars and to write them down.  Judeth CornfieldStephanie can you please schedule this patient the first week in September. She needs to be added, patient needs to be notified of the date so she can plan.  Thank you!

## 2016-12-09 NOTE — Telephone Encounter (Signed)
LM for pt to call back to schedule an appt with Dr. Elvera LennoxGherghe at the beginning of September

## 2016-12-09 NOTE — Telephone Encounter (Signed)
I have not seen this pt in more than 2 years. She need to restart all her meds, start improving diet and exercise and write sugars down  - check 2x a day, rotating checks. I would like to see her in 3-4 weeks with her log.

## 2016-12-09 NOTE — Telephone Encounter (Signed)
The only medication I see in the last OV was metformin at dinner, is this still correct? She was on no other medications at the last visit. I will get her scheduled just wanted to clarify if any other medications were needed.  Thanks!

## 2016-12-09 NOTE — Telephone Encounter (Signed)
Called patient in regards to the sugars. Patient states that she has not been checking like she should. The only recent sugars she has:   8/5- @ 2:09- 469  8/6- before breakfast- 295  Patient states she has not been compliant, but states that she now understands that she needs to take care of herself. She has ordered a total gym for her house so she will be able to work out. She does not know if you would want her on medicine until she can get her work out under control. Patient states that she wants to live, and know if she does not start taking care of herself she will not live a long life.  How to proceed? Thank you!

## 2016-12-09 NOTE — Telephone Encounter (Signed)
OK, let's send a Rx for Metformin ER 500 mg: Please start Metformin ER 500 mg with dinner x 4 days. If you tolerate this well, add another Metformin tablet (500 mg) with breakfast x 4 days. If you tolerate this well, add another metformin tablet with dinner (total 1000 mg) x 4 days. If you tolerate this well, add another metformin tablet with breakfast (total 1000 mg). Continue with 1000 mg of metformin 2x a day with breakfast and dinner.

## 2016-12-12 ENCOUNTER — Ambulatory Visit: Payer: BLUE CROSS/BLUE SHIELD | Admitting: Internal Medicine

## 2016-12-13 ENCOUNTER — Telehealth: Payer: Self-pay | Admitting: Internal Medicine

## 2016-12-13 NOTE — Telephone Encounter (Signed)
Patient called in reference to needing lancets and strips for One Touch verio IQ. Patient stated Dr. Lafe GarinGherge told her she was going to check if insurance will cover this. Patient also had a question about sugar going up when working out. Please call patient and advise. OK to leave message.    [id number] - [place], [place] - [address] [facility] [address] [phone] (Phone) [phone] (Fax)

## 2016-12-16 ENCOUNTER — Other Ambulatory Visit: Payer: Self-pay

## 2016-12-16 MED ORDER — ONETOUCH LANCETS MISC: 5 refills | Status: DC

## 2016-12-16 MED ORDER — GLUCOSE BLOOD VI STRP: ORAL_STRIP | 5 refills | Status: DC

## 2016-12-16 MED ORDER — BAYER MICROLET LANCETS MISC: 5 refills | Status: DC

## 2016-12-16 NOTE — Telephone Encounter (Signed)
Called patient and advised of the meter and strips that she requested was not covered. I will get a meter and place it up front. Strips already called into pharmacy.

## 2016-12-18 NOTE — Telephone Encounter (Signed)
LM for pt to call back to schedule appt in beginning of September

## 2016-12-25 ENCOUNTER — Ambulatory Visit: Payer: BLUE CROSS/BLUE SHIELD | Admitting: Internal Medicine

## 2016-12-26 ENCOUNTER — Telehealth: Payer: Self-pay | Admitting: Internal Medicine

## 2016-12-26 ENCOUNTER — Telehealth: Payer: Self-pay

## 2016-12-26 LAB — HM DIABETES EYE EXAM

## 2016-12-26 NOTE — Telephone Encounter (Signed)
Called patient and advised of MD note to see her at her appointment. Patient understood, and would be here.   

## 2016-12-26 NOTE — Telephone Encounter (Signed)
Called patient and advised of MD note to see her at her appointment. Patient understood, and would be here.

## 2016-12-26 NOTE — Telephone Encounter (Signed)
Please advise. Thank you

## 2016-12-26 NOTE — Telephone Encounter (Signed)
She cancelled multiple appts >> I will need to see her to adjust her regimen. Will see her on 08/27

## 2016-12-26 NOTE — Telephone Encounter (Signed)
BS 280 on Tuesday Aug 21st but bs has been really high in 400's on and off.   Patient does not feel well on metformin; nauseous, diarrhea, sleepy.    Her vision is impaired now.  Patient scheduled for appt 08/27 at 3:15pm. Patient wants to know if there is a diff script that can be called in to lower bs.  Reports that she's been eating very clean and drinking lots of water.  Ty,  -LL

## 2016-12-30 ENCOUNTER — Ambulatory Visit (INDEPENDENT_AMBULATORY_CARE_PROVIDER_SITE_OTHER): Payer: BLUE CROSS/BLUE SHIELD | Admitting: Internal Medicine

## 2016-12-30 ENCOUNTER — Encounter: Payer: Self-pay | Admitting: Internal Medicine

## 2016-12-30 VITALS — BP 140/84 | HR 80 | Wt 307.0 lb

## 2016-12-30 DIAGNOSIS — I10 Essential (primary) hypertension: Secondary | ICD-10-CM

## 2016-12-30 DIAGNOSIS — E669 Obesity, unspecified: Secondary | ICD-10-CM

## 2016-12-30 DIAGNOSIS — E1169 Type 2 diabetes mellitus with other specified complication: Secondary | ICD-10-CM

## 2016-12-30 DIAGNOSIS — E119 Type 2 diabetes mellitus without complications: Secondary | ICD-10-CM

## 2016-12-30 LAB — POCT GLYCOSYLATED HEMOGLOBIN (HGB A1C): HEMOGLOBIN A1C: 13.5

## 2016-12-30 MED ORDER — BASAGLAR KWIKPEN 100 UNIT/ML ~~LOC~~ SOPN: 16.0000 [IU] | PEN_INJECTOR | Freq: Every day | SUBCUTANEOUS | 5 refills | Status: DC

## 2016-12-30 MED ORDER — INSULIN PEN NEEDLE 32G X 4 MM MISC: 5 refills | Status: DC

## 2016-12-30 NOTE — Patient Instructions (Addendum)
Please start: - Basaglar 16 units at bedtime >> if sugars in am are not <150 in the next 4 days, increase to 20 units. Keep going like this until you reach 40 units.  Please return in 1.5 months with your sugar log.

## 2016-12-30 NOTE — Progress Notes (Signed)
Subjective:     Patient ID: [name], female   DOB: 1960-07-01, 56 y.o.   MRN: [id number]  HPI  [name] is a 56 y.o.-year-old female, presenting for f/u for DM2, dx 11/2012 per her report, 2010 per chart review, non-insulin-dependent, uncontrolled, without long term complications. In the past, I also evaluated her for HTN/hyperaldosteronism/bilateral leg edema, however, she refused further investigation and this is now managed successfully by PCP. Last visit 2.5 year ago!  She started to feel poorly and actually could not see her computer screen >> checked sugars >> 480. Since then, she started working out (weights and cardio) and included salads >> lost 18 lbs in last 5 mo. However, sugars are still very high, usually in the 300s throughout the day.  DM2: Last hemoglobin A1c was: Lab Results  Component Value Date   HGBA1C 10.0 (H) 06/26/2016   HGBA1C 8.0 (H) 10/12/2015   HGBA1C 7.6 (H) 07/11/2014    Pt is not on meds for DM.   She was on Metformin XR 500 mg po daily >> not taking it b/c diarrhea  She is checking sugars 1-2x a day: - 89-129 fasting >> n/c >> 120s >> 290, 315-380 - 2h after b'fast: 121-126 >> n/c - before lunch: n/c - 2h after lunch: 130s >> n/c - before dinner (after gym): 315-325 ? hypoglycemia awareness.  Highest sugar was 130's >> 480.   - No CKD, last BUN/creatinine:  Lab Results  Component Value Date   BUN 15 06/26/2016   CREATININE 1.04 06/26/2016   - last set of lipids: Lab Results  Component Value Date   CHOL 187 06/26/2016   HDL 48.40 06/26/2016   LDLCALC 110 (H) 06/26/2016   TRIG 143.0 06/26/2016   CHOLHDL 4 06/26/2016   - last eye exam was 12/2016 >> No DR. - she has numbness and tingling in her R toe - but had foot surgery in the past  HTN: - with h/o hyperaldosteronism - see Assessment section.  Now on Amlodipine >> increased leg swelling  Review of Systems Constitutional: + weight loss (18 lbs in last 5 mo), no  fatigue, no subjective hyperthermia, no subjective hypothermia, + nocturia Eyes: no blurry vision, no xerophthalmia ENT: no sore throat, no nodules palpated in throat, no dysphagia, no odynophagia, no hoarseness Cardiovascular: no CP/no SOB/no palpitations/+ leg swelling Respiratory: no cough/no SOB/no wheezing Gastrointestinal: no N/no V/no D/no C/no acid reflux Musculoskeletal: no muscle aches/no joint aches Skin: no rashes, no hair loss Neurological: no tremors//no dizziness  I reviewed pt's medications, allergies, PMH, social hx, family hx, and changes were documented in the history of present illness. Otherwise, unchanged from my initial visit note.   Objective:   Physical Exam BP 140/84 (BP Location: Left Arm, Patient Position: Sitting)   Pulse 80   Wt (!) 307 lb (139.3 kg)   SpO2 97%   BMI 48.08 kg/m  Body mass index is 48.08 kg/m. Wt Readings from Last 3 Encounters:  12/30/16 (!) 307 lb (139.3 kg)  07/24/16 (!) 325 lb (147.4 kg)  06/26/16 (!) 325 lb (147.4 kg)   Constitutional: obese, in NAD Eyes: PERRLA, EOMI, no exophthalmos ENT: moist mucous membranes, no thyromegaly, no cervical lymphadenopathy Cardiovascular: RRR, No MRG, ++ LE swelling  - B, pitting Respiratory: CTA B Gastrointestinal: abdomen soft, NT, ND, BS+ Musculoskeletal: no deformities, strength intact in all 4 Skin: moist, warm, no rashes Neurological: no tremor with outstretched hands, DTR normal in all 4   Assessment:  1. DM2 - diet-controlled - no complications  2. HTN - Left renal artery ? proximal fibromuscular dysplasia per MRA 10/28/2014 - also, at that time, a probably simple cyst of 2 cm was seen in the upper pole of the right kidney >> pt had renal angiography in 07/2006 >> did not show FMD (Dr Samule Ohm): FINDINGS:  1. Abdominal aorta: Normal abdominal aorta with no atherosclerotic  plaque and no aneurysm.  2. Left renal arteries: There are two left renal arteries of roughly  equal in  size. Both are angiographically normal. The left kidney  appears normal in size.  3. Right renal artery: Single vessel. It is angiographically normal.  The right kidney appears normal in size.  IMPRESSION/RECOMMENDATIONS: Angiographically normal renal arteries.  There is no suggestion of fibromuscular dysplasia.  - hyperaldosteronism 2012 per checks by Dr. Everardo All - patient not on beta blockers, ACE inhibitors, or ARBs at the time 01/15/2011: Potassium 2.9 Aldosterone 16 PRA 0.20 Aldosterone/PRA 80 Cortisol 6.5 at 9 AM  02/25/2011:  Potassium 3.4 Aldosterone 22 PRA 0.18 Aldosterone/PRA 122  02/11/2013: Potassium 3.5 Aldosterone 23 PRA 0.16 Aldosterone/PRA 144  3. Hyperaldosteronism - per tests 2012 and 2014 - see above - ? If primary or secondary >> saline loading test ordered as a confirmatory test for Primary hyperaldosteronism, but pt did not go through with it  - I discussed with her about getting the confirmatory test for primary hyperaldosteronism, but refused  Plan:     1. Patient with long-standing, uncontrolled diabetes, returning after an absence of more than 2 years. Her sugars have been much worse in the last few months and she actually developed complications: Transient loss of vision. She saw her ophthalmologist who confirmed that the vision loss was caused by very high blood sugars. Apparently no retinopathy. He is determined to do whatever it takes to gain control of her DM. - we discussed that her diabetes is not controlled at all right now >> she is glucotoxic >> will need once daily insulin to improve it. I hope this will not be a permanent change, but only transient - she reluctantly agreed to insulin >> demonstrated pen use and actually gave her a NS injection - I advised her to:   Inject in the abdomen  Rotate the injection sites around the belly button  Change needle for each injection  Keep the insulin in use out of the fridge  Patient  Instructions  Please start: - Basaglar 16 units at bedtime >> if sugars in am are not <150 in the next 4 days, increase to 20 units. Keep going like this until you reach 40 units.  Please return in 1.5 months with your sugar log.   - today, HbA1c is 13.5% (higher)  - continue checking sugars at different times of the day - check 2x a day, rotating checks - advised for yearly eye exams >> she is UTD - Return to clinic in 1.5 mo with sugar log   2. HTN - with h/o hyperaldosteronism - may benefit from adding back Spironolactone  - time spent with the patient: 45 min, of which >50% was spent in reviewing her sugars, discussing her hyper-glycemic episodes, demonstrating insulin use, discussing correct inj techniques, and providing further counseling about her diabetes.       Carlus Pavlov, MD PhD [facility]

## 2017-01-01 ENCOUNTER — Encounter: Payer: Self-pay | Admitting: Internal Medicine

## 2017-01-07 ENCOUNTER — Telehealth: Payer: Self-pay | Admitting: Internal Medicine

## 2017-01-07 NOTE — Telephone Encounter (Signed)
Patient called in reference to new monitor. Pharmacy told patient they were faxing a request for new monitor to Dr. Lafe GarinGherge. Patient also stated her sugar in the morning is still over 215 when she wakes up. Patient stated she is taking insulin as directed and stated her vision is really bad. Patient would like to know what to do. Please call patient and advise. OK to leave message.

## 2017-01-08 NOTE — Telephone Encounter (Signed)
Routing to you °

## 2017-01-08 NOTE — Telephone Encounter (Signed)
Please advise 

## 2017-01-09 NOTE — Telephone Encounter (Signed)
I contacted the patient and advised of MD's message via voicemail. Requested a call back from the patient to verify the max insulin dosage taken yesterday.

## 2017-01-09 NOTE — Telephone Encounter (Signed)
What dose of insulin did she get up to? She may need a higher dose or adding another med. Please let me know.

## 2017-01-13 ENCOUNTER — Telehealth: Payer: Self-pay

## 2017-01-13 MED ORDER — ONETOUCH VERIO W/DEVICE KIT: 1.0000 | PACK | Freq: Every day | 0 refills | Status: DC

## 2017-01-13 NOTE — Telephone Encounter (Signed)
OK, let's stay on the current doses for now. Glad sugars are improving.

## 2017-01-13 NOTE — Telephone Encounter (Signed)
Called and discussed issue with patient, send questions over to MD with the most recent units she is taking. Will send in Verio.

## 2017-01-13 NOTE — Telephone Encounter (Signed)
Increased to 28 units, and sugar was 180 yesterday, and 164 this morning. Saturday and Sunday seemed to be good, sugars are going down. Please advise if any changes. Patient does not want to do any other medications if she does not need too.   Vision is still pretty bad, went to eye doctor and eye vision is changing drastically over a week span. They are checking on her eyes constantly. I asked for them to send us a copy, and she will call the office to make sure they sent it.

## 2017-01-13 NOTE — Telephone Encounter (Signed)
Routing to you for review.

## 2017-01-13 NOTE — Telephone Encounter (Signed)
Called patient to advised of MD note to stay on current dose. LVM, and call back number if any other questions.

## 2017-01-13 NOTE — Telephone Encounter (Signed)
Called patient to advised of MD note to stay on current dose. LVM, and call back number if any other questions.   

## 2017-02-27 ENCOUNTER — Other Ambulatory Visit: Payer: Self-pay | Admitting: Internal Medicine

## 2017-03-03 ENCOUNTER — Telehealth: Payer: Self-pay | Admitting: Internal Medicine

## 2017-03-03 ENCOUNTER — Other Ambulatory Visit: Payer: Self-pay

## 2017-03-03 MED ORDER — BASAGLAR KWIKPEN 100 UNIT/ML ~~LOC~~ SOPN: 16.0000 [IU] | PEN_INJECTOR | Freq: Every day | SUBCUTANEOUS | 5 refills | Status: DC

## 2017-03-03 NOTE — Telephone Encounter (Signed)
Submitted

## 2017-03-03 NOTE — Telephone Encounter (Signed)
Pt would [facility] [address]

## 2017-03-03 NOTE — Telephone Encounter (Signed)
Pt rescheduled and right now she is booked to January can we call in rx for her insulin to last her until that appt [facility]

## 2017-03-04 ENCOUNTER — Telehealth: Payer: Self-pay | Admitting: Pulmonary Disease

## 2017-03-04 ENCOUNTER — Ambulatory Visit: Payer: BLUE CROSS/BLUE SHIELD | Admitting: Internal Medicine

## 2017-03-04 NOTE — Telephone Encounter (Signed)
She needs ov- was due back to see PM June 2018. She had OV in July 2018 and cancelled. LMTCB.

## 2017-03-06 NOTE — Telephone Encounter (Signed)
ATC pt, no answer. Left message for pt to call back.  

## 2017-03-06 NOTE — Telephone Encounter (Signed)
Called and spoke with pt and she has an appt next week with PM.  She stated that she has been keeping a close eye on how things have played out this fall.  She stated that she noticed that the runny nose starts up in the fall and her allergy meds that has not been working.  She said that her chest is getting tight and she has started with a cough and wheezing.    She is wanting to know what type of allergy meds that she can take?  Any meds that she can take that will help her get this mess up out of her chest?  She is not able to take the mucinex due to the side effects of this.  PM please advise. Thanks

## 2017-03-06 NOTE — Telephone Encounter (Signed)
Try chlorpheniramine 8 mg tid. Continue dymista nasal spray. Will revaluate in clinic visit.  Chilton GreathousePraveen Devorah Givhan MD [facility] 03/06/2017, 1:37 PM

## 2017-03-07 NOTE — Telephone Encounter (Signed)
Called and spoke with pt and she is aware of recs per PM

## 2017-03-12 ENCOUNTER — Ambulatory Visit: Payer: BLUE CROSS/BLUE SHIELD | Admitting: Pulmonary Disease

## 2017-03-15 DIAGNOSIS — I10 Essential (primary) hypertension: Secondary | ICD-10-CM | POA: Diagnosis not present

## 2017-03-15 DIAGNOSIS — Z23 Encounter for immunization: Secondary | ICD-10-CM | POA: Diagnosis not present

## 2017-03-15 DIAGNOSIS — J302 Other seasonal allergic rhinitis: Secondary | ICD-10-CM | POA: Diagnosis not present

## 2017-03-15 DIAGNOSIS — J018 Other acute sinusitis: Secondary | ICD-10-CM | POA: Diagnosis not present

## 2017-05-13 ENCOUNTER — Ambulatory Visit: Payer: BLUE CROSS/BLUE SHIELD | Admitting: Internal Medicine

## 2017-05-21 ENCOUNTER — Other Ambulatory Visit: Payer: Self-pay | Admitting: Internal Medicine

## 2017-07-02 ENCOUNTER — Other Ambulatory Visit: Payer: Self-pay

## 2017-07-02 MED ORDER — GLUCOSE BLOOD VI STRP: ORAL_STRIP | 1 refills | Status: AC

## 2017-07-02 MED ORDER — ACCU-CHEK GUIDE W/DEVICE KIT: 1.0000 | PACK | Freq: Once | 0 refills | Status: AC

## 2017-07-02 MED ORDER — ACCU-CHEK MULTICLIX LANCET DEV KIT: PACK | 99 refills | Status: AC

## 2017-07-09 ENCOUNTER — Encounter: Payer: Self-pay | Admitting: Internal Medicine

## 2017-07-09 ENCOUNTER — Ambulatory Visit: Payer: BLUE CROSS/BLUE SHIELD | Admitting: Internal Medicine

## 2017-07-09 DIAGNOSIS — J209 Acute bronchitis, unspecified: Secondary | ICD-10-CM | POA: Diagnosis not present

## 2017-07-10 ENCOUNTER — Telehealth: Payer: Self-pay | Admitting: Internal Medicine

## 2017-07-10 NOTE — Telephone Encounter (Signed)
Patient dismissed from [facility] by Carlus Pavlovristina Gherghe, effective July 09, 2017. Dismissal letter sent out by certified / registered mail.  daj

## 2017-07-15 ENCOUNTER — Telehealth: Payer: Self-pay

## 2017-07-15 NOTE — Telephone Encounter (Signed)
Patient called this morning. Patient states she received letter for her dismissal. Patient wanted to call and say per patient's words " I apologize if I was been disrespectful. I do like Dr Lafe Garingherge and respect her and understand that I need to be seen regularly to follow up on the medications. I did not mean to disrespect and want to apologize. I work with a [employer] team and it is hard to be off due to this. I receive diabetic supplies for free through work and started going to the gym. I am not giving up on myself. I hope that you can give me a second chance. I really like you as a doctor, you care and listen and I respect you and understand the importance of been seen." please review and call her back if possible. CB [phone]-Mica Releford V Sabreena Vogan, RMA

## 2017-07-16 NOTE — Telephone Encounter (Signed)
I am sorry, I do not usually go back on these decisions. I wish her all the best - it seems like she is starting to do better and I hope she continues with this.

## 2017-07-16 NOTE — Telephone Encounter (Signed)
Received signed domestic return receipt verifying delivery of certified letter on July 14, 2017. Article number [id number] daj

## 2017-07-18 ENCOUNTER — Telehealth: Payer: Self-pay | Admitting: Internal Medicine

## 2017-07-18 NOTE — Telephone Encounter (Signed)
Called patient. No answer. Left vmail to rtn call.

## 2017-07-18 NOTE — Telephone Encounter (Signed)
Patient returned you call. Please call patient at ph#[phone]

## 2017-07-22 ENCOUNTER — Telehealth: Payer: Self-pay | Admitting: Internal Medicine

## 2017-07-22 NOTE — Telephone Encounter (Signed)
Returned call. No answer.  

## 2017-07-22 NOTE — Telephone Encounter (Signed)
Patient stated she returning the phone call from our office she received last week.   Please advise.

## 2017-07-25 ENCOUNTER — Telehealth: Payer: Self-pay | Admitting: Internal Medicine

## 2017-07-25 ENCOUNTER — Ambulatory Visit: Payer: BLUE CROSS/BLUE SHIELD | Admitting: Adult Health

## 2017-07-25 ENCOUNTER — Other Ambulatory Visit (INDEPENDENT_AMBULATORY_CARE_PROVIDER_SITE_OTHER): Payer: BLUE CROSS/BLUE SHIELD

## 2017-07-25 ENCOUNTER — Ambulatory Visit (INDEPENDENT_AMBULATORY_CARE_PROVIDER_SITE_OTHER)
Admission: RE | Admit: 2017-07-25 | Discharge: 2017-07-25 | Disposition: A | Discharge status: Home / Self Care | Payer: BLUE CROSS/BLUE SHIELD | Source: Ambulatory Visit | Attending: Adult Health | Admitting: Adult Health

## 2017-07-25 ENCOUNTER — Encounter: Payer: Self-pay | Admitting: Adult Health

## 2017-07-25 VITALS — BP 136/74 | HR 80 | Ht 67.0 in | Wt 314.2 lb

## 2017-07-25 DIAGNOSIS — J209 Acute bronchitis, unspecified: Secondary | ICD-10-CM | POA: Insufficient documentation

## 2017-07-25 DIAGNOSIS — R0602 Shortness of breath: Secondary | ICD-10-CM

## 2017-07-25 DIAGNOSIS — R05 Cough: Secondary | ICD-10-CM | POA: Diagnosis not present

## 2017-07-25 DIAGNOSIS — R600 Localized edema: Secondary | ICD-10-CM

## 2017-07-25 LAB — BASIC METABOLIC PANEL
BUN: 18 mg/dL (ref 6–23)
CHLORIDE: 99 meq/L (ref 96–112)
CO2: 30 mEq/L (ref 19–32)
CREATININE: 0.94 mg/dL (ref 0.40–1.20)
Calcium: 9.1 mg/dL (ref 8.4–10.5)
GFR: 79.66 mL/min (ref >60.00)
Glucose, Bld: 229 mg/dL — ABNORMAL HIGH (ref 70–99)
Potassium: 3.5 mEq/L (ref 3.5–5.1)
SODIUM: 138 meq/L (ref 135–145)

## 2017-07-25 LAB — CBC WITH DIFFERENTIAL/PLATELET
BASOS ABS: 0.1 10*3/uL (ref 0.0–0.1)
Basophils Relative: 1.5 % (ref 0.0–3.0)
EOS ABS: 0.1 10*3/uL (ref 0.0–0.7)
Eosinophils Relative: 2.2 % (ref 0.0–5.0)
HCT: 42.6 % (ref 36.0–46.0)
HEMOGLOBIN: 13.9 g/dL (ref 12.0–15.0)
LYMPHS PCT: 42 % (ref 12.0–46.0)
Lymphs Abs: 2.5 10*3/uL (ref 0.7–4.0)
MCHC: 32.7 g/dL (ref 30.0–36.0)
MCV: 93.2 fl (ref 78.0–100.0)
Monocytes Absolute: 0.5 10*3/uL (ref 0.1–1.0)
Monocytes Relative: 8.4 % (ref 3.0–12.0)
Neutro Abs: 2.7 10*3/uL (ref 1.4–7.7)
Neutrophils Relative %: 45.9 % (ref 43.0–77.0)
Platelets: 349 10*3/uL (ref 150.0–400.0)
RBC: 4.58 Mil/uL (ref 3.87–5.11)
RDW: 13.1 % (ref 11.5–15.5)
WBC: 5.9 10*3/uL (ref 4.0–10.5)

## 2017-07-25 LAB — BRAIN NATRIURETIC PEPTIDE: PRO B NATRI PEPTIDE: 8 pg/mL (ref 0.0–100.0)

## 2017-07-25 MED ORDER — BASAGLAR KWIKPEN 100 UNIT/ML ~~LOC~~ SOPN: 20.0000 [IU] | PEN_INJECTOR | Freq: Every day | SUBCUTANEOUS | 0 refills | Status: DC

## 2017-07-25 MED ORDER — DOXYCYCLINE HYCLATE 100 MG PO TABS: 100.0000 mg | ORAL_TABLET | Freq: Two times a day (BID) | ORAL | 0 refills | Status: DC

## 2017-07-25 MED ORDER — BENZONATATE 200 MG PO CAPS: 200.0000 mg | ORAL_CAPSULE | Freq: Three times a day (TID) | ORAL | 1 refills | Status: DC | PRN

## 2017-07-25 NOTE — Progress Notes (Signed)
_0  ID: [name], female    DOB: Jun 25, 1960, 57 y.o.   MRN: [id number]  Chief Complaint  Patient presents with  . Acute Visit    Cough     Referring provider: Burnis Medin, MD  HPI: 57 yo female never smoker seen for sleep consult found to have severe OSA on sleep study 12/2015 -CPAP noncompliant  And chronic cough   TEST  12/06/15, AHI 43.6, SaO2 low 55%.  FENO 06/05/16- 17 Chest x-ray 06/02/16- no active cardio pulmonary disease.   PFTs 07/24/16 FVC 2.17 (69%) FEV1 1.86 (75%) F/F 86 TLC 71% DLCO 77%, DLCO/VA 132%  Allergy test 06/24/16- sensitivity to cockroach, ragweed, dust mite. IgE 170  07/25/2017 Acute OV : Cough  Pt presents for an acute office visit . Complains of 3 months of increased cough Coughing up thick white mcuus. . Severe at times. Has nasal congestion and drainage .  Seen at Urgent care 2 weeks ago, tx w/ steroid taper .  [name] Did not seen any improvement .  Tried robitussin , delsym , albuterol , tessalon. These did not help.  Has chronic leg swelling , more swollen last few months. Worse in evening .  No chest pain , hemoptysis or calf pain   Has OSA , was unable to wear CPAP , it was taken back by DME.       Allergies  Allergen Reactions  . Candesartan Cilexetil Other (See Comments)    Reaction to Atacand - possibly drowsiness or diarrhea or nausea  . Codeine Hives, Itching and Swelling    Possible throat swelling - pt does not recall this reaction  . Ibuprofen Hives  . Telmisartan Other (See Comments)    Reaction to Micardis - possibly drowsiness or diarrhea or nausea  . Zithromax [Azithromycin] Diarrhea         Immunization History  Administered Date(s) Administered  . Influenza,inj,Quad PF,6+ Mos 03/15/2017  . Td 08/30/1996    Past Medical History:  Diagnosis Date  . Allergic rhinitis   . Diabetes mellitus   . Hyperlipidemia   . Hypertension    x many years (since birht of her son at 72) Had workup with renal  artery angiogram that showed no evidence for fibromuscular dysplasia ( no significant renal artery stenosis.) ECHO (8/11) EF 60-65%, mild LVH, no regional WMAs.  . Low back pain   . Morbid obesity ([place])   . OSA (obstructive sleep apnea) 12/28/2015    Tobacco History: Social History   Tobacco Use  Smoking Status Never Smoker  Smokeless Tobacco Never Used   Counseling given: Not Answered   Outpatient Encounter Medications as of 07/25/2017  Medication Sig  . albuterol (PROAIR HFA) 108 (90 Base) MCG/ACT inhaler Inhale 1-2 puffs into the lungs 2 (two) times daily as needed for wheezing or shortness of breath.  . Ascorbic Acid (VITAMIN C PO) Take 1 tablet by mouth at bedtime.  . Azelastine-Fluticasone (DYMISTA) 137-50 MCG/ACT SUSP Place 2 sprays into the nose daily.  . felodipine (PLENDIL) 10 MG 24 hr tablet TAKE 1 TABLET BY MOUTH EVERY DAY  . glucose blood (ACCU-CHEK GUIDE) test strip Use as instructed to check sugar 3 times daily  . Insulin Glargine (BASAGLAR KWIKPEN) 100 UNIT/ML SOPN Inject 0.2 mLs (20 Units total) into the skin at bedtime.  . Insulin Pen Needle (BD PEN NEEDLE NANO U/F) 32G X 4 MM MISC Inject 16 units daily at bedtime  . KLOR-CON M20 20 MEQ tablet Take 1 tablet (  20 mEq total) by mouth daily.  . Lancets Misc. (ACCU-CHEK MULTICLIX LANCET DEV) KIT Use as instructed to check sugar 3 times daily  . metFORMIN (GLUCOPHAGE-XR) 500 MG 24 hr tablet 1 tab dinner x4 days,1 tab breakfast 1 tab dinner x4 days,1 tab breakfast 2 tabs dinner x4 days,  2 tabs breakfast and 2 tabs dinner  . Multiple Vitamin (MULTIVITAMIN WITH MINERALS) TABS tablet Take 1 tablet by mouth at bedtime. One a Day 50 plus  . ONE TOUCH LANCETS MISC Use to check 3 times daily  . pantoprazole (PROTONIX) 40 MG tablet TAKE 1 TABLET (40 MG TOTAL) BY MOUTH DAILY.  . [DISCONTINUED] Insulin Glargine (BASAGLAR KWIKPEN) 100 UNIT/ML SOPN Inject 0.16 mLs (16 Units total) into the skin at bedtime.  . benzonatate (TESSALON) 200  MG capsule Take 1 capsule (200 mg total) by mouth 3 (three) times daily as needed for cough.  . doxycycline (VIBRA-TABS) 100 MG tablet Take 1 tablet (100 mg total) by mouth 2 (two) times daily.  . montelukast (SINGULAIR) 10 MG tablet Take 10 mg by mouth at bedtime.  . [DISCONTINUED] potassium chloride (KLOR-CON) 20 MEQ packet Take 40 mEq by mouth daily.   No facility-administered encounter medications on file as of 07/25/2017.      Review of Systems  Constitutional:   No  weight loss, night sweats,  Fevers, chills, fatigue, or  lassitude.  HEENT:   No headaches,  Difficulty swallowing,  Tooth/dental problems, or  Sore throat,                No sneezing, itching, ear ache,  +nasal congestion, post nasal drip,   CV:  No chest pain,  Orthopnea, PND, swelling in lower extremities, anasarca, dizziness, palpitations, syncope.   GI  No heartburn, indigestion, abdominal pain, nausea, vomiting, diarrhea, change in bowel habits, loss of appetite, bloody stools.   Resp:  No chest wall deformity  Skin: no rash or lesions.  GU: no dysuria, change in color of urine, no urgency or frequency.  No flank pain, no hematuria   MS:  No joint pain or swelling.  No decreased range of motion.  No back pain.    Physical Exam  BP 136/74 (BP Location: Left Arm, Cuff Size: Large)   Pulse 80   Ht 5' 7" (1.702 m)   Wt (!) 314 lb 3.2 oz (142.5 kg)   SpO2 96%   BMI 49.21 kg/m   GEN: A/Ox3; pleasant , NAD, obese    HEENT:  [place]/AT,  EACs-clear, TMs-wnl, NOSE-clear, THROAT-clear, no lesions, no postnasal drip or exudate noted.  Class 3 MP airway   NECK:  Supple w/ fair ROM; no JVD; normal carotid impulses w/o bruits; no thyromegaly or nodules palpated; no lymphadenopathy.     RESP  Clear  P & A; w/o, wheezes/ rales/ or rhonchi. no accessory muscle use, no dullness to percussion  CARD:  RRR, no m/r/g, 2+ peripheral edema, pulses intact, no cyanosis or clubbing. Neg homans sign   GI:   Soft & nt; nml  bowel sounds; no organomegaly or masses detected.   Musco: Warm bil, no deformities or joint swelling noted.   Neuro: alert, no focal deficits noted.    Skin: Warm, no lesions or rashes    Lab Results:  CBC   Imaging: No results found.   Assessment & Plan:   Acute bronchitis Slow to resolve flare with cyclical cough  Check cxr  Check labs with BNP  Check echo to make sure  no CHF   Plan  Patient Instructions  Doxycyline 12m Twice daily  For 1 week , take w/ food .  Chest xray and labs today  2 D Echo .  Continue on Zyrtec 157mAt bedtime  .  Delsym 2 tsp Twice daily  For cough , As needed   Tessalon Three times a day  For cough As needed   Restart Protonix daily  Follow up with Dr. MaVaughan Brownern 6 weeks and As needed   Please contact office for sooner follow up if symptoms do not improve or worsen or seek emergency care          Lower extremity edema LE edema ,  Check bnp  Check echo to r/o chf       TaRexene EdisonNP 07/25/2017

## 2017-07-25 NOTE — Assessment & Plan Note (Signed)
Slow to resolve flare with cyclical cough  Check cxr  Check labs with BNP  Check echo to make sure no CHF   Plan  Patient Instructions  Doxycyline 100mg  Twice daily  For 1 week , take w/ food .  Chest xray and labs today  2 D Echo .  Continue on Zyrtec 10mg  At bedtime  .  Delsym 2 tsp Twice daily  For cough , As needed   Tessalon Three times a day  For cough As needed   Restart Protonix daily  Follow up with Dr. Isaiah SergeMannam in 6 weeks and As needed   Please contact office for sooner follow up if symptoms do not improve or worsen or seek emergency care

## 2017-07-25 NOTE — Telephone Encounter (Addendum)
Spoke to patient. She is currently taking 20 units of Basaglar. Advised sent Rx. Confirmed pharmacy on file.

## 2017-07-25 NOTE — Assessment & Plan Note (Signed)
LE edema ,  Check bnp  Check echo to r/o chf

## 2017-07-25 NOTE — Patient Instructions (Addendum)
Doxycyline 100mg  Twice daily  For 1 week , take w/ food .  Chest xray and labs today  2 D Echo .  Continue on Zyrtec 10mg  At bedtime  .  Delsym 2 tsp Twice daily  For cough , As needed   Tessalon Three times a day  For cough As needed   Restart Protonix daily  Follow up with Dr. Isaiah SergeMannam in 6 weeks and As needed   Please contact office for sooner follow up if symptoms do not improve or worsen or seek emergency care

## 2017-07-25 NOTE — Telephone Encounter (Signed)
Insulin Glargine [facility](BASAGLAR KWIKPEN) 100 UNIT/ML SOPN [[id number]]  [id number] - [place], [place] - [address] AT [facility] [address] DEA #:  [id number]

## 2017-07-25 NOTE — Telephone Encounter (Signed)
Yes, okay to refill for at least 3 months. I am not sure to what dose she actually got to.  You may need to check with her.

## 2017-07-28 ENCOUNTER — Telehealth (HOSPITAL_COMMUNITY): Payer: Self-pay | Admitting: Adult Health

## 2017-07-28 NOTE — Progress Notes (Signed)
LMOMTCB x 1 

## 2017-07-29 ENCOUNTER — Telehealth: Payer: Self-pay | Admitting: Adult Health

## 2017-07-29 NOTE — Telephone Encounter (Signed)
Spoke with pt. Advised her as to why Tammy wanted an Echo. Pt asked several questions, these were answered to be best of my ability. Nothing further was needed.

## 2017-07-31 ENCOUNTER — Ambulatory Visit (HOSPITAL_COMMUNITY): Payer: BLUE CROSS/BLUE SHIELD | Attending: Cardiology

## 2017-07-31 ENCOUNTER — Other Ambulatory Visit: Payer: Self-pay

## 2017-07-31 ENCOUNTER — Telehealth: Payer: Self-pay | Admitting: Adult Health

## 2017-07-31 DIAGNOSIS — R0602 Shortness of breath: Secondary | ICD-10-CM | POA: Diagnosis not present

## 2017-07-31 NOTE — Telephone Encounter (Signed)
Patient is requesting results from the echocardiogram. TP please advise, thanks.

## 2017-07-31 NOTE — Telephone Encounter (Signed)
Attempted to call pt but no answer.   Left message for pt to return our call x1 

## 2017-07-31 NOTE — Telephone Encounter (Signed)
Patient called in very upset w/ the conversation we had on Friday. Verified patient by name, dob, and address b/c did not understand why she was so upset when I called to inform Rx sent to pharmacy per previous notes. Patient then begin to vent about her dismissal from practice, said very rude things about Dr. Elvera LennoxGherghe and staff. Tried to address concerns with pt for over 5 mins. Pt began to yell and make irrational statements. Advised patient would be disconnecting call. Then h/u.

## 2017-08-01 NOTE — Telephone Encounter (Signed)
LMTCB x2  

## 2017-08-01 NOTE — Telephone Encounter (Signed)
Per TP: results are done. Thank you

## 2017-08-01 NOTE — Telephone Encounter (Signed)
Notes recorded by Julio SicksParrett, Tammy S, NP on 08/01/2017 at 1:25 PM EDT Echo is normal , good pump function , mild LVH -no sign of CHF  Unclear why she has LE swelling bilaterally , cont w/ ov recs .Please contact office for sooner follow up if symptoms do not improve or worsen or seek emergency care   Left message for patient to call back.

## 2017-08-01 NOTE — Progress Notes (Signed)
See 3.28.19 phone note

## 2017-08-01 NOTE — Telephone Encounter (Signed)
User: [name], [name] A Date/time: 07/28/17 9:12 AM  Comment: Called pt and lmsg for her to CB to get scheduled for an echo.   Context:  Outcome: Left Message  Phone number: [phone] Phone Type: Mobile  Comm. type: Telephone Call type: Outgoing  Contact: [name] EinsteinWinchester, [name] D Relation to patient: Self

## 2017-08-04 NOTE — Telephone Encounter (Signed)
Per TP: echo did not reveal any reason for the shortness of breath.  Would recommend finishing the doxycycline from the last office and needs ov with MD tomorrow (MW has openings) to reassess.  Thanks.

## 2017-08-04 NOTE — Telephone Encounter (Signed)
Called pt letting her know the results of the echocardiogram and to continue OV recs as discussed.  Pt expressed understanding. Nothing further needed at this time.

## 2017-08-04 NOTE — Telephone Encounter (Signed)
Called and spoke with pt letting her know the results of the echocardiogram.  Also asked pt if she was still having problems with SOB.  Pt stated she is still having increased SOB with no change in it from her last OV.  Tammy, please advise on this for pt. Thanks!

## 2017-08-04 NOTE — Telephone Encounter (Signed)
Left message for patient to call back  

## 2017-08-04 NOTE — Telephone Encounter (Signed)
CB is [phone] (cell).  There are two separate messages back for this patient regarding different issues.

## 2017-08-05 NOTE — Telephone Encounter (Signed)
lmtcb x2 for pt. 

## 2017-08-06 NOTE — Telephone Encounter (Signed)
Patient called back. She states that she is having shortness of breath and still coughing. She would like to be seen to reassess this.     Per note from 3.28 TP suggested patient be seen. Patient has been scheduled to come in on 4.11.19

## 2017-08-06 NOTE — Telephone Encounter (Signed)
Left messages on both patient's cell and home number in hopes that she will call back. Will keep this encounter open until she calls back.

## 2017-08-08 ENCOUNTER — Encounter: Payer: Self-pay | Admitting: *Deleted

## 2017-08-08 NOTE — Progress Notes (Signed)
Pt has not returned call regarding lab and cxr results Letter sent to pt's home address

## 2017-08-11 NOTE — Progress Notes (Signed)
Chief Complaint  Patient presents with  . Follow-up    BP has not been monitored at home, taking Felodipine Bid when she feels pressure is up, questions about endocronology referral    HPI: [name] 57 y.o. come in for Chronic disease management     Last seen by ne 2 2018   bp 140 148 /90   On plendil er  Sometimes takes 1 bid  To keep bp down   Dm  Had been quite high  And released from practice .   discarged from practice   [facility]   Absent  Visit after 2+ years   bg 180 range on insulin 24  And slowly increasing    Nose   osa she finally went for evaluation and has severe  Sleep apnea  And dsat down to 55%   Evaluate.  Working FT   ROS: See pertinent positives and negatives per HPI.  Past Medical History:  Diagnosis Date  . Allergic rhinitis   . Diabetes mellitus   . Hyperlipidemia   . Hypertension    x many years (since birht of her son at 25) Had workup with renal artery angiogram that showed no evidence for fibromuscular dysplasia ( no significant renal artery stenosis.) ECHO (8/11) EF 60-65%, mild LVH, no regional WMAs.  . Low back pain   . Morbid obesity ([place])   . OSA (obstructive sleep apnea) 12/28/2015    Family History  Problem Relation Age of Onset  . Cancer Mother   . Hypertension Mother   . Other Father        died in [place] History   Socioeconomic History  . Marital status: Married    Spouse name: Not on file  . Number of children: Not on file  . Years of education: Not on file  . Highest education level: Not on file  Occupational History  . Not on file  Social Needs  . Financial resource strain: Not on file  . Food insecurity:    Worry: Not on file    Inability: Not on file  . Transportation needs:    Medical: Not on file    Non-medical: Not on file  Tobacco Use  . Smoking status: Never Smoker  . Smokeless tobacco: Never Used  Substance and Sexual Activity  . Alcohol use: No    Alcohol/week: 0.0 oz  . Drug  use: No  . Sexual activity: Not on file  Lifestyle  . Physical activity:    Days per week: Not on file    Minutes per session: Not on file  . Stress: Not on file  Relationships  . Social connections:    Talks on phone: Not on file    Gets together: Not on file    Attends religious service: Not on file    Active member of club or organization: Not on file    Attends meetings of clubs or organizations: Not on file    Relationship status: Not on file  Other Topics Concern  . Not on file  Social History Narrative   Married   Regular exercise-yes   HH of 4    28 and 24    No pets   Works at [employer] Medications Prior to Visit  Medication Sig Dispense Refill  . albuterol (PROAIR HFA) 108 (90 Base) MCG/ACT inhaler Inhale 1-2 puffs into the lungs 2 (two) times daily as needed  for wheezing or shortness of breath.    . Ascorbic Acid (VITAMIN C PO) Take 1 tablet by mouth at bedtime.    . Azelastine-Fluticasone (DYMISTA) 137-50 MCG/ACT SUSP Place 2 sprays into the nose daily. 23 g 6  . benzonatate (TESSALON) 200 MG capsule Take 1 capsule (200 mg total) by mouth 3 (three) times daily as needed for cough. 30 capsule 1  . doxycycline (VIBRA-TABS) 100 MG tablet Take 1 tablet (100 mg total) by mouth 2 (two) times daily. 14 tablet 0  . glucose blood (ACCU-CHEK GUIDE) test strip Use as instructed to check sugar 3 times daily 300 each 1  . Insulin Glargine (BASAGLAR KWIKPEN) 100 UNIT/ML SOPN Inject 0.2 mLs (20 Units total) into the skin at bedtime. 15 pen 0  . Insulin Pen Needle (BD PEN NEEDLE NANO U/F) 32G X 4 MM MISC Inject 16 units daily at bedtime 100 each 5  . KLOR-CON M20 20 MEQ tablet Take 1 tablet (20 mEq total) by mouth daily. 90 tablet 3  . Lancets Misc. (ACCU-CHEK MULTICLIX LANCET DEV) KIT Use as instructed to check sugar 3 times daily 1 each prn  . metFORMIN (GLUCOPHAGE-XR) 500 MG 24 hr tablet 1 tab dinner x4 days,1 tab breakfast 1 tab dinner x4 days,1 tab  breakfast 2 tabs dinner x4 days,  2 tabs breakfast and 2 tabs dinner 360 tablet 1  . montelukast (SINGULAIR) 10 MG tablet Take 10 mg by mouth at bedtime.    . Multiple Vitamin (MULTIVITAMIN WITH MINERALS) TABS tablet Take 1 tablet by mouth at bedtime. One a Day 50 plus    . ONE TOUCH LANCETS MISC Use to check 3 times daily 300 each 5  . pantoprazole (PROTONIX) 40 MG tablet TAKE 1 TABLET (40 MG TOTAL) BY MOUTH DAILY. 90 tablet 0  . felodipine (PLENDIL) 10 MG 24 hr tablet TAKE 1 TABLET BY MOUTH EVERY DAY 90 tablet 0   No facility-administered medications prior to visit.      EXAM:  BP (!) 142/80 (BP Location: Left Arm, Patient Position: Sitting, Cuff Size: Large)   Pulse 90   Temp 98.3 F (36.8 C) (Oral)   Wt (!) 313 lb 6.4 oz (142.2 kg)   BMI 49.09 kg/m   Body mass index is 49.09 kg/m.  GENERAL: vitals reviewed and listed above, alert, oriented, appears well hydrated and in no acute distress looks tired and allergic  HEENT: atraumatic, conjunctiva  clear, no obvious abnormalities on inspection of external nose and ears  NECK: no obvious masses on inspection palpation  LUNGS: clear to auscultation bilaterally, no wheezes, rales or rhonchi, good air movement CV: HRRR, no clubbing cyanosis or    1+ edem,a  Some skin changes notighness or ulcers  nl cap refill  MS: moves all extremities without noticeable focal  abnormality PSYCH: pleasant and cooperative, no obvious depression or anxiety Lab Results  Component Value Date   WBC 5.9 07/25/2017   HGB 13.9 07/25/2017   HCT 42.6 07/25/2017   PLT 349.0 07/25/2017   GLUCOSE 229 (H) 07/25/2017   CHOL 187 06/26/2016   TRIG 143.0 06/26/2016   HDL 48.40 06/26/2016   LDLCALC 110 (H) 06/26/2016   ALT 24 06/26/2016   AST 19 06/26/2016   NA 138 07/25/2017   K 3.5 07/25/2017   CL 99 07/25/2017   CREATININE 0.94 07/25/2017   BUN 18 07/25/2017   CO2 30 07/25/2017   TSH 1.438 06/02/2016   INR 1.0 RATIO 07/08/2006   HGBA1C 9.1 08/12/2017  MICROALBUR 26.4 (H) 06/26/2016   BP Readings from Last 3 Encounters:  08/12/17 (!) 142/80  07/25/17 136/74  12/30/16 140/84   Wt Readings from Last 3 Encounters:  08/12/17 (!) 313 lb 6.4 oz (142.2 kg)  07/25/17 (!) 314 lb 3.2 oz (142.5 kg)  12/30/16 (!) 307 lb (139.3 kg)     ASSESSMENT AND PLAN:  Discussed the following assessment and plan:  Essential hypertension  Medication management - Plan: POC HgB A1c  Morbid obesity, unspecified obesity type ([place])  Diabetes mellitus type 2 in obese ([place]) - Plan: POC HgB A1c  OSA (obstructive sleep apnea) - severe with desat to 55   no  final rx yet As always   Delayed   ot follow up .  She can seek another endocrina and advise  importance of follow up  adherence     It took many years and attempts to have her finally go for sleep evaluation  ,.   Her a1c is down from 13  But  Needs to keep going disc other meds  Disc ht control   Uncertain if carvedilol ever had a se of now  Restart  Low dose and fu cpx in   3 mos   -Patient advised to return or notify health care team  if  new concerns arise. Total visit 40 mins > 50% spent counseling and coordinating care as indicated in above note and in instructions to patient .   Importance of ongoing  rx advice and follow  Up  Sing up for mmy char to help with communication    Patient Instructions  You need to get in with another endocrinologist and fu as we discussed for your health .   Increase  Work your way up  To insulin to 30 units  Per day .   I advise adding a medicaiton such as ozempic  Injected once a week  Or  jardiance   farxiga   Pills  In addition  To help with control .   These should not cause diarrhea   Some nausea and weight loss    Are side ffects .     plendil   Er is not  Tested at 20 mg per day     Usually  Add antoher medication  Instead of increasing dose and  Insurance may not pay for   Out of range   Dosing .   carvedilol t  . A good add on   Medication.    There is a  Hypertension clinic if needed.   You are due for  Cholesterol monitoring  And cpx  Also .  Advise cpx in 3-4 months or as needed and we can do lab at that time .   Contact us  In interim if  Need help or ?s         Standley Brooking. Panosh M.D.

## 2017-08-12 ENCOUNTER — Encounter: Payer: Self-pay | Admitting: Internal Medicine

## 2017-08-12 ENCOUNTER — Ambulatory Visit: Payer: BLUE CROSS/BLUE SHIELD | Admitting: Internal Medicine

## 2017-08-12 VITALS — BP 142/80 | HR 90 | Temp 98.3°F | Wt 313.4 lb

## 2017-08-12 DIAGNOSIS — E669 Obesity, unspecified: Secondary | ICD-10-CM

## 2017-08-12 DIAGNOSIS — G4733 Obstructive sleep apnea (adult) (pediatric): Secondary | ICD-10-CM | POA: Diagnosis not present

## 2017-08-12 DIAGNOSIS — I10 Essential (primary) hypertension: Secondary | ICD-10-CM | POA: Diagnosis not present

## 2017-08-12 DIAGNOSIS — E1169 Type 2 diabetes mellitus with other specified complication: Secondary | ICD-10-CM | POA: Diagnosis not present

## 2017-08-12 DIAGNOSIS — Z79899 Other long term (current) drug therapy: Secondary | ICD-10-CM | POA: Diagnosis not present

## 2017-08-12 LAB — POCT GLYCOSYLATED HEMOGLOBIN (HGB A1C): Hemoglobin A1C: 9.1

## 2017-08-12 MED ORDER — FELODIPINE ER 10 MG PO TB24: 10.0000 mg | ORAL_TABLET | Freq: Every day | ORAL | 1 refills | Status: DC

## 2017-08-12 MED ORDER — CARVEDILOL 6.25 MG PO TABS: 6.2500 mg | ORAL_TABLET | Freq: Two times a day (BID) | ORAL | 3 refills | Status: DC

## 2017-08-12 NOTE — Patient Instructions (Addendum)
You need to get in with another endocrinologist and fu as we discussed for your health .   Increase  Work your way up  To insulin to 30 units  Per day .   I advise adding a medicaiton such as ozempic  Injected once a week  Or  jardiance   farxiga   Pills  In addition  To help with control .   These should not cause diarrhea   Some nausea and weight loss    Are side ffects .     plendil   Er is not  Tested at 20 mg per day     Usually  Add antoher medication  Instead of increasing dose and  Insurance may not pay for   Out of range   Dosing .   carvedilol t  . A good add on   Medication.   There is a  Hypertension clinic if needed.   You are due for  Cholesterol monitoring  And cpx  Also .  Advise cpx in 3-4 months or as needed and we can do lab at that time .   Contact us  In interim if  Need help or ?s

## 2017-08-14 ENCOUNTER — Encounter: Payer: Self-pay | Admitting: Adult Health

## 2017-08-14 ENCOUNTER — Ambulatory Visit: Payer: BLUE CROSS/BLUE SHIELD | Admitting: Adult Health

## 2017-08-14 DIAGNOSIS — R05 Cough: Secondary | ICD-10-CM

## 2017-08-14 DIAGNOSIS — R053 Chronic cough: Secondary | ICD-10-CM

## 2017-08-14 NOTE — Patient Instructions (Addendum)
Continue on Zyrtec 10mg  At bedtime  .  Delsym 2 tsp Twice daily .  Begin Tessalon Three times a day .  Continue on Protonix 40mg  daily .  Start Pepcid 20mg  At bedtime   Begin Dymista daily .  Refer to allergist.  Follow up with Dr. Isaiah SergeMannam in 4 weeks and As needed   Please contact office for sooner follow up if symptoms do not improve or worsen or seek emergency care

## 2017-08-14 NOTE — Progress Notes (Signed)
_0  ID: [name], female    DOB: 1960/09/19, 57 y.o.   MRN: [id number]  Chief Complaint  Patient presents with  . Follow-up    cough     Referring provider: Burnis Medin, MD  HPI: 57 yo female never smoker seen for sleep consult found to havesevere OSA on sleep study 12/2015 -CPAP intolerant and chronic cough   TEST 12/06/15, AHI 43.6, SaO2 low 55%.  FENO 06/05/16- 17 Chest x-ray 06/02/16- no active cardio pulmonary disease.  PFTs 07/24/16 FVC 2.17(69%) FEV1 1.86 (75%) F/F86 TLC 71% DLCO 77%, DLCO/VA 132%  Allergy test 06/24/16-sensitivity to cockroach, ragweed, dust mite. IgE 170  Echo 07/2017 >Echo is normal , good pump function , mild LVH -no sign of CHF   08/14/2017 Follow up : Cough , Edema  Patient returns for a 3-week follow-up.  Last visit patient was having increased cough congestion and lower extremity edema.  She was treated for bronchitis with doxycycline for 1 week.  Instructed to use Zyrtec Delsym Tessalon for cough control along with Protonix for possible reflux component. Chest x-ray showed no acute process.. She is taking Zyrtec , Protonix but did not take Delsym or tessalon -did not feel it helped  In past  BMP was normal.  CBC was normal.  Echo showed normal EF.  With mild LVH otherwise unremarkable. Since last visit patient says she is not feeling much better. Still coughing non stop .  Albuterol makes cough worse. Has tried Symbicort in past with no help.  She denies chest  Pain, orthopnea, edema or feve.r       Allergies  Allergen Reactions  . Candesartan Cilexetil Other (See Comments)    Reaction to Atacand - possibly drowsiness or diarrhea or nausea  . Codeine Hives, Itching and Swelling    Possible throat swelling - pt does not recall this reaction  . Ibuprofen Hives  . Telmisartan Other (See Comments)    Reaction to Micardis - possibly drowsiness or diarrhea or nausea  . Zithromax [Azithromycin] Diarrhea          Immunization History  Administered Date(s) Administered  . Influenza,inj,Quad PF,6+ Mos 03/15/2017  . Td 08/30/1996    Past Medical History:  Diagnosis Date  . Allergic rhinitis   . Diabetes mellitus   . Hyperlipidemia   . Hypertension    x many years (since birht of her son at 63) Had workup with renal artery angiogram that showed no evidence for fibromuscular dysplasia ( no significant renal artery stenosis.) ECHO (8/11) EF 60-65%, mild LVH, no regional WMAs.  . Low back pain   . Morbid obesity ([place])   . OSA (obstructive sleep apnea) 12/28/2015    Tobacco History: Social History   Tobacco Use  Smoking Status Never Smoker  Smokeless Tobacco Never Used   Counseling given: Not Answered   Outpatient Encounter Medications as of 08/14/2017  Medication Sig  . albuterol (PROAIR HFA) 108 (90 Base) MCG/ACT inhaler Inhale 1-2 puffs into the lungs 2 (two) times daily as needed for wheezing or shortness of breath.  . Ascorbic Acid (VITAMIN C PO) Take 1 tablet by mouth at bedtime.  . Azelastine-Fluticasone (DYMISTA) 137-50 MCG/ACT SUSP Place 2 sprays into the nose daily.  . benzonatate (TESSALON) 200 MG capsule Take 1 capsule (200 mg total) by mouth 3 (three) times daily as needed for cough.  . carvedilol (COREG) 6.25 MG tablet Take 1 tablet (6.25 mg total) by mouth 2 (two) times daily with a meal.  For hhypertension  . felodipine (PLENDIL) 10 MG 24 hr tablet Take 1 tablet (10 mg total) by mouth daily.  [name] glucose blood (ACCU-CHEK GUIDE) test strip Use as instructed to check sugar 3 times daily  . Insulin Glargine (BASAGLAR KWIKPEN) 100 UNIT/ML SOPN Inject 0.2 mLs (20 Units total) into the skin at bedtime.  . Insulin Pen Needle (BD PEN NEEDLE NANO U/F) 32G X 4 MM MISC Inject 16 units daily at bedtime  . KLOR-CON M20 20 MEQ tablet Take 1 tablet (20 mEq total) by mouth daily.  . Lancets Misc. (ACCU-CHEK MULTICLIX LANCET DEV) KIT Use as instructed to check sugar 3 times daily  . metFORMIN  (GLUCOPHAGE-XR) 500 MG 24 hr tablet 1 tab dinner x4 days,1 tab breakfast 1 tab dinner x4 days,1 tab breakfast 2 tabs dinner x4 days,  2 tabs breakfast and 2 tabs dinner  . montelukast (SINGULAIR) 10 MG tablet Take 10 mg by mouth at bedtime.  . Multiple Vitamin (MULTIVITAMIN WITH MINERALS) TABS tablet Take 1 tablet by mouth at bedtime. One a Day 50 plus  . ONE TOUCH LANCETS MISC Use to check 3 times daily  . pantoprazole (PROTONIX) 40 MG tablet TAKE 1 TABLET (40 MG TOTAL) BY MOUTH DAILY.  [name] potassium chloride (KLOR-CON) 20 MEQ packet Take 40 mEq by mouth daily.  . [DISCONTINUED] doxycycline (VIBRA-TABS) 100 MG tablet Take 1 tablet (100 mg total) by mouth 2 (two) times daily. (Patient not taking: Reported on 08/14/2017)  . [DISCONTINUED] potassium chloride (KLOR-CON) 20 MEQ packet Take 40 mEq by mouth daily.   No facility-administered encounter medications on file as of 08/14/2017.      Review of Systems  Constitutional:   No  weight loss, night sweats,  Fevers, chills, fatigue, or  lassitude.  HEENT:   No headaches,  Difficulty swallowing,  Tooth/dental problems, or  Sore throat,                No sneezing, itching, ear ache,  +nasal congestion, post nasal drip,   CV:  No chest pain,  Orthopnea, PND, swelling in lower extremities, anasarca, dizziness, palpitations, syncope.   GI  No heartburn, indigestion, abdominal pain, nausea, vomiting, diarrhea, change in bowel habits, loss of appetite, bloody stools.   Resp:    No chest wall deformity  Skin: no rash or lesions.  GU: no dysuria, change in color of urine, no urgency or frequency.  No flank pain, no hematuria   MS:  No joint pain or swelling.  No decreased range of motion.  No back pain.    Physical Exam  BP 122/80 (BP Location: Left Arm, Cuff Size: Large)   Pulse 92   Wt (!) 314 lb 3.2 oz (142.5 kg)   SpO2 91%   BMI 49.21 kg/m   GEN: A/Ox3; pleasant , NAD, obese    HEENT:  [place]/AT,  EACs-clear, TMs-wnl, NOSE-clear drainage  , THROAT-clear, no lesions, no postnasal drip or exudate noted.   NECK:  Supple w/ fair ROM; no JVD; normal carotid impulses w/o bruits; no thyromegaly or nodules palpated; no lymphadenopathy.    RESP  Clear  P & A; w/o, wheezes/ rales/ or rhonchi. no accessory muscle use, no dullness to percussion  CARD:  RRR, no m/r/g, 1+ peripheral edema, pulses intact, no cyanosis or clubbing.  GI:   Soft & nt; nml bowel sounds; no organomegaly or masses detected.   Musco: Warm bil, no deformities or joint swelling noted.   Neuro: alert, no focal deficits noted.  Skin: Warm, no lesions or rashes    Lab Results:  CBC    Component Value Date/Time   WBC 5.9 07/25/2017 1712   RBC 4.58 07/25/2017 1712   HGB 13.9 07/25/2017 1712   HCT 42.6 07/25/2017 1712   PLT 349.0 07/25/2017 1712   MCV 93.2 07/25/2017 1712   MCH 31.2 06/02/2016 1457   MCHC 32.7 07/25/2017 1712   RDW 13.1 07/25/2017 1712   LYMPHSABS 2.5 07/25/2017 1712   MONOABS 0.5 07/25/2017 1712   EOSABS 0.1 07/25/2017 1712   BASOSABS 0.1 07/25/2017 1712    BMET    Component Value Date/Time   NA 138 07/25/2017 1712   K 3.5 07/25/2017 1712   CL 99 07/25/2017 1712   CO2 30 07/25/2017 1712   GLUCOSE 229 (H) 07/25/2017 1712   BUN 18 07/25/2017 1712   CREATININE 0.94 07/25/2017 1712   CREATININE 0.86 10/12/2015 1010   CALCIUM 9.1 07/25/2017 1712   GFRNONAA >60 06/02/2016 1457   GFRNONAA 78 10/12/2015 1010   GFRAA >60 06/02/2016 1457   GFRAA >89 10/12/2015 1010    BNP    Component Value Date/Time   BNP 22.2 06/02/2016 1458    ProBNP    Component Value Date/Time   PROBNP 8.0 07/25/2017 1712    Imaging: Dg Chest 2 View  Result Date: 07/25/2017 CLINICAL DATA:  Persistent cough with congestion and shortness of breath 3 months worse recently. EXAM: CHEST - 2 VIEW COMPARISON:  06/02/2016 FINDINGS: Lungs are adequately inflated without focal consolidation or effusion. There is mild stable cardiomegaly. Mild degenerate  change of the spine. IMPRESSION: No active cardiopulmonary disease. Mild stable cardiomegaly. Electronically Signed   By: Marin Olp M.D.   On: 07/25/2017 17:28     Assessment & Plan:   No problem-specific Assessment & Plan notes found for this encounter.     Rexene Edison, NP 08/14/2017

## 2017-08-15 MED ORDER — AZELASTINE-FLUTICASONE 137-50 MCG/ACT NA SUSP: 2.0000 | Freq: Every day | NASAL | 5 refills | Status: DC

## 2017-08-15 NOTE — Assessment & Plan Note (Signed)
Chronic cough w/ slow to resolve flare after ? Bronchitis . She has been coughing for >3 months this flare . Had similar episode in past with improvement with tx aimed at GERD/AR treatment /prevention .  Workup is unrevealing with nml labs bmp, cbc, echo and CXR  No improvement with steroids , albuterol or symibocrt in past.  Will try to aim tx at AR/GERD and aggressive cough control  Refer to Allergist as previous allergy testing with elefvated IgE at 170 .   Plan  Patient Instructions  Continue on Zyrtec 10mg  At bedtime  .  Delsym 2 tsp Twice daily .  Begin Tessalon Three times a day .  Continue on Protonix 40mg  daily .  Start Pepcid 20mg  At bedtime   Begin Dymista daily .  Refer to allergist.  Follow up with Dr. Isaiah SergeMannam in 4 weeks and As needed   Please contact office for sooner follow up if symptoms do not improve or worsen or seek emergency care

## 2017-08-17 ENCOUNTER — Other Ambulatory Visit: Payer: Self-pay | Admitting: Internal Medicine

## 2017-08-21 ENCOUNTER — Telehealth: Payer: Self-pay | Admitting: Internal Medicine

## 2017-08-21 ENCOUNTER — Telehealth: Payer: Self-pay | Admitting: Pulmonary Disease

## 2017-08-21 MED ORDER — CHLORPHENIRAMINE MALEATE 4 MG PO TABS: 4.0000 mg | ORAL_TABLET | Freq: Two times a day (BID) | ORAL | 0 refills | Status: DC | PRN

## 2017-08-21 MED ORDER — PREDNISONE 10 MG PO TABS: 10.0000 mg | ORAL_TABLET | Freq: Every day | ORAL | 0 refills | Status: DC

## 2017-08-21 NOTE — Addendum Note (Signed)
Addended by: Cornell BarmanWHITTAKER, KELLI M on: 08/21/2017 04:32 PM   Modules accepted: Orders

## 2017-08-21 NOTE — Telephone Encounter (Signed)
Says pharmacy didn't have Dr Mallie DartingManam's script for prednisone today, although it shows on her med list.  I called [facility], confirmed they didn't get the script, and ordered prednisone 10 mg, # 30, 4x3, 3x3, 2x3, then one daily til gone.

## 2017-08-21 NOTE — Telephone Encounter (Signed)
Try chlorpheniramine 8 mg tid. I think that worked for her in past. Offer prednisone taper starting at 40 mg reduce dose by 10 mg every 3 days Check with TP if she advises anything else as I have not seen the pt in over a year  Chilton GreathousePraveen Chantia Amalfitano MD [facility] If no answer or after 3pm call: [phone] 08/21/2017, 3:20 PM

## 2017-08-21 NOTE — Telephone Encounter (Signed)
Called and spoke with patient regarding Dr. Isaiah SergeMannam and TP recommendations Spoke with TP she is fine with Dr. Isaiah SergeMannam recommendation below; nothing to add TP advised pt to stop taking Tessalon 200 mg effective today 08/21/17 Placed order for chlorpheniramine 8mg  TID and prednisone taper starting at 40mg  reduce dose by 10mg  every 3 days [facility] [address] Patient verbalized understanding, and had no further concerns nor questions.

## 2017-08-21 NOTE — Telephone Encounter (Signed)
Called and spoke with patient regarding allergies and chronic cough Pt reports wheezing, SOB with exertion, productive cough-yellow, not breathing well Pt advised her house is over 55 years old, allergic to duct and pollen really bad Pt advised cough worsened in last 7 days since last seen by TP in office on 08/14/17 Pt states the tessalong 200mg  makes her sleepy and lethargic and it is not working for her  Tp recommendations on last OV on 08/14/17 Continue on Zyrtec 10mg  At bedtime  .  Delsym 2 tsp Twice daily .  Begin Tessalon Three times a day .  Continue on Protonix 40mg  daily .  Start Pepcid 20mg  At bedtime   Begin Dymista daily .  Refer to allergist.  Follow up with Dr. Isaiah SergeMannam in 4 weeks and As needed   Please contact office for sooner follow up if symptoms do not improve or worsen or seek emergency care   Dr. Isaiah SergeMannam please advise

## 2017-09-12 ENCOUNTER — Ambulatory Visit: Payer: BLUE CROSS/BLUE SHIELD | Admitting: Pulmonary Disease

## 2017-09-12 ENCOUNTER — Telehealth: Payer: Self-pay | Admitting: Pulmonary Disease

## 2017-09-12 NOTE — Telephone Encounter (Signed)
Brought pt back to a room from a lobby to address her concerns regarding prednisone script.  Pt showed me the script she had picked up from the pharmacy showing that there was a quantity of 60tablets in the prescription.  When looking further, it was a  tablet that was called in by Dr. Maple Hudson instead of a  tablet. The instructions on the prescription were for pt to take 8tabs x3days, 6tabs x3days, 4tabs x3days, then 2 tabs until gone.  Stated to pt since it was a  tablet that was called in instead of the  tablet, she is just having to double the amount of tablets she is to take which would be the same equivalence as the  tablet.  Instructions for the  tablet were for pt to take 4tabs x3days, 3tabs x3days, 2tabs x3days, then 1tab until gone so with the  tablet, to get the full mg to add up like the  tablet, the is just having to double.  After explaining this to pt, pt expressed understanding. Nothing further needed at this time.

## 2017-09-26 ENCOUNTER — Telehealth: Payer: Self-pay | Admitting: Internal Medicine

## 2017-09-26 NOTE — Telephone Encounter (Signed)
Copied from CRM [id number]. Topic: Quick Communication - See Telephone Encounter >> Sep 26, 2017  2:39 PM Oneal Grout wrote: CRM for notification. See Telephone encounter for: 09/26/17. Patient has not been taking insulin due to diarrhea. Has been exercising. Patient is scheduled for August, does she need to be seen sooner? Has only been taking 1 BP meds, not sure of name. Will come in for consult, but would like to try and loose weight with exercise.

## 2017-09-26 NOTE — Telephone Encounter (Signed)
Insulin should not  Cause the diarrhea   ( Like metformin can ) ways  .      I advise go back on the insulin   And check her bgs   Daily and record      If her diabetes is very out of control she will feel badly in many ways    She should see an  endocrinologist    When Is her appt with them  ?  If needed we can see her in a month with blood sugar readings for me to review in the interim

## 2017-09-26 NOTE — Telephone Encounter (Signed)
Instructions   Return in about 3 months (around 11/11/2017) for preventive /cpx and medications.   You need to get in with another endocrinologist and fu as we discussed for your health .   Increase  Work your way up  To insulin to 30 units  Per day .   I advise adding a medicaiton such as ozempic  Injected once a week  Or  jardiance   farxiga   Pills  In addition  To help with control .   These should not cause diarrhea   Some nausea and weight loss    Are side ffects .   plendil   Er is not  Tested at 20 mg per day     Usually  Add antoher medication  Instead of increasing dose and  Insurance may not pay for   Out of range   Dosing .   carvedilol t  . A good add on   Medication.   There is a  Hypertension clinic if needed.   You are due for  Cholesterol monitoring  And cpx  Also .  Advise cpx in 3-4 months or as needed and we can do lab at that time .   Contact us  In interim if  Need help or ?s         Pt is having diarrhea and has not been taking the Insulin because she feels this is what is causing it.  Wants to know if she needs to be seen sooner than her schedule OV in 12/09/2017 or if she can wait until then even though she is not taking her meds as directed.   Please advise Dr Fabian Sharp, thanks.

## 2017-10-01 NOTE — Telephone Encounter (Signed)
Since starting Insulin she has been having side effects Weight gain/bloating Diarrhea up to 4x a day Everything she eats upsets her stomach Unable to work with having to run to the bathroom all day long.   Pt has not started new BP meds yet bc she was trying to figure out if issues were coming from Insulin Pt states that Metformin used to cause similar side effects.  Pt has been walking and exercising and since doing this her blood sugars have started to get back to a good range.  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   Pt does not have an Endocrinologist yet, has not been able to set up an appt yet Pt is wanting to know if Dr Fabian Sharp can manage for atleast 6 months.  Pt states that when she stopped the Insulin her diarrhea stopped as well. Pt states that she knows her body and states that she feels that her symptoms we coming 100% from the insulin.   Pt states that she does not want to be on medication daily and check blood sugars daily for the rest of her life.  Pt states that she is going to make some major changes to her health and might attempt to restart the insulin again if Dr Fabian Sharp says she has to.   Pt is going to keep a log of her blood sugars and may restart the Insulin at a lower dose to see if tolerated better.   Pt is scheduled for 12/09/17 with Dr Fabian Sharp.   Please advise Dr Fabian Sharp, thanks.

## 2017-10-03 NOTE — Telephone Encounter (Signed)
Not sure    what else to advise except  Getting in with endocrinologist     Since she cant take insulin or metformin and perhaps a differ net  kind of insulin?     There are many meds to try such as ozempic injectable weekly but  no guarantees  .    Do you want [place] to try a different injectable  Medication?     We may have samples of  ozempic    To try.  If she want to do this

## 2017-10-07 NOTE — Telephone Encounter (Addendum)
LM on home # LM on cell # detailed letting patient know of recommendations and asking her to return our call and let us know what she wants to do. Will await call back. CRM created

## 2017-10-08 NOTE — Telephone Encounter (Signed)
PT states instead of getting samples of a different insulin  to try, she will continue to use the insulin she has and if it makes her sick  she will talk to Dr Fabian SharpPanosh about changing it at her next appointment.

## 2017-10-09 NOTE — Telephone Encounter (Signed)
Noted.  Will send to Dr Fabian SharpPanosh to make aware.  Nothing further needed.

## 2017-10-28 ENCOUNTER — Ambulatory Visit: Payer: BLUE CROSS/BLUE SHIELD | Admitting: Pulmonary Disease

## 2017-10-28 ENCOUNTER — Encounter: Payer: Self-pay | Admitting: Pulmonary Disease

## 2017-10-28 VITALS — BP 138/78 | HR 91 | Ht 67.0 in | Wt 315.0 lb

## 2017-10-28 DIAGNOSIS — R05 Cough: Secondary | ICD-10-CM | POA: Diagnosis not present

## 2017-10-28 DIAGNOSIS — R059 Cough, unspecified: Secondary | ICD-10-CM

## 2017-10-28 LAB — NITRIC OXIDE: NITRIC OXIDE: 13

## 2017-10-28 MED ORDER — MONTELUKAST SODIUM 10 MG PO TABS: 10.0000 mg | ORAL_TABLET | Freq: Every day | ORAL | 2 refills | Status: DC

## 2017-10-28 NOTE — Progress Notes (Signed)
[name]    [id number]    [birth year]  Primary Care Physician:Panosh, Standley Brooking, MD  Referring Physician: Burnis Medin, [place], [place] [address]  Chief complaint:   Follow up for upper airway cough syndrome. OSA  HPI: 57 year old with past medical history of severe untreated sleep apnea, diabetes, hypertension, hyperlipidemia.  Originally seen in 2018 for upper airway cough after an episode of bronchitis.  This responded to treatment with antihistamine, nasal spray and antiacid medication.  She did notice episode of bronchitis in early 2019 which has been slow to resolve.  Seen by nurse practitioner in clinic a couple of times with slow to resolve flare.  Work-up including labs, echocardiogram, chest x-ray has been unrevealing.  She was on Symbicort at some point but stopped as it was not helping with her symptoms.  She has history of sleep apnea and started on CPAP at 16 in 2017. She stopped using it since the mask was suffocating her. The CPAP machine was taken back by the insurance company because she was not using it. She wants to retry the CPAP with a different interface now but was told that she may need a new sleep study.  Interim History: States that cough is slightly better but still has daily symptoms, nonproductive in nature.  No fevers, chills She has been given a prescription for prednisone but did not take it due to high blood sugars.  Outpatient Encounter Medications as of 10/28/2017  Medication Sig  . albuterol (PROAIR HFA) 108 (90 Base) MCG/ACT inhaler Inhale 1-2 puffs into the lungs 2 (two) times daily as needed for wheezing or shortness of breath.  . Ascorbic Acid (VITAMIN C PO) Take 1 tablet by mouth at bedtime.  . Azelastine-Fluticasone (DYMISTA) 137-50 MCG/ACT SUSP Place 2 sprays into the nose daily.  . carvedilol (COREG) 6.25 MG tablet Take 1 tablet (6.25 mg total) by mouth 2 (two) times daily with a meal. For  hhypertension  . chlorpheniramine (CHLOR-TRIMETON) 4 MG tablet Take 1 tablet (4 mg total) by mouth 2 (two) times daily as needed for allergies.  . felodipine (PLENDIL) 10 MG 24 hr tablet Take 1 tablet (10 mg total) by mouth daily.  [name] glucose blood (ACCU-CHEK GUIDE) test strip Use as instructed to check sugar 3 times daily  . Insulin Glargine (BASAGLAR KWIKPEN) 100 UNIT/ML SOPN Inject 0.2 mLs (20 Units total) into the skin at bedtime.  . Insulin Pen Needle (BD PEN NEEDLE NANO U/F) 32G X 4 MM MISC Inject 16 units daily at bedtime  . KLOR-CON M20 20 MEQ tablet TAKE 1 TABLET BY MOUTH EVERY DAY  . Lancets Misc. (ACCU-CHEK MULTICLIX LANCET DEV) KIT Use as instructed to check sugar 3 times daily  . Multiple Vitamin (MULTIVITAMIN WITH MINERALS) TABS tablet Take 1 tablet by mouth at bedtime. One a Day 50 plus  . ONE TOUCH LANCETS MISC Use to check 3 times daily  . pantoprazole (PROTONIX) 40 MG tablet TAKE 1 TABLET (40 MG TOTAL) BY MOUTH DAILY.  [name] potassium chloride (KLOR-CON) 20 MEQ packet Take 40 mEq by mouth daily.  . [DISCONTINUED] benzonatate (TESSALON) 200 MG capsule Take 1 capsule (200 mg total) by mouth 3 (three) times daily as needed for cough.  . [DISCONTINUED] metFORMIN (GLUCOPHAGE-XR) 500 MG 24 hr tablet 1 tab dinner x4 days,1 tab breakfast 1 tab dinner x4 days,1 tab breakfast 2 tabs dinner x4 days,  2 tabs breakfast and 2 tabs  dinner  . [DISCONTINUED] montelukast (SINGULAIR) 10 MG tablet Take 10 mg by mouth at bedtime.  . [DISCONTINUED] predniSONE (DELTASONE) 10 MG tablet Take 1 tablet (10 mg total) by mouth daily with breakfast. Take 4 tabs for 3 days, 3 tabs for 3 days, 2 tabs for 3 days, 1 tab for 3 days then stop.,   No facility-administered encounter medications on file as of 10/28/2017.     Allergies as of 10/28/2017 - Review Complete 08/14/2017  Allergen Reaction Noted  . Candesartan cilexetil Other (See Comments)   . Codeine Hives, Itching, and Swelling 06/02/2012  . Ibuprofen Hives    . Telmisartan Other (See Comments)   . Zithromax [azithromycin] Diarrhea 06/02/2012    Past Medical History:  Diagnosis Date  . Allergic rhinitis   . Diabetes mellitus   . Hyperlipidemia   . Hypertension    x many years (since birht of her son at 77) Had workup with renal artery angiogram that showed no evidence for fibromuscular dysplasia ( no significant renal artery stenosis.) ECHO (8/11) EF 60-65%, mild LVH, no regional WMAs.  . Low back pain   . Morbid obesity ([place])   . OSA (obstructive sleep apnea) 12/28/2015    Past Surgical History:  Procedure Laterality Date  . ABDOMINAL HYSTERECTOMY     partial for fibroids  . TUBAL LIGATION      Family History  Problem Relation Age of Onset  . Cancer Mother   . Hypertension Mother   . Other Father        died in [place] History   Socioeconomic History  . Marital status: Married    Spouse name: Not on file  . Number of children: Not on file  . Years of education: Not on file  . Highest education level: Not on file  Occupational History  . Not on file  Social Needs  . Financial resource strain: Not on file  . Food insecurity:    Worry: Not on file    Inability: Not on file  . Transportation needs:    Medical: Not on file    Non-medical: Not on file  Tobacco Use  . Smoking status: Never Smoker  . Smokeless tobacco: Never Used  Substance and Sexual Activity  . Alcohol use: No    Alcohol/week: 0.0 oz  . Drug use: No  . Sexual activity: Not on file  Lifestyle  . Physical activity:    Days per week: Not on file    Minutes per session: Not on file  . Stress: Not on file  Relationships  . Social connections:    Talks on phone: Not on file    Gets together: Not on file    Attends religious service: Not on file    Active member of club or organization: Not on file    Attends meetings of clubs or organizations: Not on file    Relationship status: Not on file  . Intimate partner violence:    Fear of current  or ex partner: Not on file    Emotionally abused: Not on file    Physically abused: Not on file    Forced sexual activity: Not on file  Other Topics Concern  . Not on file  Social History Narrative   Married   Regular exercise-yes   HH of 4    28 and 24    No pets   Works at [employer] of systems: Review  of Systems  Constitutional: Negative for fever and chills.  HENT: Negative.   Eyes: Negative for blurred vision.  Respiratory: as per HPI  Cardiovascular: Negative for chest pain and palpitations.  Gastrointestinal: Negative for vomiting, diarrhea, blood per rectum. Genitourinary: Negative for dysuria, urgency, frequency and hematuria.  Musculoskeletal: Negative for myalgias, back pain and joint pain.  Skin: Negative for itching and rash.  Neurological: Negative for dizziness, tremors, focal weakness, seizures and loss of consciousness.  Endo/Heme/Allergies: Negative for environmental allergies.  Psychiatric/Behavioral: Negative for depression, suicidal ideas and hallucinations.  All other systems reviewed and are negative.  Physical Exam: Blood pressure 138/78, pulse 91, height '5\' 7"'  (1.702 m), weight (!) 315 lb (142.9 kg), SpO2 94 %. Gen:      No acute distress HEENT:  EOMI, sclera anicteric Neck:     No masses; no thyromegaly Lungs:    Clear to auscultation bilaterally; normal respiratory effort CV:         Regular rate and rhythm; no murmurs Abd:      + bowel sounds; soft, non-tender; no palpable masses, no distension Ext:    No edema; adequate peripheral perfusion Skin:      Warm and dry; no rash Neuro: alert and oriented x 3 Psych: normal mood and affect  Data Reviewed: FENO  06/05/16- 17 10/28/17- 13  Chest x-ray 06/02/16- no active cardio pulmonary disease Chest x-ray 07/25/17- stable cardiomegaly.  No active cardiopulmonary disease.  PFTs 07/24/16 FVC 2.17 (69%), FEV1 1.86 (75%), F/F 86, TLC 71%, DLCO 77%, DLCO/VA 132%  Allergy test  06/24/16- sensitivity to cockroach, ragweed, dust mite. IgE 170  Sleep study 12/06/15, AHI 43.6, SaO2 low 55%.  Echocardiogram 07/23/2017-Left ventricle: The cavity size was normal. Wall thickness was   increased in a pattern of mild LVH. Systolic function was normal.   The estimated ejection fraction was in the range of 55% to 60%.  Assessment:  Chronic upper airway cough Dyspnea Likely postinfectious from bronchitis with contribution from postnasal drip, acid reflux, untreated OSA Suppression for asthma is low as PFTs did not show any obstruction, FENO is low and she has not responded to inhaler, steroids in the past PFTs do show mild restriction with TLC impairment that corrects for alveolar volume.  I suspect these reductions are related to obesity and body habitus.  There is no evidence of interstitial lung disease.  Encouraged weight loss and exercise.  Continue on chlorpheniramine antihistamine, Dymista nasal spray, cough syrup, Protonix.  Add Singulair. She is concerned about food allergies as certain foods causes increased symptoms.  Will refer her to allergy for further evaluation.  Untreated OSA She is declined getting a repeat sleep study or retry CPAP.[name]  CPAP has been taken away there was a break in service. Asked her to follow-up with Dr. Halford Chessman for further evaluation and consideration for dental device.   Plan/Recommendations: - Continue antacid med, chlorpheniramine, dymista - Start Singulair, allergy referral. - Sleep clinic follow up. - Weight loss exercise  Marshell Garfinkel MD [facility] 10/28/2017, 9:11 AM  CC: Panosh, Standley Brooking, MD

## 2017-10-28 NOTE — Patient Instructions (Signed)
We will schedule you for Dr. Craige CottaSood for a sleep follow-up and evaluate you for dental device Referral to allergy to reassess seasonal allergies and food allergies Continue antihistamine, nasal spray, antiacid medication We will start Singulair 10 mg a day  Follow-up in 3 months.

## 2017-11-02 ENCOUNTER — Other Ambulatory Visit: Payer: Self-pay | Admitting: Internal Medicine

## 2017-11-03 ENCOUNTER — Other Ambulatory Visit: Payer: Self-pay | Admitting: Internal Medicine

## 2017-11-04 ENCOUNTER — Other Ambulatory Visit: Payer: Self-pay | Admitting: Internal Medicine

## 2017-11-12 ENCOUNTER — Encounter: Payer: Self-pay | Admitting: Pediatrics

## 2017-12-09 ENCOUNTER — Ambulatory Visit: Payer: BLUE CROSS/BLUE SHIELD | Admitting: Internal Medicine

## 2017-12-29 NOTE — Progress Notes (Signed)
Chief Complaint  Patient presents with  . Follow-up    discuss weight loss.  she has not been taking the insulin    HPI: Patient  [name]  57 y.o. comes in today for  Fu of man issues  BP didn't start the carvedilol  Yet but has medication DM hasn't seen an new endo yet   Sugars   Every day   avering 180 - 225  Even with .  Insuline  For 6 weeks.  Stopped about 3 mos ago cuase she though didn't make a difference   Insulin    "didnt move her numbers  "  gym and  Eating better  .    Has decided that she needs to take better care of health and get her weight and dm in control  Has had diarrhea for a while even off the metformin   Began a probiotic and went away almost immediately .   Would like to try metformin again .  insrulin refillwas 70 and felt too expensive .  Wants to lose weight and nutrition help and poss support group.  Working a good bit  Hours stressful  osa  Hard to use mask and when didn't use  Insurance took machine away and now  Not sure of options.   Hasn't  had colon  Cancer  screening   Health Maintenance  Topic Date Due  . Hepatitis C Screening  02[birth year]  . PNEUMOCOCCAL POLYSACCHARIDE VACCINE AGE 47-64 HIGH RISK  01/24/1965  . HIV Screening  01/24/1978  . TETANUS/TDAP  08/31/2006  . MAMMOGRAM  01/24/2013  . COLONOSCOPY  01/24/2013  . PAP SMEAR  03/09/2017  . FOOT EXAM  06/26/2017  . URINE MICROALBUMIN  06/26/2017  . INFLUENZA VACCINE  12/04/2017  . OPHTHALMOLOGY EXAM  12/26/2017  . HEMOGLOBIN A1C  02/11/2018     ROS:  GEN/ HEENT: No fever, significant weight changes sweats headaches vision problems hearing changes, CV/ PULM; No chest pain shortness of breath cough, syncope,edema  change in exercise tolerance. GI /GU: No adominal pain, vomiting, change in bowel habits. No blood in the stool. No significant GU symptoms. SKIN/HEME: ,no acute skin rashes suspicious lesions or bleeding. No lymphadenopathy, nodules, masses.  NEURO/ PSYCH:   No neurologic signs such as weakness numbness. No depression anxiety. IMM/ Allergy: No unusual infections.  Allergy .   REST of 12 system review negative except as per HPI   Past Medical History:  Diagnosis Date  . Allergic rhinitis   . Diabetes mellitus   . Hyperlipidemia   . Hypertension    x many years (since birht of her son at 54) Had workup with renal artery angiogram that showed no evidence for fibromuscular dysplasia ( no significant renal artery stenosis.) ECHO (8/11) EF 60-65%, mild LVH, no regional WMAs.  . Low back pain   . Morbid obesity ([place])   . OSA (obstructive sleep apnea) 12/28/2015    Past Surgical History:  Procedure Laterality Date  . ABDOMINAL HYSTERECTOMY     partial for fibroids  . TUBAL LIGATION      Family History  Problem Relation Age of Onset  . Cancer Mother   . Hypertension Mother   . Other Father        died in [place] History   Socioeconomic History  . Marital status: Married    Spouse name: Not on file  . Number of children: Not on file  . Years of education:  Not on file  . Highest education level: Not on file  Occupational History  . Not on file  Social Needs  . Financial resource strain: Not on file  . Food insecurity:    Worry: Not on file    Inability: Not on file  . Transportation needs:    Medical: Not on file    Non-medical: Not on file  Tobacco Use  . Smoking status: Never Smoker  . Smokeless tobacco: Never Used  Substance and Sexual Activity  . Alcohol use: No    Alcohol/week: 0.0 standard drinks  . Drug use: No  . Sexual activity: Not on file  Lifestyle  . Physical activity:    Days per week: Not on file    Minutes per session: Not on file  . Stress: Not on file  Relationships  . Social connections:    Talks on phone: Not on file    Gets together: Not on file    Attends religious service: Not on file    Active member of club or organization: Not on file    Attends meetings of clubs or organizations:  Not on file    Relationship status: Not on file  Other Topics Concern  . Not on file  Social History Narrative   Married   Regular exercise-yes   HH of 4    26 and 24    No pets   Works at [employer] Medications Prior to Visit  Medication Sig Dispense Refill  . albuterol (PROAIR HFA) 108 (90 Base) MCG/ACT inhaler Inhale 1-2 puffs into the lungs 2 (two) times daily as needed for wheezing or shortness of breath.    . Ascorbic Acid (VITAMIN C PO) Take 1 tablet by mouth at bedtime.    . Azelastine-Fluticasone (DYMISTA) 137-50 MCG/ACT SUSP Place 2 sprays into the nose daily. 23 g 5  . felodipine (PLENDIL) 10 MG 24 hr tablet Take 1 tablet (10 mg total) by mouth daily. 90 tablet 1  . glucose blood (ACCU-CHEK GUIDE) test strip Use as instructed to check sugar 3 times daily 300 each 1  . KLOR-CON M20 20 MEQ tablet TAKE 1 TABLET BY MOUTH EVERY DAY 90 tablet 2  . Multiple Vitamin (MULTIVITAMIN WITH MINERALS) TABS tablet Take 1 tablet by mouth at bedtime. One a Day 50 plus    . ONE TOUCH LANCETS MISC Use to check 3 times daily 300 each 5  . pantoprazole (PROTONIX) 40 MG tablet TAKE 1 TABLET (40 MG TOTAL) BY MOUTH DAILY. 90 tablet 0  . potassium chloride (KLOR-CON) 20 MEQ packet Take 40 mEq by mouth daily.    . carvedilol (COREG) 6.25 MG tablet TAKE 1 TABLET (6.25 MG TOTAL) BY MOUTH 2 (TWO) TIMES DAILY WITH A MEAL. FOR HYPERTENSION (Patient not taking: Reported on 12/30/2017) 180 tablet 0  . chlorpheniramine (CHLOR-TRIMETON) 4 MG tablet Take 1 tablet (4 mg total) by mouth 2 (two) times daily as needed for allergies. (Patient not taking: Reported on 12/30/2017) 14 tablet 0  . Insulin Glargine (BASAGLAR KWIKPEN) 100 UNIT/ML SOPN Inject 0.2 mLs (20 Units total) into the skin at bedtime. (Patient not taking: Reported on 12/30/2017) 15 pen 0  . Insulin Pen Needle (BD PEN NEEDLE NANO U/F) 32G X 4 MM MISC Inject 16 units daily at bedtime (Patient not taking: Reported on 12/30/2017) 100  each 5  . Lancets Misc. (ACCU-CHEK MULTICLIX LANCET DEV) KIT Use as instructed to check sugar 3 times daily (  Patient not taking: Reported on 12/30/2017) 1 each prn  . montelukast (SINGULAIR) 10 MG tablet Take 1 tablet (10 mg total) by mouth at bedtime. (Patient not taking: Reported on 12/30/2017) 30 tablet 2   No facility-administered medications prior to visit.      EXAM:  BP (!) 146/86 (BP Location: Right Arm, Patient Position: Sitting, Cuff Size: Large)   Pulse 86   Temp 98.1 F (36.7 C) (Oral)   Wt (!) 313 lb 2 oz (142 kg)   SpO2 98%   BMI 49.04 kg/m   Body mass index is 49.04 kg/m. Wt Readings from Last 3 Encounters:  12/30/17 (!) 313 lb 2 oz (142 kg)  10/28/17 (!) 315 lb (142.9 kg)  08/14/17 (!) 314 lb 3.2 oz (142.5 kg)    Physical Exam: Vital signs reviewed SWF:UXNA is a well-developed well-nourished alert cooperative    who appearsr stated age in no acute distress.  HEENT: normocephalic atraumatic ,CHEST/PULM:  Clear to auscultation and percussion breath sounds equal no wheeze , rales or rhonchi.s. . CV: PMI is nondisplaced, S1 S2 no gallops, murmurs, rubs. Peripheral pulses are present.No JVD .  ABDOMEN: Bowel sounds normal nontender  No guard or rebound, no hepato splenomegal no CVA tenderness.  No hernia. Extremtities:  No clubbing cyanosis or  Puffy feet   Bilaterally  edema, no acute joint swelling or redness no focal atrophy NEURO: grossly nl SKIN: No acute rashes normal turgor, color, no bruising or petechiae. PSYCH: Oriented, good eye contact, no obvious depression anxiety, cognition and judgment appear normal.   Lab Results  Component Value Date   WBC 5.9 07/25/2017   HGB 13.9 07/25/2017   HCT 42.6 07/25/2017   PLT 349.0 07/25/2017   GLUCOSE 235 (H) 12/30/2017   CHOL 195 12/30/2017   TRIG 123.0 12/30/2017   HDL 46.60 12/30/2017   LDLCALC 124 (H) 12/30/2017   ALT 17 12/30/2017   AST 13 12/30/2017   NA 135 12/30/2017   K 3.5 12/30/2017   CL 93 (L)  12/30/2017   CREATININE 1.05 12/30/2017   BUN 18 12/30/2017   CO2 33 (H) 12/30/2017   TSH 1.29 12/30/2017   INR 1.0 RATIO 07/08/2006   HGBA1C 10.5 (H) 12/30/2017   MICROALBUR 26.4 (H) 06/26/2016    BP Readings from Last 3 Encounters:  12/30/17 (!) 146/86  10/28/17 138/78  08/14/17 122/80    ASSESSMENT AND PLAN:  Discussed the following assessment and plan:  Morbid obesity ([place]) - Plan: Amb ref to Medical Nutrition Therapy-MNT  Medication management - Plan: Lipid panel, Hemoglobin T5T, Basic metabolic panel, TSH, Hepatic function panel  Essential hypertension - Plan: Lipid panel, Hemoglobin D3U, Basic metabolic panel, TSH, Hepatic function panel  Diabetes mellitus type 2 in obese ([place]) - see text i nbetween endo  out of control more willing to try medicaion now   but went off the insulin as thought not that helpful and expenseive.  - Plan: Lipid panel, Hemoglobin K0U, Basic metabolic panel, TSH, Hepatic function panel, Amb ref to Medical Nutrition Therapy-MNT  OSA (obstructive sleep apnea)  Colon cancer screening Multiple issues agree with wegiht loss and disc  Reality of having to take med   diareeha is better on probiotics but  Still needs colon screening   Will re ref to GI  Patient Care Team: Shyan Scalisi, Standley Brooking, MD as PCP - General Druscilla Brownie, MD (Dermatology) Glendale Chard, MD as Attending Physician (Internal Medicine) Patient Instructions  Metformin 500 mg   To try  and increase .    As tolerated   To 2 per day   After 2-3 week. s   Will do a referral to  Gi about the diarrhea and  Colon  cancer screening.  You will be contactted about appt and  You can change the time  As needed,   jardiance and farxiga can help weight loss diabetes control and sometimes lower BP.  Look into this there are  Coupons on line for  Cost reduction   GET appt with endocrinologist   To also help  Look into weight watcher for diabetes .  Plan also.  Will be contacted about a  nutrition referral  .   Plan ROV in  3-6 months or as needed  To help coordinate care .   Get flu vaccine in the fall               Kemia Wendel K. Lyra Alaimo M.D.

## 2017-12-30 ENCOUNTER — Encounter: Payer: Self-pay | Admitting: Internal Medicine

## 2017-12-30 ENCOUNTER — Ambulatory Visit: Payer: BLUE CROSS/BLUE SHIELD | Admitting: Internal Medicine

## 2017-12-30 DIAGNOSIS — R197 Diarrhea, unspecified: Secondary | ICD-10-CM

## 2017-12-30 DIAGNOSIS — Z1211 Encounter for screening for malignant neoplasm of colon: Secondary | ICD-10-CM

## 2017-12-30 DIAGNOSIS — G4733 Obstructive sleep apnea (adult) (pediatric): Secondary | ICD-10-CM

## 2017-12-30 DIAGNOSIS — E1169 Type 2 diabetes mellitus with other specified complication: Secondary | ICD-10-CM

## 2017-12-30 DIAGNOSIS — E669 Obesity, unspecified: Secondary | ICD-10-CM

## 2017-12-30 DIAGNOSIS — I10 Essential (primary) hypertension: Secondary | ICD-10-CM

## 2017-12-30 DIAGNOSIS — Z79899 Other long term (current) drug therapy: Secondary | ICD-10-CM

## 2017-12-30 LAB — TSH: TSH: 1.29 u[IU]/mL (ref 0.35–4.50)

## 2017-12-30 LAB — BASIC METABOLIC PANEL
BUN: 18 mg/dL (ref 6–23)
CO2: 33 mEq/L — ABNORMAL HIGH (ref 19–32)
Calcium: 9.5 mg/dL (ref 8.4–10.5)
Chloride: 93 mEq/L — ABNORMAL LOW (ref 96–112)
Creatinine, Ser: 1.05 mg/dL (ref 0.40–1.20)
GFR: 69.99 mL/min (ref >60.00)
Glucose, Bld: 235 mg/dL — ABNORMAL HIGH (ref 70–99)
Potassium: 3.5 mEq/L (ref 3.5–5.1)
SODIUM: 135 meq/L (ref 135–145)

## 2017-12-30 LAB — LIPID PANEL
CHOL/HDL RATIO: 4
CHOLESTEROL: 195 mg/dL (ref 0–200)
HDL: 46.6 mg/dL (ref >39.00)
LDL Cholesterol: 124 mg/dL — ABNORMAL HIGH (ref 0–99)
NonHDL: 148.7
Triglycerides: 123 mg/dL (ref 0.0–149.0)
VLDL: 24.6 mg/dL (ref 0.0–40.0)

## 2017-12-30 LAB — HEPATIC FUNCTION PANEL
ALT: 17 U/L (ref 0–35)
AST: 13 U/L (ref 0–37)
Albumin: 3.8 g/dL (ref 3.5–5.2)
Alkaline Phosphatase: 100 U/L (ref 39–117)
BILIRUBIN DIRECT: 0.1 mg/dL (ref 0.0–0.3)
BILIRUBIN TOTAL: 0.5 mg/dL (ref 0.2–1.2)
Total Protein: 7.5 g/dL (ref 6.0–8.3)

## 2017-12-30 LAB — HEMOGLOBIN A1C: HEMOGLOBIN A1C: 10.5 % — AB (ref 4.6–6.5)

## 2017-12-30 MED ORDER — METFORMIN HCL ER 500 MG PO TB24: 500.0000 mg | ORAL_TABLET | Freq: Every day | ORAL | 5 refills | Status: DC

## 2017-12-30 NOTE — Patient Instructions (Addendum)
Metformin 500 mg   To try and increase .    As tolerated   To 2 per day   After 2-3 week. s   Will do a referral to  Gi about the diarrhea and  Colon  cancer screening.  You will be contactted about appt and  You can change the time  As needed,   jardiance and farxiga can help weight loss diabetes control and sometimes lower BP.  Look into this there are  Coupons on line for  Cost reduction   GET appt with endocrinologist   To also help  Look into weight watcher for diabetes .  Plan also.  Will be contacted about a nutrition referral  .   Plan ROV in  3-6 months or as needed  To help coordinate care .   Get flu vaccine in the fall

## 2018-01-01 ENCOUNTER — Telehealth: Payer: Self-pay | Admitting: Pulmonary Disease

## 2018-01-01 NOTE — Telephone Encounter (Signed)
Spoke with pt. She meant to call Dr. Rosezella FloridaPanosh's office. She apologized for the call. Nothing further was needed.

## 2018-01-15 ENCOUNTER — Encounter: Payer: Self-pay | Admitting: Internal Medicine

## 2018-01-20 ENCOUNTER — Other Ambulatory Visit: Payer: Self-pay | Admitting: Internal Medicine

## 2018-01-21 ENCOUNTER — Ambulatory Visit: Payer: BLUE CROSS/BLUE SHIELD | Admitting: Pulmonary Disease

## 2018-01-26 ENCOUNTER — Telehealth: Payer: Self-pay | Admitting: Internal Medicine

## 2018-01-26 NOTE — Telephone Encounter (Signed)
Pt aware that she needs to come by the office to sign a release of information and then records will be processed. Can take 7-14 days to process records.   Will send to Margarett to make aware that the patient will be coming by the sign a release.

## 2018-01-26 NOTE — Telephone Encounter (Signed)
Copied from CRM [id number]. Topic: Quick Communication - See Telephone Encounter >> Jan 26, 2018 10:54 AM Jens SomMedley, Jennifer A wrote: CRM for notification. See Telephone encounter for: 01/26/18. Patient is requesting 1years worth Clinicals record to be sent to Dr. [facility] [address] [facility] [place] [address] [phone] [phone]  Patient is also requesting a referral to be sent to Dr. Talmage [facility]  Patients call back [phone]

## 2018-02-18 ENCOUNTER — Encounter: Payer: Self-pay | Admitting: Internal Medicine

## 2018-02-25 NOTE — Telephone Encounter (Addendum)
LM for patient on both home and cell # Detailed message left on cell # Medical record release placed in mail for patient. We need a copy of the patients drivers license attached to release form.  Note attached on form and message left on voicemail letting patient know.

## 2018-02-25 NOTE — Telephone Encounter (Signed)
Patient called and would like to have a release for mailed  To her home address as soon as possible ,please call her at [phone] ok to leave a message

## 2018-03-17 DIAGNOSIS — Z6841 Body Mass Index (BMI) 40.0 and over, adult: Secondary | ICD-10-CM | POA: Diagnosis not present

## 2018-03-17 DIAGNOSIS — Z01419 Encounter for gynecological examination (general) (routine) without abnormal findings: Secondary | ICD-10-CM | POA: Diagnosis not present

## 2018-04-10 ENCOUNTER — Other Ambulatory Visit: Payer: Self-pay | Admitting: Internal Medicine

## 2018-04-16 ENCOUNTER — Other Ambulatory Visit: Payer: Self-pay | Admitting: Internal Medicine

## 2018-04-22 ENCOUNTER — Other Ambulatory Visit: Payer: Self-pay | Admitting: Internal Medicine

## 2018-05-11 ENCOUNTER — Ambulatory Visit: Payer: Self-pay | Admitting: *Deleted

## 2018-05-11 DIAGNOSIS — I10 Essential (primary) hypertension: Secondary | ICD-10-CM

## 2018-05-11 DIAGNOSIS — Z79899 Other long term (current) drug therapy: Secondary | ICD-10-CM

## 2018-05-11 DIAGNOSIS — E669 Obesity, unspecified: Secondary | ICD-10-CM

## 2018-05-11 DIAGNOSIS — Z114 Encounter for screening for human immunodeficiency virus [HIV]: Secondary | ICD-10-CM

## 2018-05-11 DIAGNOSIS — E1169 Type 2 diabetes mellitus with other specified complication: Secondary | ICD-10-CM

## 2018-05-11 DIAGNOSIS — E785 Hyperlipidemia, unspecified: Secondary | ICD-10-CM

## 2018-05-11 DIAGNOSIS — Z1159 Encounter for screening for other viral diseases: Secondary | ICD-10-CM

## 2018-05-11 NOTE — Telephone Encounter (Signed)
Please advise Dr Panosh, thanks.   

## 2018-05-11 NOTE — Telephone Encounter (Signed)
  Reason for Disposition . General information question, no triage required and triager able to answer question  Answer Assessment - Initial Assessment Questions 1. REASON FOR CALL or QUESTION: "What is your reason for calling today?" or "How can I best help you?" or "What question do you have that I can help answer?"     Pt wants to have labs drawn before her appointment this month.  Protocols used: INFORMATION ONLY CALL-A-AH

## 2018-05-11 NOTE — Telephone Encounter (Addendum)
Message from Gerrianne ScaleAngela L Payne sent at 05/11/2018 1:02 PM EST   Pt states that when she started taking the metformin 8 weeks ago she was fine but in a few weeks she start having back pain stating that her kidneys hurt and want to know what she should do sometimes she take one pill a day depending the day    Called patient back regarding taking the Metformin. She has been taking this medication once a day, except for taking it twice for a couple of days.  She thought this medicine was making her kidneys hurt. But now feels like it is a cough that she has that is making her back hurt in the kidney areas.  She wants to know if she can come and have lab work done the day of her appointment on Jan 20 th. And then come back later for office visit. She would be fasting for her labs. She is requesting a call back at this # [phone] and leave her a message regarding this request. [facility] [place].

## 2018-05-11 NOTE — Telephone Encounter (Signed)
Lab orders have been placed

## 2018-05-11 NOTE — Addendum Note (Signed)
Addended byMadelin Headings on: 05/11/2018 05:58 PM   Modules accepted: Orders

## 2018-05-13 NOTE — Telephone Encounter (Signed)
Pt has been notified.

## 2018-05-23 NOTE — Progress Notes (Deleted)
No chief complaint on file.   HPI: [name] 58 y.o. come in for Chronic disease management   She has been encouraged to see spsecialsit but has been dced from  [place] prctive for failure to fu  3 19 Have been  Told  By to be eval for osa for year and errativ fu   DM HT Probably OSA  Morbid obesity ([place]) - Plan: Amb ref to Medical Nutrition Therapy-MNT  Medication management - Plan: Lipid panel, Hemoglobin X9K, Basic metabolic panel, TSH, Hepatic function panel  Essential hypertension - Plan: Lipid panel, Hemoglobin W4O, Basic metabolic panel, TSH, Hepatic function panel  Diabetes mellitus type 2 in obese ([place]) - see text i nbetween endo  out of control more willing to try medicaion now   but went off the insulin as thought not that helpful and expenseive.  - Plan: Lipid panel, Hemoglobin X7D, Basic metabolic panel, TSH, Hepatic function panel, Amb ref to Medical Nutrition Therapy-MNT  OSA (obstructive sleep apnea)  Colon cancer screening Multiple issues agree with wegiht loss and disc  Reality of having to take med   diareeha is better on probiotics but  Still needs colon screening   Will re ref to GI  Patient Care Team: Murline Weigel, Standley Brooking, MD as PCP - General Druscilla Brownie, MD (Dermatology) Glendale Chard, MD as Attending Physician (Internal Medicine) Patient Instructions  Metformin 500 mg   To try and increase .    As tolerated   To 2 per day   After 2-3 week. s   Will do a referral to  Gi about the diarrhea and  Colon  cancer screening.  You will be contactted about appt and  You can change the time  As needed,   jardiance and farxiga can help weight loss diabetes control and sometimes lower BP.  Look into this there are  Coupons on line for  Cost reduction   GET appt with endocrinologist   To also help  Look into weight watcher for diabetes .  Plan also.  Will be contacted about a nutrition referral  .   Plan ROV in  3-6 months or as  needed  To help coordinate care .   Get flu vaccine in the fall               [name] M.D.     Patient Instructions by Burnis Medin, MD at 12/30/2017 9:15 AM  Author: Burnis Medin, MD Author Type: Physician Filed: 12/30/2017 10:17 AM  Note Status: Addendum Cosign: Cosign Not Required Encounter Date: 12/30/2017  Editor: Burnis Medin, MD (Physician)  Prior Versions: 1. Madoline Bhatt, Standley Brooking, MD (Physician) at 12/30/2017 10:16 AM - Addendum   2. Burle Kwan, Standley Brooking, MD (Physician) at 12/30/2017 9:59 AM - Addendum   3. Aren Cherne, Standley Brooking, MD (Physician) at 12/30/2017 9:57 AM - Signed    Metformin 500 mg   To try and increase .    As tolerated   To 2 per day   After 2-3 week. s   Will do a referral to  Gi about the diarrhea and  Colon  cancer screening.  You will be contactted about appt and  You can change the time  As needed,   jardiance and farxiga can help weight loss diabetes control and sometimes lower BP.  Look into this there are  Coupons on line for  Cost reduction   GET appt with endocrinologist   To also  help  Look into weight watcher for diabetes .  Plan also.  Will be contacted about a nutrition referral  .   Plan ROV in  3-6 months or as needed  To help coordinate care .   Get flu vaccine in the fall                Instructions      Return for 3-6 months depending on lab and how doing. .  Metformin 500 mg   To try and increase .    As tolerated   To 2 per day   After 2-3 week. s      ROS: See pertinent positives and negatives per HPI.  Past Medical History:  Diagnosis Date  . Allergic rhinitis   . Diabetes mellitus   . Hyperlipidemia   . Hypertension    x many years (since birht of her son at 58) Had workup with renal artery angiogram that showed no evidence for fibromuscular dysplasia ( no significant renal artery stenosis.) ECHO (8/11) EF 60-65%, mild LVH, no regional WMAs.  . Low back pain     . Morbid obesity ([place])   . OSA (obstructive sleep apnea) 12/28/2015    Family History  Problem Relation Age of Onset  . Cancer Mother   . Hypertension Mother   . Other Father        died in [place] History   Socioeconomic History  . Marital status: Married    Spouse name: Not on file  . Number of children: Not on file  . Years of education: Not on file  . Highest education level: Not on file  Occupational History  . Not on file  Social Needs  . Financial resource strain: Not on file  . Food insecurity:    Worry: Not on file    Inability: Not on file  . Transportation needs:    Medical: Not on file    Non-medical: Not on file  Tobacco Use  . Smoking status: Never Smoker  . Smokeless tobacco: Never Used  Substance and Sexual Activity  . Alcohol use: No    Alcohol/week: 0.0 standard drinks  . Drug use: No  . Sexual activity: Not on file  Lifestyle  . Physical activity:    Days per week: Not on file    Minutes per session: Not on file  . Stress: Not on file  Relationships  . Social connections:    Talks on phone: Not on file    Gets together: Not on file    Attends religious service: Not on file    Active member of club or organization: Not on file    Attends meetings of clubs or organizations: Not on file    Relationship status: Not on file  Other Topics Concern  . Not on file  Social History Narrative   Married   Regular exercise-yes   HH of 4    26 and 22    No pets   Works at [employer] Medications Prior to Visit  Medication Sig Dispense Refill  . albuterol (PROAIR HFA) 108 (90 Base) MCG/ACT inhaler Inhale 1-2 puffs into the lungs 2 (two) times daily as needed for wheezing or shortness of breath.    . Ascorbic Acid (VITAMIN C PO) Take 1 tablet by mouth at bedtime.    . Azelastine-Fluticasone (DYMISTA) 137-50 MCG/ACT SUSP Place 2 sprays into the nose daily. 23 g  5  . carvedilol (COREG) 6.25 MG tablet TAKE 1 TABLET (6.25  MG TOTAL) BY MOUTH 2 (TWO) TIMES DAILY WITH A MEAL. FOR HYPERTENSION (Patient not taking: Reported on 12/30/2017) 180 tablet 0  . chlorpheniramine (CHLOR-TRIMETON) 4 MG tablet Take 1 tablet (4 mg total) by mouth 2 (two) times daily as needed for allergies. (Patient not taking: Reported on 12/30/2017) 14 tablet 0  . felodipine (PLENDIL) 10 MG 24 hr tablet Take 1 tablet (10 mg total) by mouth daily. DUE FOR ANNUAL VISIT 30 tablet 0  . glucose blood (ACCU-CHEK GUIDE) test strip Use as instructed to check sugar 3 times daily 300 each 1  . Insulin Glargine (BASAGLAR KWIKPEN) 100 UNIT/ML SOPN Inject 0.2 mLs (20 Units total) into the skin at bedtime. (Patient not taking: Reported on 12/30/2017) 15 pen 0  . Insulin Pen Needle (BD PEN NEEDLE NANO U/F) 32G X 4 MM MISC Inject 16 units daily at bedtime (Patient not taking: Reported on 12/30/2017) 100 each 5  . KLOR-CON M20 20 MEQ tablet TAKE 1 TABLET BY MOUTH EVERY DAY 90 tablet 0  . Lancets Misc. (ACCU-CHEK MULTICLIX LANCET DEV) KIT Use as instructed to check sugar 3 times daily (Patient not taking: Reported on 12/30/2017) 1 each prn  . metFORMIN (GLUCOPHAGE-XR) 500 MG 24 hr tablet Take 1 tablet (500 mg total) by mouth daily with breakfast. Can increase to twice a day . After 3 weeks as tolerated 60 tablet 5  . montelukast (SINGULAIR) 10 MG tablet Take 1 tablet (10 mg total) by mouth at bedtime. (Patient not taking: Reported on 12/30/2017) 30 tablet 2  . Multiple Vitamin (MULTIVITAMIN WITH MINERALS) TABS tablet Take 1 tablet by mouth at bedtime. One a Day 50 plus    . ONE TOUCH LANCETS MISC Use to check 3 times daily 300 each 5  . pantoprazole (PROTONIX) 40 MG tablet TAKE 1 TABLET (40 MG TOTAL) BY MOUTH DAILY. 90 tablet 0  . potassium chloride (KLOR-CON) 20 MEQ packet Take 40 mEq by mouth daily.     No facility-administered medications prior to visit.      EXAM:  There were no vitals taken for this visit.  There is no height or weight on file to calculate  BMI.  GENERAL: vitals reviewed and listed above, alert, oriented, appears well hydrated and in no acute distress HEENT: atraumatic, conjunctiva  clear, no obvious abnormalities on inspection of external nose and ears OP : no lesion edema or exudate  NECK: no obvious masses on inspection palpation  LUNGS: clear to auscultation bilaterally, no wheezes, rales or rhonchi, good air movement CV: HRRR, no clubbing cyanosis or  peripheral edema nl cap refill  MS: moves all extremities without noticeable focal  abnormality PSYCH: pleasant and cooperative, no obvious depression or anxiety Lab Results  Component Value Date   WBC 5.9 07/25/2017   HGB 13.9 07/25/2017   HCT 42.6 07/25/2017   PLT 349.0 07/25/2017   GLUCOSE 235 (H) 12/30/2017   CHOL 195 12/30/2017   TRIG 123.0 12/30/2017   HDL 46.60 12/30/2017   LDLCALC 124 (H) 12/30/2017   ALT 17 12/30/2017   AST 13 12/30/2017   NA 135 12/30/2017   K 3.5 12/30/2017   CL 93 (L) 12/30/2017   CREATININE 1.05 12/30/2017   BUN 18 12/30/2017   CO2 33 (H) 12/30/2017   TSH 1.29 12/30/2017   INR 1.0 RATIO 07/08/2006   HGBA1C 10.5 (H) 12/30/2017   MICROALBUR 26.4 (H) 06/26/2016   BP Readings from  Last 3 Encounters:  12/30/17 (!) 146/86  10/28/17 138/78  08/14/17 122/80   Wt Readings from Last 3 Encounters:  12/30/17 (!) 313 lb 2 oz (142 kg)  10/28/17 (!) 315 lb (142.9 kg)  08/14/17 (!) 314 lb 3.2 oz (142.5 kg)     ASSESSMENT AND PLAN:  Discussed the following assessment and plan:  MORBID OBESITY  Sleep apnea, unspecified type  Diabetes mellitus type 2 in obese [facility])  Essential hypertension  Medication management Failure to follow through for  Many reasons over the years May    need to have her see other provider team if not able to help her with  Getting things under control  -Patient advised to return or notify health care team  if  new concerns arise.  There are no Patient Instructions on file for this visit.   Standley Brooking.  Brylan Seubert M.D.

## 2018-05-25 ENCOUNTER — Telehealth: Payer: Self-pay | Admitting: Internal Medicine

## 2018-05-25 ENCOUNTER — Ambulatory Visit: Payer: BLUE CROSS/BLUE SHIELD | Admitting: Internal Medicine

## 2018-05-25 ENCOUNTER — Other Ambulatory Visit (INDEPENDENT_AMBULATORY_CARE_PROVIDER_SITE_OTHER): Payer: BLUE CROSS/BLUE SHIELD

## 2018-05-25 DIAGNOSIS — E669 Obesity, unspecified: Secondary | ICD-10-CM

## 2018-05-25 DIAGNOSIS — E119 Type 2 diabetes mellitus without complications: Secondary | ICD-10-CM

## 2018-05-25 DIAGNOSIS — Z114 Encounter for screening for human immunodeficiency virus [HIV]: Secondary | ICD-10-CM | POA: Diagnosis not present

## 2018-05-25 DIAGNOSIS — Z1159 Encounter for screening for other viral diseases: Secondary | ICD-10-CM

## 2018-05-25 DIAGNOSIS — Z79899 Other long term (current) drug therapy: Secondary | ICD-10-CM | POA: Diagnosis not present

## 2018-05-25 DIAGNOSIS — E1169 Type 2 diabetes mellitus with other specified complication: Secondary | ICD-10-CM

## 2018-05-25 DIAGNOSIS — I10 Essential (primary) hypertension: Secondary | ICD-10-CM | POA: Diagnosis not present

## 2018-05-25 DIAGNOSIS — E785 Hyperlipidemia, unspecified: Secondary | ICD-10-CM

## 2018-05-25 LAB — CBC WITH DIFFERENTIAL/PLATELET
BASOS PCT: 1.1 % (ref 0.0–3.0)
Basophils Absolute: 0.1 10*3/uL (ref 0.0–0.1)
Eosinophils Absolute: 0.1 10*3/uL (ref 0.0–0.7)
Eosinophils Relative: 1.9 % (ref 0.0–5.0)
HCT: 43 % (ref 36.0–46.0)
Hemoglobin: 14.4 g/dL (ref 12.0–15.0)
Lymphocytes Relative: 39.2 % (ref 12.0–46.0)
Lymphs Abs: 2 10*3/uL (ref 0.7–4.0)
MCHC: 33.6 g/dL (ref 30.0–36.0)
MCV: 94.6 fl (ref 78.0–100.0)
Monocytes Absolute: 0.4 10*3/uL (ref 0.1–1.0)
Monocytes Relative: 8.8 % (ref 3.0–12.0)
NEUTROS ABS: 2.5 10*3/uL (ref 1.4–7.7)
Neutrophils Relative %: 49 % (ref 43.0–77.0)
Platelets: 382 10*3/uL (ref 150.0–400.0)
RBC: 4.55 Mil/uL (ref 3.87–5.11)
RDW: 12.8 % (ref 11.5–15.5)
WBC: 5.1 10*3/uL (ref 4.0–10.5)

## 2018-05-25 LAB — BASIC METABOLIC PANEL
BUN: 14 mg/dL (ref 6–23)
CO2: 34 mEq/L — ABNORMAL HIGH (ref 19–32)
CREATININE: 0.99 mg/dL (ref 0.40–1.20)
Calcium: 9.4 mg/dL (ref 8.4–10.5)
Chloride: 95 mEq/L — ABNORMAL LOW (ref 96–112)
GFR: 70.38 mL/min (ref >60.00)
Glucose, Bld: 211 mg/dL — ABNORMAL HIGH (ref 70–99)
Potassium: 4 mEq/L (ref 3.5–5.1)
Sodium: 137 mEq/L (ref 135–145)

## 2018-05-25 LAB — LIPID PANEL
Cholesterol: 189 mg/dL (ref 0–200)
HDL: 48 mg/dL (ref >39.00)
LDL Cholesterol: 113 mg/dL — ABNORMAL HIGH (ref 0–99)
NONHDL: 141.26
Total CHOL/HDL Ratio: 4
Triglycerides: 143 mg/dL (ref 0.0–149.0)
VLDL: 28.6 mg/dL (ref 0.0–40.0)

## 2018-05-25 LAB — MICROALBUMIN / CREATININE URINE RATIO
Creatinine,U: 161.7 mg/dL
Microalb Creat Ratio: 1.5 mg/g (ref 0.0–30.0)
Microalb, Ur: 2.4 mg/dL — ABNORMAL HIGH (ref 0.0–1.9)

## 2018-05-25 LAB — HEMOGLOBIN A1C: Hgb A1c MFr Bld: 10.5 % — ABNORMAL HIGH (ref 4.6–6.5)

## 2018-05-25 MED ORDER — FELODIPINE ER 10 MG PO TB24: 10.0000 mg | ORAL_TABLET | Freq: Every day | ORAL | 0 refills | Status: DC

## 2018-05-25 NOTE — Telephone Encounter (Signed)
She also has a name of a doctor that she would like to be referred to for her Diabetes.   Sharl Ma with [facility]  [address] [address] [phone]

## 2018-05-25 NOTE — Telephone Encounter (Signed)
rx refilled  Message sent to Dr Fabian SharpPanosh to okay referral.

## 2018-05-25 NOTE — Addendum Note (Signed)
Addended by: Maisie Fus on: 05/25/2018 05:03 PM   Modules accepted: Orders

## 2018-05-25 NOTE — Telephone Encounter (Signed)
The patient is needing a refill on her  felodipine (PLENDIL) 10 MG 24 hr tablet  90 days supplies  Sent to:   [id number] - [place], [place] - [address] [facility] [address] [phone] (Phone) [phone] (Fax)

## 2018-05-26 LAB — HEPATITIS C ANTIBODY
Hepatitis C Ab: NONREACTIVE
SIGNAL TO CUT-OFF: 0.02 (ref <1.00)

## 2018-05-26 LAB — HIV ANTIBODY (ROUTINE TESTING W REFLEX): HIV 1&2 Ab, 4th Generation: NONREACTIVE

## 2018-05-27 ENCOUNTER — Telehealth: Payer: Self-pay | Admitting: Internal Medicine

## 2018-05-27 DIAGNOSIS — E669 Obesity, unspecified: Principal | ICD-10-CM

## 2018-05-27 DIAGNOSIS — E1169 Type 2 diabetes mellitus with other specified complication: Secondary | ICD-10-CM

## 2018-05-27 NOTE — Telephone Encounter (Signed)
Copied from CRM [id number]. Topic: General - Other >> May 27, 2018  3:16 PM Tamela OddiHarris, Bruce Churilla J wrote: Reason for CRM: Patient called to inform the doctor that she is taking her Metformin 2 x day.  If there are any questions, please give the patient a call at [phone]

## 2018-05-27 NOTE — Telephone Encounter (Signed)
Sure ok to do referral   See result note also  May we send in  farxiga or similar medication or have her start back on her insulin in the interim  ?

## 2018-05-29 NOTE — Telephone Encounter (Addendum)
Notes recorded by Madelin HeadingsPanosh, Wanda K, MD on 05/27/2018 at 1:48 PM EST Hg a1c is still 10.5 and bg over 200  Advise add back the insulin  20 units? Or Add farxiga 10 mg per day And proceed with referral to dr Sharl MaKerr in the interim as she requested  We will go over rest of labs when she comes in February ---- Spoke with patient and she was still very upset about her previous interaction with our office. I was able to get an answer about what she wanted to do as far as medication went -- she wants to just start back on Insulin. Pt is currently taking Metformin 2x day. Dr Fabian SharpPanosh is okay with this. Pt is was not answering my questions straight forward or really listening to her results or what I was asking regarding a treatment plan. Pt kept changing the subject going back to Monday when she came in for labs and her OV and why she was so upset.  I apologized several times for any inconvenience and miscommunication.   Referral placed to Dr Sharl MaKerr  Will send to Dr Fabian SharpPanosh to advise on Rx for Insulin.  Pt uses [id number] - [place], [place] - [address] [phone]

## 2018-06-01 ENCOUNTER — Other Ambulatory Visit: Payer: Self-pay

## 2018-06-01 DIAGNOSIS — E669 Obesity, unspecified: Principal | ICD-10-CM

## 2018-06-01 DIAGNOSIS — E1169 Type 2 diabetes mellitus with other specified complication: Secondary | ICD-10-CM

## 2018-06-01 NOTE — Telephone Encounter (Signed)
Pt wants everyone to know she is very sorry for getting upset the other day.  Pt states life is too short and she apologizes for getting upset. She looks forward to having a better conversation the next time

## 2018-06-01 NOTE — Telephone Encounter (Signed)
Referral has been placed to Dr.Kerr

## 2018-06-10 ENCOUNTER — Telehealth: Payer: Self-pay | Admitting: Internal Medicine

## 2018-06-10 NOTE — Telephone Encounter (Signed)
Okay to release result notes? Please advise

## 2018-06-10 NOTE — Telephone Encounter (Signed)
Per pt call she is requesting a copy of her last  lab results to be mail to her home please advise

## 2018-06-10 NOTE — Telephone Encounter (Signed)
Yes pelase send her copy of her last lab tests results

## 2018-06-11 NOTE — Telephone Encounter (Signed)
Sent off results to be mailed

## 2018-06-19 NOTE — Progress Notes (Signed)
Chief Complaint  Patient presents with  . Follow-up    pt is here for follow up on diabetes and hypertension. pt states her blood pressure has been high and that she gets headaches and dizziness. Pt would like to discuss lab results     HPI: Patient  [name]  58 y.o. comes in today for follow-up of her abnormal lab tests and diabetes. We did a referral to outside endocrinologist but not accepting new patients.  She is going to have to find a different endocrinologist. She is not taking the insulin because she does not have any even though we recommended taking it in the interim Was actually off metformin accidentally and thought she could tolerate it went back on 1 a day and had several trips to the bathroom that interfered with her work schedule.  When taking 1 a day Sugars are high over 200 she has to eat various partly to get them down under 200. The last time she has taken insulin was 35 units at Thanksgiving.  Has not followed through with sleep apnea eval either Blood pressures been okay to high depending on when checked. Is a bit chagrined that her sugars cannot be controlled by diet.   Health Maintenance  Topic Date Due  . PNEUMOCOCCAL POLYSACCHARIDE VACCINE AGE 65-64 HIGH RISK  01/24/1965  . TETANUS/TDAP  08/31/2006  . MAMMOGRAM  01/24/2013  . COLONOSCOPY  01/24/2013  . PAP SMEAR-Modifier  03/09/2017  . FOOT EXAM  06/26/2017  . OPHTHALMOLOGY EXAM  12/26/2017  . INFLUENZA VACCINE  08/04/2018 (Originally 12/04/2017)  . HEMOGLOBIN A1C  11/23/2018  . URINE MICROALBUMIN  05/26/2019  . Hepatitis C Screening  Completed  . HIV Screening  Completed    ROS:  GEN/ HEENT: No fever, significant weight changes sweats headaches vision problems hearing changes, CV/ PULM; No chest pain shortness of breath cough, syncope,edema  change in exercise tolerance. GI /GU: No adominal pain, vomiting, change in bowel habits. No blood in the stool. No significant GU  symptoms. SKIN/HEME: ,no acute skin rashes suspicious lesions or bleeding. No lymphadenopathy, nodules, masses.  NEURO/ PSYCH:  No neurologic signs such as weakness numbness. No depression anxiety. IMM/ Allergy: No unusual infections.  Allergy .   REST of 12 system review negative except as per HPI   Past Medical History:  Diagnosis Date  . Allergic rhinitis   . Diabetes mellitus   . Hyperlipidemia   . Hypertension    x many years (since birht of her son at 25) Had workup with renal artery angiogram that showed no evidence for fibromuscular dysplasia ( no significant renal artery stenosis.) ECHO (8/11) EF 60-65%, mild LVH, no regional WMAs.  . Low back pain   . Morbid obesity ([place])   . OSA (obstructive sleep apnea) 12/28/2015    Past Surgical History:  Procedure Laterality Date  . ABDOMINAL HYSTERECTOMY     partial for fibroids  . TUBAL LIGATION      Family History  Problem Relation Age of Onset  . Cancer Mother   . Hypertension Mother   . Other Father        died in [place] History   Socioeconomic History  . Marital status: Married    Spouse name: Not on file  . Number of children: Not on file  . Years of education: Not on file  . Highest education level: Not on file  Occupational History  . Not on file  Social Needs  .  Financial resource strain: Not on file  . Food insecurity:    Worry: Not on file    Inability: Not on file  . Transportation needs:    Medical: Not on file    Non-medical: Not on file  Tobacco Use  . Smoking status: Never Smoker  . Smokeless tobacco: Never Used  Substance and Sexual Activity  . Alcohol use: No    Alcohol/week: 0.0 standard drinks  . Drug use: No  . Sexual activity: Not on file  Lifestyle  . Physical activity:    Days per week: Not on file    Minutes per session: Not on file  . Stress: Not on file  Relationships  . Social connections:    Talks on phone: Not on file    Gets together: Not on file    Attends  religious service: Not on file    Active member of club or organization: Not on file    Attends meetings of clubs or organizations: Not on file    Relationship status: Not on file  Other Topics Concern  . Not on file  Social History Narrative   Married   Regular exercise-yes   HH of 4    28 and 24    No pets   Works at [employer] Medications Prior to Visit  Medication Sig Dispense Refill  . albuterol (PROAIR HFA) 108 (90 Base) MCG/ACT inhaler Inhale 1-2 puffs into the lungs 2 (two) times daily as needed for wheezing or shortness of breath.    . Ascorbic Acid (VITAMIN C PO) Take 1 tablet by mouth at bedtime.    . Azelastine-Fluticasone (DYMISTA) 137-50 MCG/ACT SUSP Place 2 sprays into the nose daily. 23 g 5  . felodipine (PLENDIL) 10 MG 24 hr tablet Take 1 tablet (10 mg total) by mouth daily. DUE FOR ANNUAL VISIT 90 tablet 0  . glucose blood (ACCU-CHEK GUIDE) test strip Use as instructed to check sugar 3 times daily 300 each 1  . KLOR-CON M20 20 MEQ tablet TAKE 1 TABLET BY MOUTH EVERY DAY 90 tablet 0  . metFORMIN (GLUCOPHAGE-XR) 500 MG 24 hr tablet Take 1 tablet (500 mg total) by mouth daily with breakfast. Can increase to twice a day . After 3 weeks as tolerated 60 tablet 5  . Multiple Vitamin (MULTIVITAMIN WITH MINERALS) TABS tablet Take 1 tablet by mouth at bedtime. One a Day 50 plus    . ONE TOUCH LANCETS MISC Use to check 3 times daily 300 each 5  . pantoprazole (PROTONIX) 40 MG tablet TAKE 1 TABLET (40 MG TOTAL) BY MOUTH DAILY. 90 tablet 0  . potassium chloride (KLOR-CON) 20 MEQ packet Take 40 mEq by mouth daily.    . carvedilol (COREG) 6.25 MG tablet TAKE 1 TABLET (6.25 MG TOTAL) BY MOUTH 2 (TWO) TIMES DAILY WITH A MEAL. FOR HYPERTENSION (Patient not taking: Reported on 12/30/2017) 180 tablet 0  . chlorpheniramine (CHLOR-TRIMETON) 4 MG tablet Take 1 tablet (4 mg total) by mouth 2 (two) times daily as needed for allergies. (Patient not taking: Reported on  12/30/2017) 14 tablet 0  . Insulin Pen Needle (BD PEN NEEDLE NANO U/F) 32G X 4 MM MISC Inject 16 units daily at bedtime (Patient not taking: Reported on 12/30/2017) 100 each 5  . Lancets Misc. (ACCU-CHEK MULTICLIX LANCET DEV) KIT Use as instructed to check sugar 3 times daily (Patient not taking: Reported on 12/30/2017) 1 each prn  . montelukast (SINGULAIR) 10  MG tablet Take 1 tablet (10 mg total) by mouth at bedtime. (Patient not taking: Reported on 12/30/2017) 30 tablet 2  . Insulin Glargine (BASAGLAR KWIKPEN) 100 UNIT/ML SOPN Inject 0.2 mLs (20 Units total) into the skin at bedtime. (Patient not taking: Reported on 12/30/2017) 15 pen 0   No facility-administered medications prior to visit.      EXAM:  BP 138/82 (BP Location: Left Arm, Patient Position: Sitting, Cuff Size: Large)   Pulse 87   Temp 98.3 F (36.8 C) (Oral)   Wt (!) 310 lb 8 oz (140.8 kg)   BMI 48.63 kg/m   Body mass index is 48.63 kg/m. Wt Readings from Last 3 Encounters:  06/22/18 (!) 310 lb 8 oz (140.8 kg)  12/30/17 (!) 313 lb 2 oz (142 kg)  10/28/17 (!) 315 lb (142.9 kg)    Physical Exam: Vital signs reviewed HER:DEYC is a well-developed well-nourished alert cooperative    who appearsr stated age in no acute distress.  Looks tired sleep deprived but normal speech coherent. HEENT: normocephalic atraumatic , Eyes: PERRL EOM's full, conjunctiva clear, Nares: paten,t no deformity discharge or tenderness., Ears: no deformity EAC's clear TMs with normal landmarks. Mouth: clear OP, no lesions, edema.  Moist mucous membranes. Dentition in adequate repair. NECK: supple without masses, thyromegaly or bruits. CHEST/PULM:  Clear to auscultation and percussion breath sounds equal no wheeze , rales or rhonchi. No chest wall deformities or tendernessCV: PMI is nondisplaced, S1 S2 no gallops, murmurs, rubs. Peripheral pulses are has not.  No JVD .  Extremtities:  No clubbing cyanosis 2-3+ edema of feet no acute joint swelling or  redness no focal atrophy NEURO:  Oriented x3, cranial nerves 3-12 appear to be intact, no obvious focal weakness,gait within normal limits l SKIN: No acute rashes normal turgor, color, no bruising or petechiae. PSYCH: Oriented, good eye contact, no obvious depression anxiety states that she knows that it is in her ballpark and her fault for her sugars being out Knows what she should do but has not implemented.  Lab Results  Component Value Date   WBC 5.1 05/25/2018   HGB 14.4 05/25/2018   HCT 43.0 05/25/2018   PLT 382.0 05/25/2018   GLUCOSE 211 (H) 05/25/2018   CHOL 189 05/25/2018   TRIG 143.0 05/25/2018   HDL 48.00 05/25/2018   LDLCALC 113 (H) 05/25/2018   ALT 17 12/30/2017   AST 13 12/30/2017   NA 137 05/25/2018   K 4.0 05/25/2018   CL 95 (L) 05/25/2018   CREATININE 0.99 05/25/2018   BUN 14 05/25/2018   CO2 34 (H) 05/25/2018   TSH 1.29 12/30/2017   INR 1.0 RATIO 07/08/2006   HGBA1C 10.5 (H) 05/25/2018   MICROALBUR 2.4 (H) 05/25/2018    BP Readings from Last 3 Encounters:  06/22/18 138/82  12/30/17 (!) 146/86  10/28/17 138/78   Wt Readings from Last 3 Encounters:  06/22/18 (!) 310 lb 8 oz (140.8 kg)  12/30/17 (!) 313 lb 2 oz (142 kg)  10/28/17 (!) 315 lb (142.9 kg)    Lab results reviewed with patient   ASSESSMENT AND PLAN:  Discussed the following assessment and plan:  Uncontrolled type 2 diabetes mellitus with hyperglycemia ([place])  Morbid obesity, unspecified obesity type ([place])  Medication management  Essential hypertension  Hyperlipidemia, unspecified hyperlipidemia type Type 2 diabetes associated with obesity w side effects of medicine... initially thought she could tolerate the metformin but had to stop.   wants to try again but I discussed  with her that this will not be enough to control the diabetes. Prescription sent in for the insulin to take 30 units a day and then add on for [employer] or similar medicine we discussed Ozempic Victoza also I am told her  I thought metformin was an excellent medicine but do not think she is going to be able to tolerated at the dose as needed.  Also reinforced that one medicine alone will not control the blood sugar and the newer medicines are much better for avoiding weight gain and better control.  There is a cost and she should look at co-pay coupons. We reviewed her cholesterol but at this time need to be aggressive on blood sugar control and that she probably should be on a statin medicine but would rather her add diabetes medicine first. She will continue looking for a new endocrinologist, It  is been quite a while. Discussed that her morbid obesity is adding to the problem as well as probable sleep apnea. She should contact us through my chart or other in about 3 weeks about what her blood sugars are doing and I can give her her advice.  Prescription given for  farxiga   In the interim and I hope she will try it  .   Plan follow up  Until she gets and endocrine Patient Care Team: Makhai Fulco, Standley Brooking, MD as PCP - General Druscilla Brownie, MD (Dermatology) Glendale Chard, MD as Attending Physician (Internal Medicine) Patient Instructions  Restart the insulin for  Now  30 units per day  Send in readings of  BG twice a day after 2-3 weeks and can consdier adding   Other medication  farxiga   As discussed .   consider adding  ozempic  Weekly  Injection or Victoza daily  injection in addition  OR   Jardiance  farxiga   A pill used daily      In addition to the insulin this can  .  these medication do not cause weight gain and can be associated with  weight loss   And decrease  Incidence of heart failure etc   YOU will need more than once medication  In addition to eating healthy to control the diabetes at this time.              Standley Brooking. Lamyia Cdebaca M.D.

## 2018-06-22 ENCOUNTER — Ambulatory Visit: Payer: BLUE CROSS/BLUE SHIELD | Admitting: Internal Medicine

## 2018-06-22 ENCOUNTER — Encounter: Payer: Self-pay | Admitting: Internal Medicine

## 2018-06-22 VITALS — BP 138/82 | HR 87 | Temp 98.3°F | Wt 310.5 lb

## 2018-06-22 DIAGNOSIS — E785 Hyperlipidemia, unspecified: Secondary | ICD-10-CM

## 2018-06-22 DIAGNOSIS — Z79899 Other long term (current) drug therapy: Secondary | ICD-10-CM

## 2018-06-22 DIAGNOSIS — I10 Essential (primary) hypertension: Secondary | ICD-10-CM | POA: Diagnosis not present

## 2018-06-22 DIAGNOSIS — E1165 Type 2 diabetes mellitus with hyperglycemia: Secondary | ICD-10-CM

## 2018-06-22 MED ORDER — DAPAGLIFLOZIN PROPANEDIOL 10 MG PO TABS: 10.0000 mg | ORAL_TABLET | Freq: Every day | ORAL | 3 refills | Status: DC

## 2018-06-22 MED ORDER — BASAGLAR KWIKPEN 100 UNIT/ML ~~LOC~~ SOPN: 30.0000 [IU] | PEN_INJECTOR | Freq: Every day | SUBCUTANEOUS | 0 refills | Status: DC

## 2018-06-22 NOTE — Patient Instructions (Addendum)
Restart the insulin for  Now  30 units per day  Send in readings of  BG twice a day after 2-3 weeks and can consdier adding   Other medication  farxiga   As discussed .   consider adding  ozempic  Weekly  Injection or Victoza daily  injection in addition  OR   Jardiance  farxiga   A pill used daily      In addition to the insulin this can  .  these medication do not cause weight gain and can be associated with  weight loss   And decrease  Incidence of heart failure etc   YOU will need more than once medication  In addition to eating healthy to control the diabetes at this time.

## 2018-06-25 ENCOUNTER — Ambulatory Visit: Payer: BLUE CROSS/BLUE SHIELD | Admitting: Internal Medicine

## 2018-07-10 ENCOUNTER — Encounter: Payer: Self-pay | Admitting: Internal Medicine

## 2018-07-10 ENCOUNTER — Ambulatory Visit: Payer: BLUE CROSS/BLUE SHIELD | Admitting: Internal Medicine

## 2018-07-10 VITALS — BP 144/78 | HR 92 | Temp 98.4°F | Wt 312.6 lb

## 2018-07-10 DIAGNOSIS — J309 Allergic rhinitis, unspecified: Secondary | ICD-10-CM

## 2018-07-10 DIAGNOSIS — R05 Cough: Secondary | ICD-10-CM

## 2018-07-10 DIAGNOSIS — J988 Other specified respiratory disorders: Secondary | ICD-10-CM | POA: Diagnosis not present

## 2018-07-10 DIAGNOSIS — J069 Acute upper respiratory infection, unspecified: Secondary | ICD-10-CM

## 2018-07-10 DIAGNOSIS — R059 Cough, unspecified: Secondary | ICD-10-CM

## 2018-07-10 DIAGNOSIS — Z79899 Other long term (current) drug therapy: Secondary | ICD-10-CM

## 2018-07-10 LAB — POCT INFLUENZA A/B
INFLUENZA B, POC: NEGATIVE
Influenza A, POC: NEGATIVE

## 2018-07-10 MED ORDER — PREDNISONE 20 MG PO TABS: 20.0000 mg | ORAL_TABLET | Freq: Two times a day (BID) | ORAL | 0 refills | Status: DC

## 2018-07-10 MED ORDER — IPRATROPIUM-ALBUTEROL 0.5-2.5 (3) MG/3ML IN SOLN
3.0000 mL | Freq: Once | RESPIRATORY_TRACT | Status: AC
  Administered 2018-07-10: 3 mL via RESPIRATORY_TRACT

## 2018-07-10 NOTE — Patient Instructions (Addendum)
Acts like acute viral respiratory infection triggering  Your wheezing  .  Consider 2 -5 days of prednisone if needed .   Can use  Albuterol every 6 hours  And consider adding other inhalers   mucinex  Plain and mucinex dm if need cough suppression.

## 2018-07-10 NOTE — Progress Notes (Signed)
Chief Complaint  Patient presents with  . Cough    x4 days cough,nasal drainage,stuffiness, sinus pressure, bodyaches, and fatigue     HPI: [name] 58 y.o. come in for SDA   Of above  Lots of coughing  And body aches . Better at home.      Supervisor at work was concerned   Inhaler more    Used bid  And cough drops.  Helps some feels  Wheezy   dymysta has been very helpful from allergist  tessalone  Causes nausea using mucinex   Day  Seems to cough attacks more at work than at hoem ROS: See pertinent positives and negatives per HPI. No cp albuterol seems to help   Past Medical History:  Diagnosis Date  . Allergic rhinitis   . Diabetes mellitus   . Hyperlipidemia   . Hypertension    x many years (since birht of her son at 38) Had workup with renal artery angiogram that showed no evidence for fibromuscular dysplasia ( no significant renal artery stenosis.) ECHO (8/11) EF 60-65%, mild LVH, no regional WMAs.  . Low back pain   . Morbid obesity ([place])   . OSA (obstructive sleep apnea) 12/28/2015    Family History  Problem Relation Age of Onset  . Cancer Mother   . Hypertension Mother   . Other Father        died in [place] History   Socioeconomic History  . Marital status: Married    Spouse name: Not on file  . Number of children: Not on file  . Years of education: Not on file  . Highest education level: Not on file  Occupational History  . Not on file  Social Needs  . Financial resource strain: Not on file  . Food insecurity:    Worry: Not on file    Inability: Not on file  . Transportation needs:    Medical: Not on file    Non-medical: Not on file  Tobacco Use  . Smoking status: Never Smoker  . Smokeless tobacco: Never Used  Substance and Sexual Activity  . Alcohol use: No    Alcohol/week: 0.0 standard drinks  . Drug use: No  . Sexual activity: Not on file  Lifestyle  . Physical activity:    Days per week: Not on file    Minutes per  session: Not on file  . Stress: Not on file  Relationships  . Social connections:    Talks on phone: Not on file    Gets together: Not on file    Attends religious service: Not on file    Active member of club or organization: Not on file    Attends meetings of clubs or organizations: Not on file    Relationship status: Not on file  Other Topics Concern  . Not on file  Social History Narrative   Married   Regular exercise-yes   HH of 4    26 and 22    No pets   Works at [employer] Medications Prior to Visit  Medication Sig Dispense Refill  . albuterol (PROAIR HFA) 108 (90 Base) MCG/ACT inhaler Inhale 1-2 puffs into the lungs 2 (two) times daily as needed for wheezing or shortness of breath.    . Ascorbic Acid (VITAMIN C PO) Take 1 tablet by mouth at bedtime.    . Azelastine-Fluticasone (DYMISTA) 137-50 MCG/ACT SUSP Place 2 sprays into  the nose daily. 23 g 5  . dapagliflozin propanediol (FARXIGA) 10 MG TABS tablet Take 10 mg by mouth daily. 30 tablet 3  . felodipine (PLENDIL) 10 MG 24 hr tablet Take 1 tablet (10 mg total) by mouth daily. DUE FOR ANNUAL VISIT 90 tablet 0  . glucose blood (ACCU-CHEK GUIDE) test strip Use as instructed to check sugar 3 times daily 300 each 1  . Insulin Glargine (BASAGLAR KWIKPEN) 100 UNIT/ML SOPN Inject 0.3 mLs (30 Units total) into the skin at bedtime. 15 pen 0  . KLOR-CON M20 20 MEQ tablet TAKE 1 TABLET BY MOUTH EVERY DAY 90 tablet 0  . Multiple Vitamin (MULTIVITAMIN WITH MINERALS) TABS tablet Take 1 tablet by mouth at bedtime. One a Day 50 plus    . ONE TOUCH LANCETS MISC Use to check 3 times daily 300 each 5  . pantoprazole (PROTONIX) 40 MG tablet TAKE 1 TABLET (40 MG TOTAL) BY MOUTH DAILY. 90 tablet 0  . potassium chloride (KLOR-CON) 20 MEQ packet Take 40 mEq by mouth daily.    . carvedilol (COREG) 6.25 MG tablet TAKE 1 TABLET (6.25 MG TOTAL) BY MOUTH 2 (TWO) TIMES DAILY WITH A MEAL. FOR HYPERTENSION (Patient not taking:  Reported on 12/30/2017) 180 tablet 0  . chlorpheniramine (CHLOR-TRIMETON) 4 MG tablet Take 1 tablet (4 mg total) by mouth 2 (two) times daily as needed for allergies. (Patient not taking: Reported on 12/30/2017) 14 tablet 0  . Insulin Pen Needle (BD PEN NEEDLE NANO U/F) 32G X 4 MM MISC Inject 16 units daily at bedtime (Patient not taking: Reported on 12/30/2017) 100 each 5  . Lancets Misc. (ACCU-CHEK MULTICLIX LANCET DEV) KIT Use as instructed to check sugar 3 times daily (Patient not taking: Reported on 12/30/2017) 1 each prn  . metFORMIN (GLUCOPHAGE-XR) 500 MG 24 hr tablet Take 1 tablet (500 mg total) by mouth daily with breakfast. Can increase to twice a day . After 3 weeks as tolerated (Patient not taking: Reported on 07/10/2018) 60 tablet 5  . montelukast (SINGULAIR) 10 MG tablet Take 1 tablet (10 mg total) by mouth at bedtime. (Patient not taking: Reported on 12/30/2017) 30 tablet 2   No facility-administered medications prior to visit.      EXAM:  BP (!) 144/78 (BP Location: Right Arm, Patient Position: Sitting, Cuff Size: Large)   Pulse 92   Temp 98.4 F (36.9 C) (Oral)   Wt (!) 312 lb 9.6 oz (141.8 kg)   SpO2 95%   BMI 48.96 kg/m   Body mass index is 48.96 kg/m. WDWN in NAD  quiet respirations; mildly congested  somewhat hoarse. Non toxic .  Coughing at times dry  Nasal congstion HEENT: Normocephalic ;atraumatic , Eyes;  PERRL, EOMs  Full, lids and conjunctiva clear,,Ears: no deformities, canals nl, TM landmarks normal, Nose: no deformity or discharge but congested;face non  tender Mouth : OP clear without lesion or edema . Neck: Supple without adenopathy or masses or bruits Chest:   Tight  No wheeze  bs =   After duoneb   Inc air movement and feels better  CV:  S1-S2 no gallops or murmurs peripheral perfusion is normal Skin :nl perfusion and no acute rashes     BP Readings from Last 3 Encounters:  07/10/18 (!) 144/78  06/22/18 138/82  12/30/17 (!) 146/86   Flu screen neg    ASSESSMENT AND PLAN:  Discussed the following assessment and plan:  Cough - Plan: POC Influenza A/B, DG Chest 2 View  Wheezing-associated respiratory infection (WARI) - Plan: ipratropium-albuterol (DUONEB) 0.5-2.5 (3) MG/3ML nebulizer solution 3 mL, DG Chest 2 View  Acute upper respiratory infection of multiple sites - Plan: DG Chest 2 View  Medication management  Allergic rhinitis, unspecified seasonality, unspecified trigger pred causes  Sugar up a nd " gets a clean  House" but helps  Chest.  rx given for over weekend can try 2-3 days instead of full 5 days     Sounds like rad with viral since better at home than at work  Or in day . Underlying obesity osa untreated and dm and ht  She may  Contact her allergist also if needed  Risk benefit of medication discussed.pt aware.   -Patient advised to return or notify health care team  if  new concerns arise.  Patient Instructions  Acts like acute viral respiratory infection triggering  Your wheezing  .  Consider 2 -5 days of prednisone if needed .   Can use  Albuterol every 6 hours  And consider adding other inhalers   mucinex  Plain and mucinex dm if need cough suppression.   Standley Brooking.  M.D.

## 2018-08-02 ENCOUNTER — Other Ambulatory Visit: Payer: Self-pay | Admitting: Internal Medicine

## 2018-08-04 ENCOUNTER — Telehealth: Payer: Self-pay | Admitting: *Deleted

## 2018-08-04 MED ORDER — BASAGLAR KWIKPEN 100 UNIT/ML ~~LOC~~ SOPN: 30.0000 [IU] | PEN_INJECTOR | Freq: Every day | SUBCUTANEOUS | 0 refills | Status: DC

## 2018-08-04 NOTE — Telephone Encounter (Signed)
Pt would like Basaglar insulin sent in please advise

## 2018-08-04 NOTE — Telephone Encounter (Signed)
Patient called after hours line yesterday reporting she needs Basaglar quick pin that has the insulin in it. The caller states her sugar is very high. Patient reports her sugar has been running 358 to 198, states she has PO medication for diabetes but it makes her sick and is currently not taking anything for diabetes, Caller states her PCP wrote scrip for farxiga that she has not filled at pharmacy yet, caller states she does not know if she wants to continue Basaglar insulin that she had previously or have new scrip filled for farxiga, reports she walked last night and that brought her sugar down and has watched what she ate today and that has helped as well. Pt reports she will just wait and call the doctor's office back in the morning to speak with her doctor. Reports no other symptoms.

## 2018-08-04 NOTE — Telephone Encounter (Signed)
She has not picked up farxiga yet

## 2018-08-04 NOTE — Telephone Encounter (Signed)
Ok to refill her insulin  But remingin her that this may note be enough to control her  Sugars  I would like her to take the insulin AND  the farxiga   Until it is controlled . Send in a new rx fof fraxiga if she needs this  But should use coupon for reduction in cost

## 2018-08-04 NOTE — Telephone Encounter (Signed)
Lvm letting pt know refill was sent

## 2018-08-18 ENCOUNTER — Other Ambulatory Visit: Payer: Self-pay | Admitting: Internal Medicine

## 2018-08-21 ENCOUNTER — Other Ambulatory Visit: Payer: Self-pay | Admitting: Internal Medicine

## 2018-08-25 ENCOUNTER — Other Ambulatory Visit: Payer: Self-pay | Admitting: Internal Medicine

## 2018-09-02 ENCOUNTER — Other Ambulatory Visit: Payer: Self-pay

## 2018-09-02 MED ORDER — BASAGLAR KWIKPEN 100 UNIT/ML ~~LOC~~ SOPN: PEN_INJECTOR | SUBCUTANEOUS | 1 refills | Status: DC

## 2018-10-18 ENCOUNTER — Other Ambulatory Visit: Payer: Self-pay | Admitting: Internal Medicine

## 2018-11-25 ENCOUNTER — Other Ambulatory Visit: Payer: Self-pay | Admitting: Internal Medicine

## 2018-11-29 ENCOUNTER — Other Ambulatory Visit: Payer: Self-pay | Admitting: Internal Medicine

## 2019-01-31 ENCOUNTER — Other Ambulatory Visit: Payer: Self-pay | Admitting: Internal Medicine

## 2019-03-15 ENCOUNTER — Other Ambulatory Visit: Payer: BC Managed Care – PPO

## 2019-03-15 ENCOUNTER — Telehealth (INDEPENDENT_AMBULATORY_CARE_PROVIDER_SITE_OTHER): Payer: BC Managed Care – PPO | Admitting: Internal Medicine

## 2019-03-15 ENCOUNTER — Other Ambulatory Visit: Payer: Self-pay

## 2019-03-15 ENCOUNTER — Encounter: Payer: Self-pay | Admitting: Internal Medicine

## 2019-03-15 DIAGNOSIS — M25512 Pain in left shoulder: Secondary | ICD-10-CM

## 2019-03-15 DIAGNOSIS — Z79899 Other long term (current) drug therapy: Secondary | ICD-10-CM | POA: Diagnosis not present

## 2019-03-15 DIAGNOSIS — E1169 Type 2 diabetes mellitus with other specified complication: Secondary | ICD-10-CM | POA: Diagnosis not present

## 2019-03-15 DIAGNOSIS — R42 Dizziness and giddiness: Secondary | ICD-10-CM

## 2019-03-15 DIAGNOSIS — R110 Nausea: Secondary | ICD-10-CM

## 2019-03-15 DIAGNOSIS — E669 Obesity, unspecified: Secondary | ICD-10-CM

## 2019-03-15 DIAGNOSIS — I10 Essential (primary) hypertension: Secondary | ICD-10-CM

## 2019-03-15 NOTE — Progress Notes (Signed)
Virtual Visit via Video Note  I connected with@ on 03/15/19 at  9:30 AM EST by a video enabled telemedicine application and verified that I am speaking with the correct person using two identifiers. Location patient: home Location provider:work  office Persons participating in the virtual visit: patient, provider  [place] national recommendations  regarding COVID 19 pandemic   video visit is advised over in office visit for this patient.  Patient aware  of the limitations of evaluation and management by telemedicine and  availability of in person appointments. and agreed to proceed.   HPI: [name] presents for video visit  SDA  For problems with her left shoulder over the last 2 to 4 weeks.  Denies any injury but has pain around the shoulder area radiates perhaps to the elbow is having difficulty raising her arm because of this and even picking up her coffee cup sometimes will drop it.  She denies however weakness or serious numbness in her hand.  There is no injury no history of same.  She did take some Tylenol 1 day Needs advice about next step in evaluation.  She has now been working from home for a while and has noted that her recurrent respiratory symptoms congestion cough and wheeze has significantly improved thus suggesting that her workspace environment was aggravating her respiratory symptoms.  She feels so much better in this regard.  She did have a problem with ants or insects and sprayed a whole lot of Bayer insect repellent around her work space in her home I believe a few weeks ago she noted that when she sits at her work space for a while she feels nauseous dizzy and hard to talk.  However when she is away she is fine when she travels to her weekend trips and any other space in the home she has tried to clean it up feels like it is exposure to the insecticide that is causing her symptoms and its only when she is located in that certain area.  Denies any symptoms at other  times denies vomiting.  She has not followed up about the sleep apnea In regard to her diabetes she states that the endocrinologist referred to do not want to see her that perhaps she has a history of cancellation that they decided they could not help her however she is going to find a new endocrinologist and plan appropriate follow-up. She is only taking Metformin once a day and not the insulin and not the [place]. States her morning blood sugar today was 110 and only has a rare sugar over 200.  This is significantly improved for her and she attributes this to changing her dietary intake.  And eating better choices.    ROS: See pertinent positives and negatives per HPI.  Past Medical History:  Diagnosis Date  . Allergic rhinitis   . Diabetes mellitus   . Hyperlipidemia   . Hypertension    x many years (since birht of her son at 2) Had workup with renal artery angiogram that showed no evidence for fibromuscular dysplasia ( no significant renal artery stenosis.) ECHO (8/11) EF 60-65%, mild LVH, no regional WMAs.  . Low back pain   . Morbid obesity ([place])   . OSA (obstructive sleep apnea) 12/28/2015    Past Surgical History:  Procedure Laterality Date  . ABDOMINAL HYSTERECTOMY     partial for fibroids  . TUBAL LIGATION      Family History  Problem Relation Age of Onset  .  Cancer Mother   . Hypertension Mother   . Other Father        died in MVA    Social History   Tobacco Use  . Smoking status: Never Smoker  . Smokeless tobacco: Never Used  Substance Use Topics  . Alcohol use: No    Alcohol/week: 0.0 standard drinks  . Drug use: No      Current Outpatient Medications:  .  albuterol (PROAIR HFA) 108 (90 Base) MCG/ACT inhaler, Inhale 1-2 puffs into the lungs 2 (two) times daily as needed for wheezing or shortness of breath., Disp: , Rfl:  .  Ascorbic Acid (VITAMIN C PO), Take 1 tablet by mouth at bedtime., Disp: , Rfl:  .  Azelastine-Fluticasone (DYMISTA) 137-50  MCG/ACT SUSP, Place 2 sprays into the nose daily., Disp: 23 g, Rfl: 5 .  carvedilol (COREG) 6.25 MG tablet, TAKE 1 TABLET (6.25 MG TOTAL) BY MOUTH 2 (TWO) TIMES DAILY WITH A MEAL. FOR HYPERTENSION (Patient not taking: Reported on 12/30/2017), Disp: 180 tablet, Rfl: 0 .  chlorpheniramine (CHLOR-TRIMETON) 4 MG tablet, Take 1 tablet (4 mg total) by mouth 2 (two) times daily as needed for allergies. (Patient not taking: Reported on 12/30/2017), Disp: 14 tablet, Rfl: 0 .  dapagliflozin propanediol (FARXIGA) 10 MG TABS tablet, Take 10 mg by mouth daily., Disp: 30 tablet, Rfl: 3 .  felodipine (PLENDIL) 10 MG 24 hr tablet, TAKE 1 TABLET (10 MG TOTAL) BY MOUTH DAILY. DUE FOR ANNUAL VISIT, Disp: 90 tablet, Rfl: 0 .  glucose blood (ACCU-CHEK GUIDE) test strip, Use as instructed to check sugar 3 times daily, Disp: 300 each, Rfl: 1 .  Insulin Glargine (BASAGLAR KWIKPEN) 100 UNIT/ML SOPN, INJECT 30 UNITS INTO THE SKIN AT BEDTIME., Disp: 45 pen, Rfl: 1 .  Insulin Pen Needle (BD PEN NEEDLE NANO U/F) 32G X 4 MM MISC, INJECT 16 UNITS DAILY AT BEDTIME, Disp: 100 each, Rfl: 5 .  KLOR-CON M20 20 MEQ tablet, TAKE 1 TABLET BY MOUTH EVERY DAY, Disp: 90 tablet, Rfl: 0 .  Lancets Misc. (ACCU-CHEK MULTICLIX LANCET DEV) KIT, Use as instructed to check sugar 3 times daily (Patient not taking: Reported on 12/30/2017), Disp: 1 each, Rfl: prn .  metFORMIN (GLUCOPHAGE-XR) 500 MG 24 hr tablet, TAKE 1 TAB BY MOUTH DAILY WITH BREAKFAST. CAN INCREASE TO TWICE A DAY . AFTER 3 WEEKS AS TOLERATED, Disp: 180 tablet, Rfl: 1 .  montelukast (SINGULAIR) 10 MG tablet, Take 1 tablet (10 mg total) by mouth at bedtime. (Patient not taking: Reported on 12/30/2017), Disp: 30 tablet, Rfl: 2 .  Multiple Vitamin (MULTIVITAMIN WITH MINERALS) TABS tablet, Take 1 tablet by mouth at bedtime. One a Day 50 plus, Disp: , Rfl:  .  ONE TOUCH LANCETS MISC, Use to check 3 times daily, Disp: 300 each, Rfl: 5 .  pantoprazole (PROTONIX) 40 MG tablet, TAKE 1 TABLET BY MOUTH  EVERY DAY, Disp: 90 tablet, Rfl: 0 .  potassium chloride (KLOR-CON) 20 MEQ packet, Take 40 mEq by mouth daily., Disp: , Rfl:  .  predniSONE (DELTASONE) 20 MG tablet, Take 1 tablet (20 mg total) by mouth 2 (two) times daily with a meal., Disp: 10 tablet, Rfl: 0  EXAM: BP Readings from Last 3 Encounters:  07/10/18 (!) 144/78  06/22/18 138/82  12/30/17 (!) 146/86    VITALS per patient if applicable: States blood pressure 111/91 blood sugar 139 this morning.  GENERAL: alert, oriented, appears well and in no acute distress  HEENT: atraumatic, conjunttiva clear, no obvious  abnormalities on inspection of external nose and ears  NECK: normal movements of the head and neck  LUNGS: on inspection no signs of respiratory distress, breathing rate appears normal, no obvious gross SOB, gasping or wheezing  CV: no obvious cyanosis  MS: moves all visible extremities points to the top of the left shoulder as the area of difficulty.  Full range of motion not evaluated.  PSYCH/NEURO: pleasant and cooperative, no obvious depression or anxiety, speech and thought processing grossly intact Lab Results  Component Value Date   WBC 5.1 05/25/2018   HGB 14.4 05/25/2018   HCT 43.0 05/25/2018   PLT 382.0 05/25/2018   GLUCOSE 211 (H) 05/25/2018   CHOL 189 05/25/2018   TRIG 143.0 05/25/2018   HDL 48.00 05/25/2018   LDLCALC 113 (H) 05/25/2018   ALT 17 12/30/2017   AST 13 12/30/2017   NA 137 05/25/2018   K 4.0 05/25/2018   CL 95 (L) 05/25/2018   CREATININE 0.99 05/25/2018   BUN 14 05/25/2018   CO2 34 (H) 05/25/2018   TSH 1.29 12/30/2017   INR 1.0 RATIO 07/08/2006   HGBA1C 10.5 (H) 05/25/2018   MICROALBUR 2.4 (H) 05/25/2018    ASSESSMENT AND PLAN:  Discussed the following assessment and plan:    ICD-10-CM   1. Left shoulder pain, unspecified chronicity  X45.038 Basic metabolic panel    CBC with Differential    Hemoglobin A1c    Hepatic function panel    TSH    T4, Free (Thyrox)     Ambulatory referral to Sports Medicine [facility]   with mobility limited ? capsulitis or bursitis ?   2. Essential hypertension  U82 Basic metabolic panel    CBC with Differential    Hemoglobin A1c    Hepatic function panel    TSH    T4, Free (Thyrox)  3. Diabetes mellitus type 2 in obese (HCC)  C00.34 Basic metabolic panel   J17.9 CBC with Differential    Hemoglobin A1c    Hepatic function panel    TSH    T4, Free (Thyrox)  4. Medication management  X50.569 Basic metabolic panel    CBC with Differential    Hemoglobin A1c    Hepatic function panel    TSH    T4, Free (Thyrox)  5. Nausea  V94.8 Basic metabolic panel    CBC with Differential    Hemoglobin A1c    Hepatic function panel    TSH    T4, Free (Thyrox)  6. Episode of dizziness  R42    related to insectiside  exposures?    sm discussed evaluation of mechanical shoulder problem we will do referral to sports medicine.  Evaluation shoulder   Dm ht etc fu resp doing so much better since at home   But hasnt fu with osa   Having episodes of  Nausea dizziness and speech when at work station  Related she thinks to over use of  Insecticide which certainly could be based on the timing of her symptoms however will check metabolic at this time.  Does not sound like she has hypersalivation or other autonomic symptoms at this time.  May need to change her work area and get help in this regard and follow-up if progressive symptoms.  Not sure that there is a easy accessible test to check confirmation of problem. Plan future lab as she is due for this while she is looking for a new endocrinologist. Encouraged her to continue diabetes control and try  increasing the Metformin to twice a day that she may need additional medication but healthy weight loss is more effective than medication.  Is optimistic that her respiratory symptoms are so much better since she has been at home.  Continue.  Counseled.   Expectant management and discussion of  plan and treatment with opportunity to ask questions and all were answered. The patient agreed with the plan and demonstrated an understanding of the instructions.   Advised to call back or seek an in-person evaluation if worsening  or having  further concerns .  I provided 25  minutes of non-face-to-face time during this encounter.   Shanon Ace, MD

## 2019-03-16 ENCOUNTER — Other Ambulatory Visit (INDEPENDENT_AMBULATORY_CARE_PROVIDER_SITE_OTHER): Payer: BC Managed Care – PPO

## 2019-03-16 ENCOUNTER — Other Ambulatory Visit: Payer: Self-pay

## 2019-03-16 DIAGNOSIS — E1169 Type 2 diabetes mellitus with other specified complication: Secondary | ICD-10-CM

## 2019-03-16 DIAGNOSIS — I10 Essential (primary) hypertension: Secondary | ICD-10-CM | POA: Diagnosis not present

## 2019-03-16 DIAGNOSIS — M25512 Pain in left shoulder: Secondary | ICD-10-CM

## 2019-03-16 DIAGNOSIS — R110 Nausea: Secondary | ICD-10-CM | POA: Diagnosis not present

## 2019-03-16 DIAGNOSIS — Z79899 Other long term (current) drug therapy: Secondary | ICD-10-CM

## 2019-03-16 DIAGNOSIS — E669 Obesity, unspecified: Secondary | ICD-10-CM

## 2019-03-16 LAB — CBC WITH DIFFERENTIAL/PLATELET
Basophils Absolute: 0.1 10*3/uL (ref 0.0–0.1)
Basophils Relative: 1 % (ref 0.0–3.0)
Eosinophils Absolute: 0.1 10*3/uL (ref 0.0–0.7)
Eosinophils Relative: 1.5 % (ref 0.0–5.0)
HCT: 40.7 % (ref 36.0–46.0)
Hemoglobin: 13.8 g/dL (ref 12.0–15.0)
Lymphocytes Relative: 38.7 % (ref 12.0–46.0)
Lymphs Abs: 2.1 10*3/uL (ref 0.7–4.0)
MCHC: 33.9 g/dL (ref 30.0–36.0)
MCV: 92.7 fl (ref 78.0–100.0)
Monocytes Absolute: 0.4 10*3/uL (ref 0.1–1.0)
Monocytes Relative: 7.4 % (ref 3.0–12.0)
Neutro Abs: 2.8 10*3/uL (ref 1.4–7.7)
Neutrophils Relative %: 51.4 % (ref 43.0–77.0)
Platelets: 381 10*3/uL (ref 150.0–400.0)
RBC: 4.39 Mil/uL (ref 3.87–5.11)
RDW: 12.5 % (ref 11.5–15.5)
WBC: 5.4 10*3/uL (ref 4.0–10.5)

## 2019-03-16 LAB — BASIC METABOLIC PANEL
BUN: 21 mg/dL (ref 6–23)
CO2: 30 mEq/L (ref 19–32)
Calcium: 8.9 mg/dL (ref 8.4–10.5)
Chloride: 94 mEq/L — ABNORMAL LOW (ref 96–112)
Creatinine, Ser: 0.96 mg/dL (ref 0.40–1.20)
GFR: 72.71 mL/min (ref >60.00)
Glucose, Bld: 230 mg/dL — ABNORMAL HIGH (ref 70–99)
Potassium: 3.4 mEq/L — ABNORMAL LOW (ref 3.5–5.1)
Sodium: 134 mEq/L — ABNORMAL LOW (ref 135–145)

## 2019-03-16 LAB — HEPATIC FUNCTION PANEL
ALT: 20 U/L (ref 0–35)
AST: 15 U/L (ref 0–37)
Albumin: 3.9 g/dL (ref 3.5–5.2)
Alkaline Phosphatase: 94 U/L (ref 39–117)
Bilirubin, Direct: 0.1 mg/dL (ref 0.0–0.3)
Total Bilirubin: 0.5 mg/dL (ref 0.2–1.2)
Total Protein: 7.2 g/dL (ref 6.0–8.3)

## 2019-03-16 LAB — T4, FREE: Free T4: 0.92 ng/dL (ref 0.60–1.60)

## 2019-03-16 LAB — HEMOGLOBIN A1C: Hgb A1c MFr Bld: 10.4 % — ABNORMAL HIGH (ref 4.6–6.5)

## 2019-03-16 LAB — TSH: TSH: 1.26 u[IU]/mL (ref 0.35–4.50)

## 2019-03-19 ENCOUNTER — Ambulatory Visit: Payer: Self-pay | Admitting: *Deleted

## 2019-03-19 NOTE — Telephone Encounter (Signed)
Message from Mathis Bud sent at 03/19/2019 4:48 PM EST  Summary: lab results    Patient is requesting a call back due to lab results. Office states NT can disclose.  If answer does not answer please leave a voicemail.  Call back [phone]          Pt given lab results per notes of Dr. Regis Bill on 03/18/19. Pt verbalized understanding. Pt agreeable to take extra Metformin(currently takes 500mg ) but would like to know if she could take 2-500 mg tabs at night. Pt states when she takes one in the morning and one at night it makes her really sick. Pt states she is agreeable to take [place] but will need a new prescription sent to [facility] on [address]. Pt can not find prescription that she was given previously. Pt would like for results to be mailed to address on file. Pt also states that she would like for a detailed message to be left on hr voicmail re: taking the the 2 doses of Metformin at night versus taking it twice a day. Pt can be contacted at [phone].

## 2019-03-22 ENCOUNTER — Telehealth: Payer: Self-pay | Admitting: *Deleted

## 2019-03-22 MED ORDER — DAPAGLIFLOZIN PROPANEDIOL 10 MG PO TABS: 10.0000 mg | ORAL_TABLET | Freq: Every day | ORAL | 3 refills | Status: DC

## 2019-03-22 NOTE — Telephone Encounter (Signed)
farxiga sent in please advise about metformin

## 2019-03-22 NOTE — Telephone Encounter (Signed)
Ok to take the metformin  2 at night  To try.

## 2019-03-22 NOTE — Telephone Encounter (Signed)
Pt has been notified okay to take metformin at night

## 2019-03-22 NOTE — Addendum Note (Signed)
Addended by: Modena Morrow R on: 03/22/2019 07:48 AM   Modules accepted: Orders

## 2019-03-22 NOTE — Telephone Encounter (Signed)
Copied from [place] [phone]. Topic: General - Call Back - No Documentation >> Mar 22, 2019  9:44 AM Richardo Priest, NT wrote: Reason for CRM: Pt called in stating she is returning a call from [facility] in regards to referral. Pt states she is off today, tomorrow, and Wednesday. Please advise.

## 2019-03-22 NOTE — Telephone Encounter (Signed)
This would be sports medicine. Will you forward to them please

## 2019-03-22 NOTE — Addendum Note (Signed)
Addended by: Modena Morrow R on: 03/22/2019 07:49 AM   Modules accepted: Orders

## 2019-03-22 NOTE — Telephone Encounter (Signed)
Returned call to pt and scheduled her to see Dr. Georgina Snell at [facility].

## 2019-03-23 ENCOUNTER — Ambulatory Visit: Payer: Self-pay

## 2019-03-23 ENCOUNTER — Other Ambulatory Visit: Payer: Self-pay

## 2019-03-23 ENCOUNTER — Ambulatory Visit (INDEPENDENT_AMBULATORY_CARE_PROVIDER_SITE_OTHER): Payer: BC Managed Care – PPO | Admitting: Family Medicine

## 2019-03-23 ENCOUNTER — Encounter: Payer: Self-pay | Admitting: Family Medicine

## 2019-03-23 VITALS — BP 158/90 | HR 85 | Ht 67.0 in | Wt 311.8 lb

## 2019-03-23 DIAGNOSIS — M25512 Pain in left shoulder: Secondary | ICD-10-CM

## 2019-03-23 DIAGNOSIS — M5416 Radiculopathy, lumbar region: Secondary | ICD-10-CM | POA: Diagnosis not present

## 2019-03-23 DIAGNOSIS — M7712 Lateral epicondylitis, left elbow: Secondary | ICD-10-CM

## 2019-03-23 DIAGNOSIS — M7542 Impingement syndrome of left shoulder: Secondary | ICD-10-CM

## 2019-03-23 NOTE — Patient Instructions (Addendum)
Please perform the exercise program that we have prepared for you and gone over in detail on a daily basis.  In addition to the handout you were provided you can access your program through: [email or web address]   Your unique program code is: [email or web address]  Do the exercises [name] reviews.  Attend PT.  Recheck with me in 3 weeks.  Work on diabetes control with Dr Regis Bill. Better blood sugar means better tissue healing.
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
## 2019-03-25 ENCOUNTER — Telehealth: Payer: Self-pay | Admitting: Internal Medicine

## 2019-03-25 NOTE — Telephone Encounter (Signed)
Copied from [place] [id number]. Topic: Quick Sport and exercise psychologist Patient (Clinic Use ONLY) >> Mar 24, 2019 11:50 AM Cle[place], Park Breed wrote: Reason for CRM:  Called to get her scheduled for physical therapy.  Transfer to the office if she would like to schedule.  Thanks! >> Mar 25, 2019 10:19 AM Leward Quan A wrote: Patient called back to ask stephanie if her insurance will cover for the PT or does she have to pay out of pocket. She also want to know if there was something available for tomorrow since she is off from work. Please call patient at Ph# [phone]

## 2019-04-09 ENCOUNTER — Telehealth: Payer: Self-pay | Admitting: Internal Medicine

## 2019-04-09 NOTE — Telephone Encounter (Signed)
Pt states she is going to talk to pharmacist

## 2019-04-09 NOTE — Telephone Encounter (Signed)
Copied from [place] [phone]. Topic: General - Other >> Apr 09, 2019 12:14 PM Keene Breath wrote: Reason for CRM: Patient would like the nurse to call her regarding her medication for Metformin and dapagliflozin propanediol (FARXIGA) 10 MG TABS tablet.  Patient stated she is concerned about taking the [place] because of the side effects.  Please advise and call patient back to discuss.  She has some questions and if she is unavailable, please leave a detailed message.  CB# [phone]

## 2019-04-09 NOTE — Telephone Encounter (Signed)
Pt couldn't set up appt till December 28th due to her work schedule

## 2019-04-09 NOTE — Telephone Encounter (Signed)
vidoe visit to go over issues   Unable to answer   Completely  Otherwise

## 2019-04-09 NOTE — Telephone Encounter (Signed)
Pt states she is wondering if she should take 2 metformins at night  While taking farxiga during day ? 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 Pt states 2 metformins upsets her stomach.

## 2019-04-12 ENCOUNTER — Telehealth: Payer: BC Managed Care – PPO | Admitting: Internal Medicine

## 2019-04-13 ENCOUNTER — Ambulatory Visit (INDEPENDENT_AMBULATORY_CARE_PROVIDER_SITE_OTHER): Payer: BC Managed Care – PPO | Admitting: Physical Therapy

## 2019-04-13 ENCOUNTER — Other Ambulatory Visit: Payer: Self-pay

## 2019-04-13 ENCOUNTER — Encounter: Payer: Self-pay | Admitting: Family Medicine

## 2019-04-13 ENCOUNTER — Encounter: Payer: Self-pay | Admitting: Physical Therapy

## 2019-04-13 ENCOUNTER — Ambulatory Visit (INDEPENDENT_AMBULATORY_CARE_PROVIDER_SITE_OTHER): Payer: BC Managed Care – PPO | Admitting: Family Medicine

## 2019-04-13 VITALS — BP 142/90 | HR 83 | Ht 67.0 in | Wt 314.6 lb

## 2019-04-13 DIAGNOSIS — M7712 Lateral epicondylitis, left elbow: Secondary | ICD-10-CM

## 2019-04-13 DIAGNOSIS — M5416 Radiculopathy, lumbar region: Secondary | ICD-10-CM | POA: Diagnosis not present

## 2019-04-13 DIAGNOSIS — M25512 Pain in left shoulder: Secondary | ICD-10-CM

## 2019-04-13 DIAGNOSIS — E669 Obesity, unspecified: Secondary | ICD-10-CM

## 2019-04-13 DIAGNOSIS — E1169 Type 2 diabetes mellitus with other specified complication: Secondary | ICD-10-CM | POA: Diagnosis not present

## 2019-04-13 DIAGNOSIS — M7542 Impingement syndrome of left shoulder: Secondary | ICD-10-CM | POA: Diagnosis not present

## 2019-04-13 DIAGNOSIS — M25522 Pain in left elbow: Secondary | ICD-10-CM

## 2019-04-13 NOTE — Therapy (Signed)
[facility] [address] [place], [place], [address] Phone: [phone]   Fax:  [phone]  Physical Therapy Evaluation  Patient Details  Name: [name] MRN: [id number] Date of Birth: Aug 17, 1960 Referring Provider (PT): Clementeen Graham   Encounter Date: 04/13/2019  PT End of Session - 04/13/19 1558    Visit Number  1    Number of Visits  12    Date for PT Re-Evaluation  05/25/19    Authorization Type  BCBS    PT Start Time  0845    PT Stop Time  0925    PT Time Calculation (min)  40 min    Activity Tolerance  Patient tolerated treatment well    Behavior During Therapy  [facility] for tasks assessed/performed       Past Medical History:  Diagnosis Date  . Allergic rhinitis   . Diabetes mellitus   . Hyperlipidemia   . Hypertension    x many years (since birht of her son at 35) Had workup with renal artery angiogram that showed no evidence for fibromuscular dysplasia ( no significant renal artery stenosis.) ECHO (8/11) EF 60-65%, mild LVH, no regional WMAs.  . Low back pain   . Morbid obesity (HCC)   . OSA (obstructive sleep apnea) 12/28/2015    Past Surgical History:  Procedure Laterality Date  . ABDOMINAL HYSTERECTOMY     partial for fibroids  . TUBAL LIGATION      There were no vitals filed for this visit.   Subjective Assessment - 04/13/19 0855    Subjective  Pt states pain in L shoulder and elbow. No incident to report, but states that she "flops" back into bed ~12 x/night, she has to get up due to sleep apnea, and thinks this has aggravated shoulder. She is R handed, works from [employer], on Animator. She states pain in L shoulder with elevation, reaching, and use of UE. Pain in elbow also worse with increased UE activity.    Limitations  Reading;Lifting;Writing;House hold activities    Patient Stated Goals  Decreased pain    Currently in Pain?  Yes    Pain Score  7     Pain Location  Shoulder    Pain  Orientation  Left    Pain Descriptors / Indicators  Aching    Pain Type  Acute pain    Pain Onset  More than a month ago    Pain Frequency  Intermittent    Aggravating Factors   lifting, reaching, carrying, computer work, Merchandiser, retail.    Pain Relieving Factors  rest    Multiple Pain Sites  Yes    Pain Score  5    Pain Location  Elbow    Pain Orientation  Left    Pain Descriptors / Indicators  Aching;Sore    Pain Type  Acute pain    Pain Onset  1 to 4 weeks ago    Pain Frequency  Intermittent    Aggravating Factors   use of arm, reaching, lifting, carrying, computer         [facility] PT Assessment - 04/13/19 1548      Assessment   Medical Diagnosis  L shoulder pain, L elbow Pain    Referring Provider (PT)  Clementeen Graham    Hand Dominance  Right    Prior Therapy  no      Balance Screen   Has the patient fallen in the past 6 months  No  Prior Function   Level of Independence  Independent      Cognition   Overall Cognitive Status  Within Functional Limits for tasks assessed      AROM   Overall AROM Comments  L shoulder: mild limitation for full elevation due to pain, L elbow: WNL    Left Shoulder Flexion  135 Degrees      Strength   Strength Assessment Site  Shoulder;Elbow    Right/Left Shoulder  Left    Left Shoulder Flexion  4-/5    Left Shoulder ABduction  4-/5    Left Shoulder Internal Rotation  4/5    Left Shoulder External Rotation  4/5    Right/Left Elbow  Left    Left Elbow Flexion  4/5    Left Elbow Extension  4/5      Palpation   Palpation comment  Pain at lateral elbow, and brachioradialis,       Special Tests   Other special tests  Pain with resisted wrist pron/sup,  Pain with resisted shoulder IR/ER, painful arc with elevation,                 Objective measurements completed on examination: See above findings.      [facility] Adult PT Treatment/Exercise - 04/13/19 0001      Exercises   Exercises  Shoulder;Elbow      Elbow Exercises   Other  elbow exercises  Wrist flexion stretch 30 sec x3;       Shoulder Exercises: Supine   External Rotation  10 reps;AAROM    External Rotation Limitations  cane    Flexion  AAROM;10 reps    Flexion Limitations  cane    Other Supine Exercises  ER AROM, 2 lb x15;       Shoulder Exercises: Seated   Other Seated Exercises  Scap Squeeze x15             PT Education - 04/13/19 1618    Education Details  PT POC, Exam findings, initial HEP    Person(s) Educated  Patient    Methods  Explanation;Demonstration;Verbal cues;Handout;Tactile cues    Comprehension  Verbalized understanding;Returned demonstration;Verbal cues required;Need further instruction       PT Short Term Goals - 04/13/19 1620      PT SHORT TERM GOAL #1   Title  Pt to be independent wtih initial HEP    Time  2    Period  Weeks    Status  New    Target Date  04/27/19      PT SHORT TERM GOAL #2   Title  Pt to report decreased pain in L shoulder and elbow, to 4/10 with activity    Time  2    Period  Weeks    Status  New    Target Date  04/27/19        PT Long Term Goals - 04/13/19 1621      PT LONG TERM GOAL #1   Title  Pt to report decreased pain in L shoulder and elbow to 0-2/10 with activity    Time  6    Period  Weeks    Status  New    Target Date  05/25/19      PT LONG TERM GOAL #2   Title  Pt to demo full ROM for L shoulder without pain, to improve ability for reaching, lifting, and IADLS.    Time  6    Period  Weeks  Status  New    Target Date  05/25/19      PT LONG TERM GOAL #3   Title  Pt to demo increased strength of L shoulder, to at least 4+/5 to improve stability and ability for reaching, lifting, carrying, and IADLs.    Time  6    Period  Weeks    Status  New    Target Date  05/25/19      PT LONG TERM GOAL #4   Title  Pt to be independent wtih final HEP for shoulder and elbow.    Time  6    Period  Weeks    Status  New    Target Date  05/25/19             Plan -  04/13/19 1630    Clinical Impression Statement  Pt presents with primary complaint of increased pain in L shoulder and L elbow. Shoulder pain consistent with impingement, and elbow pain consistent with epicondylitis. Pt with decreased ability for full ROM of L shoulder due to pain, and decreased strength in shoulder and elbow. Pt with decreased ability for ful functional activities, reaching, lifting, ADLs and IADLS. Pt to benefit from skilled PT to improve deficits and pain.    Examination-Activity Limitations  Locomotion Level;Reach Overhead;Sleep;Carry;Lift    Examination-Participation Restrictions  Meal Prep;Cleaning;Personal Finances;Community Activity;Driving;Shop;Laundry    Stability/Clinical Decision Making  Stable/Uncomplicated    Clinical Decision Making  Low    Rehab Potential  Good    PT Frequency  2x / week    PT Duration  6 weeks    PT Treatment/Interventions  ADLs/Self Care Home Management;Cryotherapy;Electrical Stimulation;DME Instruction;Ultrasound;Traction;Moist Heat;Iontophoresis 4mg /ml Dexamethasone;Functional mobility training;Therapeutic activities;Therapeutic exercise;Neuromuscular re-education;Manual techniques;Patient/family education;Passive range of motion;Dry needling;Joint Manipulations;Spinal Manipulations;Taping    PT Home Exercise Plan  [email or web address]    Consulted and Agree with Plan of Care  Patient       Patient will benefit from skilled therapeutic intervention in order to improve the following deficits and impairments:  Decreased range of motion, Impaired UE functional use, Increased muscle spasms, Decreased endurance, Decreased activity tolerance, Pain, Improper body mechanics, Impaired flexibility, Decreased strength  Visit Diagnosis: Acute pain of left shoulder  Pain in left elbow     Problem List Patient Active Problem List   Diagnosis Date Noted  . Colon cancer screening 06/26/2016  . OSA (obstructive sleep apnea) 12/28/2015  . Visit for preventive  health examination 03/22/2014  . Essential hypertension, benign 03/22/2014  . Recurrent bronchospasm 03/22/2014  . Influenza vaccination declined 03/22/2014  . Diabetes mellitus without complication ([place]) 98/33/8250  . Lower extremity edema 02/27/2013  . Knee pain 02/27/2013  . Allergic conjunctivitis and rhinitis 06/22/2012  . Hyperaldosteronism ([place]) 02/25/2011  . Diabetes mellitus type 2 in obese ([place]) 01/15/2011  . FOOT PAIN, LEFT 11/17/2009  . Chronic cough 10/13/2009  . FREQUENCY, URINARY 09/09/2008  . ADVERSE REACTION TO MEDICATION 12/02/2007  . HYPOKALEMIA 08/19/2007  . Sleep apnea 07/21/2007  . Nocturia 07/07/2007  . HYPERGLYCEMIA 07/07/2007  . HYPERLIPIDEMIA 12/29/2006  . Allergic rhinitis 12/29/2006  . LOW BACK PAIN 12/29/2006  . MORBID OBESITY 12/25/2006  . Essential hypertension 12/25/2006    [name], PT, DPT 4:32 PM  04/13/19    [place], [place], [address] Phone: [phone]   Fax:  [phone]  Name: NABEEHA BADERTSCHER MRN: [id number] Date of Birth: 03/31/1961

## 2019-04-13 NOTE — Patient Instructions (Signed)
Access Code: [email or web address]  URL: [email or web address]  Date: 04/13/2019  Prepared by: [name]   Exercises Supine Shoulder Flexion with Dowel - 10 reps - 1 sets - 2x daily Supine Shoulder External Rotation with Dowel - 10 reps - 1 sets - 2x daily Supine Single Arm Shoulder External Rotation AROM - 10 reps - 1 sets - 2x daily Seated Scapular Retraction - 10 reps - 1 sets - 3x daily Standing Wrist Flexion Stretch - 3 reps - 30 hold - 3-4x daily

## 2019-04-13 NOTE — Patient Instructions (Addendum)
Thank you for coming in today. Attend PT.  Recheck in 1 month.  Return sooner if needed.   Ok to take tylenol for pain. Typically dose is 1000mg  every 6 hours.  I would recommend taking about 1 hr prior to PT exercise.    We're moving!  Dr. Clovis Riley new office will be located at [address] on the 1st floor.  This location is across the street from the [employer] and in the same complex as the [facility] and [employer]  Our new office phone number will be [phone].  We anticipate beginning to see patients at the [facility] office in early December 2020.

## 2019-04-13 NOTE — Progress Notes (Signed)
I, [name], LAT, ATC, am serving as scribe for Dr. Lynne Park.  [name] is a 58 y.o. female who presents to [facility] today for f/u of L shoulder, L elbow and low back pain.  Pain was thought to be due to subacromial bursitis, lateral epicondylitis, and lumbar radiculopathy.  She was last seen on 03/23/19 by Dr. Georgina Park and was referred to outpatient PT and given a HEP consisting of shoulder AAROM and shoulder Theraband strengthening (ER/IR, Ext, Rows and horizontal aBd).  Since her last visit, pt reports that her L shoulder is a little better but notes that it is still "miserably painful" at times.  She con't to not be able to actively move her L shoulder above 90 deg and con't to notice weakness/lack of strength in her L hand and L UE.  She has only done her HEP a few times and notes that the shoulder ROM exercises hurt but are helpful.  She has her first PT appt w/ [name] this morning at 8:45 am.      ROS:  As above  Exam:  BP (!) 142/90 (BP Location: Left Arm, Patient Position: Sitting, Cuff Size: Large)   Pulse 83   Ht _0  (1.702 m)   Wt (!) 314 lb 9.6 oz (142.7 kg)   SpO2 94%   BMI 49.27 kg/m  Wt Readings from Last 5 Encounters:  04/13/19 (!) 314 lb 9.6 oz (142.7 kg)  03/23/19 (!) 311 lb 12.8 oz (141.4 kg)  07/10/18 (!) 312 lb 9.6 oz (141.8 kg)  06/22/18 (!) 310 lb 8 oz (140.8 kg)  12/30/17 (!) 313 lb 2 oz (142 kg)   General: Well Developed, well nourished, and in no acute distress.  Neuro/Psych: Alert and oriented x3, extra-ocular muscles intact, able to move all 4 extremities, sensation grossly intact. Skin: Warm and dry, no rashes noted.  Respiratory: Not using accessory muscles, speaking in full sentences, trachea midline.  Cardiovascular: Pulses palpable, no extremity edema. Abdomen: Does not appear distended. MSK:  Left shoulder: Normal-appearing Nontender. Range of motion: Active abduction limited to 90 degrees passive full.  External rotation full. Internal rotation to lumbar spine. Strength diminished abduction.  Normal otherwise. Positive Hawkins and Neer's test.  L-spine: Nontender midline.  Normal motion.  Negative slump test.  Lower extremity strength reflexes and sensation are intact throughout.     Assessment and Plan: 58 y.o. female with  Left shoulder pain: Subacromial bursitis/impingement.  Doubtful for adhesive capsulitis despite her diabetes as she has good passive range of motion.  Plan to proceed with first episode of physical therapy today.  Recheck in 1 month.  Offered subacromial injection patient declined at this time but was willing to consider in the future.  Lumbosacral radiculopathy.  Likely to benefit from physical therapy as well.  Recheck in 1 month.  If not improving next step will be imaging and planning for epidural steroid injection.  Lateral epicondylitis.  Improving with home exercises a bit.  Again physical therapy will be helpful.  This is a lesser issue watchful waiting recheck in 1 month as well.  Additionally discussed her diabetes a bit.  This is obviously a risk factor for adhesive capsulitis.  Her primary care provider has prescribed Farxiga.  Patient was a bit worried to take it.  Offered reassurance.   Historical information moved to improve visibility of documentation.  Past Medical History:  Diagnosis Date  . Allergic rhinitis   . Diabetes mellitus   .  Hyperlipidemia   . Hypertension    x many years (since birht of her son at 54) Had workup with renal artery angiogram that showed no evidence for fibromuscular dysplasia ( no significant renal artery stenosis.) ECHO (8/11) EF 60-65%, mild LVH, no regional WMAs.  . Low back pain   . Morbid obesity ([name])   . OSA (obstructive sleep apnea) 12/28/2015   Past Surgical History:  Procedure Laterality Date  . ABDOMINAL HYSTERECTOMY     partial for fibroids  . TUBAL LIGATION     Social History   Tobacco Use  .  Smoking status: Never Smoker  . Smokeless tobacco: Never Used  Substance Use Topics  . Alcohol use: No    Alcohol/week: 0.0 standard drinks   family history includes Cancer in her mother; Hypertension in her mother; Other in her father.  Medications: Current Outpatient Medications  Medication Sig Dispense Refill  . albuterol (PROAIR HFA) 108 (90 Base) MCG/ACT inhaler Inhale 1-2 puffs into the lungs 2 (two) times daily as needed for wheezing or shortness of breath.    . Ascorbic Acid (VITAMIN C PO) Take 1 tablet by mouth at bedtime.    . Azelastine-Fluticasone (DYMISTA) 137-50 MCG/ACT SUSP Place 2 sprays into the nose daily. 23 g 5  . carvedilol (COREG) 6.25 MG tablet TAKE 1 TABLET (6.25 MG TOTAL) BY MOUTH 2 (TWO) TIMES DAILY WITH A MEAL. FOR HYPERTENSION 180 tablet 0  . chlorpheniramine (CHLOR-TRIMETON) 4 MG tablet Take 1 tablet (4 mg total) by mouth 2 (two) times daily as needed for allergies. 14 tablet 0  . felodipine (PLENDIL) 10 MG 24 hr tablet TAKE 1 TABLET (10 MG TOTAL) BY MOUTH DAILY. DUE FOR ANNUAL VISIT 90 tablet 0  . glucose blood (ACCU-CHEK GUIDE) test strip Use as instructed to check sugar 3 times daily 300 each 1  . Insulin Glargine (BASAGLAR KWIKPEN) 100 UNIT/ML SOPN INJECT 30 UNITS INTO THE SKIN AT BEDTIME. 45 pen 1  . Insulin Pen Needle (BD PEN NEEDLE NANO U/F) 32G X 4 MM MISC INJECT 16 UNITS DAILY AT BEDTIME 100 each 5  . KLOR-CON M20 20 MEQ tablet TAKE 1 TABLET BY MOUTH EVERY DAY 90 tablet 0  . Lancets Misc. (ACCU-CHEK MULTICLIX LANCET DEV) KIT Use as instructed to check sugar 3 times daily 1 each prn  . metFORMIN (GLUCOPHAGE-XR) 500 MG 24 hr tablet TAKE 1 TAB BY MOUTH DAILY WITH BREAKFAST. CAN INCREASE TO TWICE A DAY . AFTER 3 WEEKS AS TOLERATED 180 tablet 1  . Multiple Vitamin (MULTIVITAMIN WITH MINERALS) TABS tablet Take 1 tablet by mouth at bedtime. One a Day 50 plus    . ONE TOUCH LANCETS MISC Use to check 3 times daily 300 each 5  . pantoprazole (PROTONIX) 40 MG  tablet TAKE 1 TABLET BY MOUTH EVERY DAY 90 tablet 0  . dapagliflozin propanediol (FARXIGA) 10 MG TABS tablet Take 10 mg by mouth daily. (Patient not taking: Reported on 04/13/2019) 30 tablet 3  . montelukast (SINGULAIR) 10 MG tablet Take 1 tablet (10 mg total) by mouth at bedtime. (Patient not taking: Reported on 12/30/2017) 30 tablet 2  . potassium chloride (KLOR-CON) 20 MEQ packet Take 40 mEq by mouth daily.    . predniSONE (DELTASONE) 20 MG tablet Take 1 tablet (20 mg total) by mouth 2 (two) times daily with a meal. (Patient not taking: Reported on 04/13/2019) 10 tablet 0   No current facility-administered medications for this visit.    Allergies  Allergen Reactions  .  Candesartan Cilexetil Other (See Comments)    Reaction to Atacand - possibly drowsiness or diarrhea or nausea  . Codeine Hives, Itching and Swelling    Possible throat swelling - pt does not recall this reaction  . Ibuprofen Hives  . Telmisartan Other (See Comments)    Reaction to Micardis - possibly drowsiness or diarrhea or nausea  . Zithromax [Azithromycin] Diarrhea           Discussed warning signs or symptoms. Please see discharge instructions. Patient expresses understanding.  The above documentation has been reviewed and is accurate and complete [name]

## 2019-04-28 ENCOUNTER — Ambulatory Visit: Payer: Self-pay

## 2019-04-28 NOTE — Telephone Encounter (Signed)
Incoming call from Patient stating that she will be starting oral  Antibiotic  Due to the fact she is having teeth pulled.  Always experience diarrhea , when on antibiotics.  Has had 3 stools today.  Provided care advice encouraged to keep self well hydrated.  On set was this morning.  Mixture of water and loose stool.  Reports drinking water and voiding.  Recommended Imodium.  Patient voiced understanding.             Reason for Disposition . [1] MILD diarrhea (e.g., 1-3 or more stools than normal in past 24 hours) without known cause AND [2] present >  7 days  Answer Assessment - Initial Assessment Questions 1. DIARRHEA SEVERITY: "How bad is the diarrhea?" "How many extra stools have you had in the past 24 hours than normal?"    - NO DIARRHEA (SCALE 0)   - MILD (SCALE 1-3): Few loose or mushy BMs; increase of 1-3 stools over normal daily number of stools; mild increase in ostomy output.   -  MODERATE (SCALE 4-7): Increase of 4-6 stools daily over normal; moderate increase in ostomy output. * SEVERE (SCALE 8-10; OR 'WORST POSSIBLE'): Increase of 7 or more stools daily over normal; moderate increase in ostomy output; incontinence.     1 last night 3 today 2. ONSET: "When did the diarrhea begin?"     This morning.   3. BM CONSISTENCY: "How loose or watery is the diarrhea?"      Mixture of both 4. VOMITING: "Are you also vomiting?" If so, ask: "How many times in the past 24 hours?"     denies 5. ABDOMINAL PAIN: "Are you having any abdominal pain?" If yes: "What does it feel like?" (e.g., crampy, dull, intermittent, constant)      6. ABDOMINAL PAIN SEVERITY: If present, ask: "How bad is the pain?"  (e.g., Scale 1-10; mild, moderate, or severe)   - MILD (1-3): doesn't interfere with normal activities, abdomen soft and not tender to touch    - MODERATE (4-7): interferes with normal activities or awakens from sleep, tender to touch    - SEVERE (8-10): excruciating pain, doubled over, unable  to do any normal activities      denies 7. ORAL INTAKE: If vomiting, "Have you been able to drink liquids?" "How much fluids have you had in the past 24 hours?"    denies 8. HYDRATION: "Any signs of dehydration?" (e.g., dry mouth [not just dry lips], too weak to stand, dizziness, new weight loss) "When did you last urinate?"     One hour ago 9. EXPOSURE: "Have you traveled to a foreign country recently?" "Have you been exposed to anyone with diarrhea?" "Could you have eaten any food that was spoiled?"     denies 10. ANTIBIOTIC USE: "Are you taking antibiotics now or have you taken antibiotics in the past 2 months?"      Started yesterday 11. OTHER SYMPTOMS: "Do you have any other symptoms?" (e.g., fever, blood in stool)      Denies  12. PREGNANCY: "Is there any chance you are pregnant?" "When was your last menstrual period?"      na  Protocols used: [facility]

## 2019-04-28 NOTE — Telephone Encounter (Signed)
fyi

## 2019-04-28 NOTE — Telephone Encounter (Signed)
Message Routed to PCP CMA 

## 2019-05-02 NOTE — Progress Notes (Signed)
Virtual Visit via Video Note  I connected with@ on 12 28 20at 10:00 AM EST by a video enabled telemedicine application and verified that I am speaking with the correct person using two identifiers. Location patient: home Location provider: home office Persons participating in the virtual visit: patient, provider  [place] national recommendations  regarding COVID 19 pandemic   video visit is advised over in office visit for this patient.  Patient aware  of the limitations of evaluation and management by telemedicine and  availability of in person appointments. and agreed to proceed.   HPI: [name] presents for video visit regarding questions about medications prescribed. See notes and phone calls. She read the side effects of Farxiga and had some concerns because of the question about dizziness and sleepiness and should she take it with her Metformin. Blood pressure is somewhat better in the 148 range she has not started the carvedilol yet she is on potassium and Plendil.  She is still working from home still feels that her respiratory condition is better when she works in home but did get a slight cough after seeing the dentist for a tooth extraction.  She is now on amoxicillin with some slight diarrhea but is controlled by Imodium.    ROS: See pertinent positives and negatives per HPI. No new neuro sx  Has nocturia   Has osa but no cpap since insurance took back  Apparatus since she wasn't using consistently   Past Medical History:  Diagnosis Date  . Allergic rhinitis   . Diabetes mellitus   . Hyperlipidemia   . Hypertension    x many years (since birht of her son at 57) Had workup with renal artery angiogram that showed no evidence for fibromuscular dysplasia ( no significant renal artery stenosis.) ECHO (8/11) EF 60-65%, mild LVH, no regional WMAs.  . Low back pain   . Morbid obesity ([place])   . OSA (obstructive sleep apnea) 12/28/2015    Past Surgical History:   Procedure Laterality Date  . ABDOMINAL HYSTERECTOMY     partial for fibroids  . TUBAL LIGATION      Family History  Problem Relation Age of Onset  . Cancer Mother   . Hypertension Mother   . Other Father        died in MVA    Social History   Tobacco Use  . Smoking status: Never Smoker  . Smokeless tobacco: Never Used  Substance Use Topics  . Alcohol use: No    Alcohol/week: 0.0 standard drinks  . Drug use: No      Current Outpatient Medications:  .  albuterol (PROAIR HFA) 108 (90 Base) MCG/ACT inhaler, Inhale 1-2 puffs into the lungs 2 (two) times daily as needed for wheezing or shortness of breath., Disp: , Rfl:  .  Ascorbic Acid (VITAMIN C PO), Take 1 tablet by mouth at bedtime., Disp: , Rfl:  .  Azelastine-Fluticasone (DYMISTA) 137-50 MCG/ACT SUSP, Place 2 sprays into the nose daily., Disp: 23 g, Rfl: 5 .  carvedilol (COREG) 6.25 MG tablet, TAKE 1 TABLET (6.25 MG TOTAL) BY MOUTH 2 (TWO) TIMES DAILY WITH A MEAL. FOR HYPERTENSION, Disp: 180 tablet, Rfl: 0 .  chlorpheniramine (CHLOR-TRIMETON) 4 MG tablet, Take 1 tablet (4 mg total) by mouth 2 (two) times daily as needed for allergies., Disp: 14 tablet, Rfl: 0 .  dapagliflozin propanediol (FARXIGA) 10 MG TABS tablet, Take 10 mg by mouth daily. (Patient not taking: Reported on 04/13/2019), Disp: 30 tablet, Rfl:  3 .  felodipine (PLENDIL) 10 MG 24 hr tablet, TAKE 1 TABLET (10 MG TOTAL) BY MOUTH DAILY. DUE FOR ANNUAL VISIT, Disp: 90 tablet, Rfl: 0 .  glucose blood (ACCU-CHEK GUIDE) test strip, Use as instructed to check sugar 3 times daily, Disp: 300 each, Rfl: 1 .  Insulin Glargine (BASAGLAR KWIKPEN) 100 UNIT/ML SOPN, INJECT 30 UNITS INTO THE SKIN AT BEDTIME., Disp: 45 pen, Rfl: 1 .  Insulin Pen Needle (BD PEN NEEDLE NANO U/F) 32G X 4 MM MISC, INJECT 16 UNITS DAILY AT BEDTIME, Disp: 100 each, Rfl: 5 .  KLOR-CON M20 20 MEQ tablet, TAKE 1 TABLET BY MOUTH EVERY DAY, Disp: 90 tablet, Rfl: 0 .  Lancets Misc. (ACCU-CHEK MULTICLIX  LANCET DEV) KIT, Use as instructed to check sugar 3 times daily, Disp: 1 each, Rfl: prn .  metFORMIN (GLUCOPHAGE-XR) 500 MG 24 hr tablet, TAKE 1 TAB BY MOUTH DAILY WITH BREAKFAST. CAN INCREASE TO TWICE A DAY . AFTER 3 WEEKS AS TOLERATED, Disp: 180 tablet, Rfl: 1 .  montelukast (SINGULAIR) 10 MG tablet, Take 1 tablet (10 mg total) by mouth at bedtime. (Patient not taking: Reported on 12/30/2017), Disp: 30 tablet, Rfl: 2 .  Multiple Vitamin (MULTIVITAMIN WITH MINERALS) TABS tablet, Take 1 tablet by mouth at bedtime. One a Day 50 plus, Disp: , Rfl:  .  ONE TOUCH LANCETS MISC, Use to check 3 times daily, Disp: 300 each, Rfl: 5 .  pantoprazole (PROTONIX) 40 MG tablet, TAKE 1 TABLET BY MOUTH EVERY DAY, Disp: 90 tablet, Rfl: 0 .  potassium chloride (KLOR-CON) 20 MEQ packet, Take 40 mEq by mouth daily., Disp: , Rfl:  .  predniSONE (DELTASONE) 20 MG tablet, Take 1 tablet (20 mg total) by mouth 2 (two) times daily with a meal. (Patient not taking: Reported on 04/13/2019), Disp: 10 tablet, Rfl: 0  EXAM: BP Readings from Last 3 Encounters:  04/13/19 (!) 142/90  03/23/19 (!) 158/90  07/10/18 (!) 144/78    VITALS per patient if applicable:  GENERAL: alert, oriented, appears well and in no acute distress  HEENT: atraumatic, conjunttiva clear, no obvious abnormalities on inspection of external nose and ears  NECK: normal movements of the head and neck  LUNGS: on inspection no signs of respiratory distress, breathing rate appears normal, no obvious gross SOB, gasping or wheezing  CV: no obvious cyanosi  PSYCH/NEURO: pleasant and cooperative, no obvious depression or anxiety, speech and thought processing grossly intact Lab Results  Component Value Date   WBC 5.4 03/16/2019   HGB 13.8 03/16/2019   HCT 40.7 03/16/2019   PLT 381.0 03/16/2019   GLUCOSE 230 (H) 03/16/2019   CHOL 189 05/25/2018   TRIG 143.0 05/25/2018   HDL 48.00 05/25/2018   LDLCALC 113 (H) 05/25/2018   ALT 20 03/16/2019   AST 15  03/16/2019   NA 134 (L) 03/16/2019   K 3.4 (L) 03/16/2019   CL 94 (L) 03/16/2019   CREATININE 0.96 03/16/2019   BUN 21 03/16/2019   CO2 30 03/16/2019   TSH 1.26 03/16/2019   INR 1.0 RATIO 07/08/2006   HGBA1C 10.4 (H) 03/16/2019   MICROALBUR 2.4 (H) 05/25/2018    ASSESSMENT AND PLAN:  Discussed the following assessment and plan:    ICD-10-CM   1. Diabetes mellitus type 2 in obese (HCC)  E11.69    E66.9    only on  lower does metformin disc risks benefit side effects of meds   2. Uncontrolled type 2 diabetes mellitus with hyperglycemia ([place])  E11.65   3. Essential hypertension  I10    not at goal at this time  hasnt added the carvedilol yet will see if faxiga effect firt   4. Medication management  Z79.899   5. OSA (obstructive sleep apnea)  G47.33     Counseled.  In regard to medication.  Agree with only adding 1 medication at a time to see as tolerated if has an acute illness at risk of dehydration could hold the [place] and Metformin at that time. Begin the [place] and see how blood pressure is doing over the next 2 to 4 weeks send in readings.  If not controlled will be adding the carvedilol that she has at home but has not taken yet. Although blood pressure is improved since she has been working at home it is not at goal. She is under treatment for a tooth extraction infection on amoxicillin and Imodium Monitor the minor cough that she has had since her procedure. At some point she may want to check with insurance about how to get back on a trial of CPAP for her sleep apnea.  She has not recently pursued looking for other endocrinologist as we had discussed in the past    Expectant management and discussion of plan and treatment with opportunity to ask questions and all were answered. The patient agreed with the plan and demonstrated an understanding of the instructions.   Advised to call back or seek an in-person evaluation if worsening  or having  further concerns  . Return for when planned after beginning meds  as planned .  Shanon Ace, MD

## 2019-05-03 ENCOUNTER — Other Ambulatory Visit: Payer: Self-pay

## 2019-05-03 ENCOUNTER — Encounter: Payer: Self-pay | Admitting: Internal Medicine

## 2019-05-03 ENCOUNTER — Telehealth (INDEPENDENT_AMBULATORY_CARE_PROVIDER_SITE_OTHER): Payer: BC Managed Care – PPO | Admitting: Internal Medicine

## 2019-05-03 DIAGNOSIS — E1165 Type 2 diabetes mellitus with hyperglycemia: Secondary | ICD-10-CM

## 2019-05-03 DIAGNOSIS — E669 Obesity, unspecified: Secondary | ICD-10-CM

## 2019-05-03 DIAGNOSIS — G4733 Obstructive sleep apnea (adult) (pediatric): Secondary | ICD-10-CM

## 2019-05-03 DIAGNOSIS — I10 Essential (primary) hypertension: Secondary | ICD-10-CM

## 2019-05-03 DIAGNOSIS — Z79899 Other long term (current) drug therapy: Secondary | ICD-10-CM | POA: Diagnosis not present

## 2019-05-03 DIAGNOSIS — E1169 Type 2 diabetes mellitus with other specified complication: Secondary | ICD-10-CM | POA: Diagnosis not present

## 2019-05-06 ENCOUNTER — Telehealth: Payer: Self-pay | Admitting: *Deleted

## 2019-05-06 NOTE — Telephone Encounter (Signed)
Tried to call pt but no answer. Called to advise pt that due to the holidays the providers do not have any more virtual availability. Left a detailed message advising pt that Rx request will still be sent to Dr.Panosh and for pt to go to the local UC or ED if [place] worsen or persist.

## 2019-05-06 NOTE — Telephone Encounter (Signed)
I called the pt for more information as [name] is out of the office.  Patient stated she did not intend to send a message to our office about her shoulder as she wanted to know if Prednisone be sent in to [facility] for bronchitis due to her having recurrent shortness of breath, does not feel this is COVID, complains of a "funny feeling in her chest" that she gets every year AND she wanted to know if it is safe to take Prednisone with Amoxicillin that she is currently taking for her tooth?  Message sent to Dr Regis Bill.

## 2019-05-06 NOTE — Telephone Encounter (Signed)
Copied from [place] [id number]. Topic: General - Other >> May 06, 2019 12:53 PM Percell Belt A wrote: Reason for CRM: pt called in and stated she had an appt on 12/28 , she  mentioned that she had a cough.  She stated that she feels like she is getting bronchitis.  She stated that she is taking amoxicillin for a tooth that she is having work done on next week.  She would like to know if Dr Regis Bill would call in some prednisone for her to help keep this out of her chest ?    Best number  [phone] [facility] to whole message on her [id number] - [place], [place]  Phone:  [phone]

## 2019-05-06 NOTE — Telephone Encounter (Signed)
Copied from [place] [id number]. Topic: General - Call Back - No Documentation >> May 06, 2019  1:12 PM Erick Blinks wrote: Pt wants call back from nurse regarding diagnosis of shoulder.  [phone] VM available

## 2019-05-11 MED ORDER — ALBUTEROL SULFATE HFA 108 (90 BASE) MCG/ACT IN AERS: 1.0000 | INHALATION_SPRAY | Freq: Two times a day (BID) | RESPIRATORY_TRACT | 0 refills | Status: DC | PRN

## 2019-05-11 NOTE — Telephone Encounter (Signed)
Pt returned call and stated she could not understand the message that was left for her. Pt requests call back and asked that if a message has to be left to please speak slowly so she can understand.

## 2019-05-11 NOTE — Telephone Encounter (Signed)
Can refill albuterol hfa x 1  ( other option instead of  resp clinic would be to  Get appt with pulmonary team but  They may  Not see her in person  Either )

## 2019-05-11 NOTE — Telephone Encounter (Signed)
Lvm for pt to call back to schedule for respiratory clinic for Friday crm created okay to disclose

## 2019-05-11 NOTE — Telephone Encounter (Signed)
Please advise 

## 2019-05-11 NOTE — Telephone Encounter (Signed)
Pt notified inhaler has been sent in

## 2019-05-11 NOTE — Telephone Encounter (Signed)
Pt called to see what is going on with this.  Pt states that she has been waiting on this since before New Years.

## 2019-05-11 NOTE — Addendum Note (Signed)
Addended by: Raiford Simmonds R on: 05/11/2019 04:21 PM   Modules accepted: Orders

## 2019-05-11 NOTE — Telephone Encounter (Signed)
I advise she be seen in respiratory clinic because I dont feel comfortable pres cribing steroid and  meds  In her situation

## 2019-05-11 NOTE — Telephone Encounter (Signed)
Pt has been scheduled for respiratory pt didn't want to go until Monday. Pt is asking for albuterol inhaler to be filled. Please advise we were not the last ones to fill

## 2019-05-17 ENCOUNTER — Ambulatory Visit: Payer: BC Managed Care – PPO

## 2019-05-25 ENCOUNTER — Telehealth: Payer: Self-pay | Admitting: Internal Medicine

## 2019-05-25 NOTE — Telephone Encounter (Signed)
Pt is calling in stating that she is still not feeling good and would like to have the number to go to the Respiratory Clinic at [facility].  Pt would like to have a call back today.

## 2019-05-25 NOTE — Telephone Encounter (Signed)
Pt has been scheduled for respiratory clinic

## 2019-05-26 ENCOUNTER — Ambulatory Visit (INDEPENDENT_AMBULATORY_CARE_PROVIDER_SITE_OTHER): Payer: 59 | Admitting: Medical

## 2019-05-26 ENCOUNTER — Encounter: Payer: Self-pay | Admitting: Medical

## 2019-05-26 VITALS — BP 168/90 | HR 83 | Temp 98.7°F | Ht 67.0 in | Wt 309.0 lb

## 2019-05-26 DIAGNOSIS — G4730 Sleep apnea, unspecified: Secondary | ICD-10-CM

## 2019-05-26 DIAGNOSIS — E269 Hyperaldosteronism, unspecified: Secondary | ICD-10-CM

## 2019-05-26 DIAGNOSIS — J9809 Other diseases of bronchus, not elsewhere classified: Secondary | ICD-10-CM

## 2019-05-26 DIAGNOSIS — E669 Obesity, unspecified: Secondary | ICD-10-CM

## 2019-05-26 DIAGNOSIS — R05 Cough: Secondary | ICD-10-CM | POA: Diagnosis not present

## 2019-05-26 DIAGNOSIS — R062 Wheezing: Secondary | ICD-10-CM | POA: Diagnosis not present

## 2019-05-26 DIAGNOSIS — E1169 Type 2 diabetes mellitus with other specified complication: Secondary | ICD-10-CM

## 2019-05-26 DIAGNOSIS — R059 Cough, unspecified: Secondary | ICD-10-CM

## 2019-05-26 DIAGNOSIS — I10 Essential (primary) hypertension: Secondary | ICD-10-CM

## 2019-05-26 MED ORDER — FLUTICASONE FUROATE-VILANTEROL 200-25 MCG/INH IN AEPB: 1.0000 | INHALATION_SPRAY | Freq: Every day | RESPIRATORY_TRACT | 1 refills | Status: DC

## 2019-05-26 MED ORDER — BENZONATATE 100 MG PO CAPS: 100.0000 mg | ORAL_CAPSULE | Freq: Two times a day (BID) | ORAL | 0 refills | Status: DC | PRN

## 2019-05-26 NOTE — Patient Instructions (Signed)
Recommendations:  Begin Breo inhaler for prevention of flare up, 1 puff daily  Continue Albuterol "rescue inhaler" 2 puffs every 4-6 hours as needed for wheezing, shortness of breath, cough  You can use Tessalon Perles for cough up to 3 times per day  Hydrate well with clear fluids.  Keep your urine clear.  Rest  [facility] 8[facility]  We are testing you for Covid.  This test should come back in a few days  Follow up with your primary doctor about elevated blood pressure, sleep apnea, and diabetes control      Of note, covid symptoms can include fever, tiredness, body aches, cough, sore throat, diarrhea, headache, loss of taste or smell, shortness of breath, rash, and discoloration of fingers or toes.    Risk factors that may put someone at risk for worse outcome with covid infection includes older than 57 years old, underlying health conditions like COPD, heart failure, asthma, diabetes, hypertension, kidney disease, obesity, and history of heart disease or stroke.  Currently your symptoms seem mild.     General recommendations: I recommend you rest, hydrate well with water and clear fluids throughout the day.   Drink enough water and clear fluids so that your urine is clear.   You can use Tylenol for pain or fever every 4-6 hours. You can use over the counter Emetrol for nausea.    Consider using over the counter Emergen-C Immune + over the counter for the next week.  If you need any medications from the pharmacy, have a friend or family member pick them up from the pharmacy for you.  Have them drop off the medications at your home to keep you from having to go into the pharmacy and potentially exposure others.   If this is not possible, see if your pharmacy does home delivery.  Or worse case scenario, go through the drive through at your pharmacy, wear your mask, use hand sanitizer before touching anything in the drive through transaction,  and limit interaction with the store personally, particularly staying > 6 feet apart.  Over the next few days if you are having worse trouble breathing, if you are very weak, have persistent fever 101 or higher consistently despite Tylenol, uncontrollable nausea and vomiting, or feel very dehydrated, then call or go to the emergency department.    If you have other questions or have other symptoms or questions you are concerned about then please make a virtual visit with your primary care [name].  Covid symptoms such as fatigue and cough can linger over 2 weeks, even after the initial fever, aches, chills, and other initial symptoms.     Self Quarantine: The CDC, Centers for Disease Control has recommended a self quarantine of 10 -14 days from the start of your illness until you are symptom-free including at least 24 hours of no symptoms including no fever, no shortness of breath, and no body aches and chills, by day 10 before returning to work or general contact with the public.  What does self quarantine mean: avoiding contact with people as much as possible.   Particularly in your house, isolate your self from others in a separate room, wear a mask when possible in the room, particularly if coughing a lot.   Have others bring food, water, medications, etc., to your door, but avoid direct contact with your household contacts during this time to avoid spreading the infection to them.   If you  have a separate bathroom and living quarters during the next 2 weeks away from others, that would be preferable.    If you can't completely isolate, then wear a mask, wash hands frequently with soap and water for at least 15 seconds, minimize close contact with others, and have a friend or family member check regularly from a distance to make sure you are not getting seriously worse.     You should not be going out in public, should not be going to stores, to work or other public places until all your  symptoms have resolved and at least 10 days + 24 hours of no symptoms at all have transpired.   Ideally you should avoid contact with others for a full 10 days if possible.  One of the goals is to limit spread to high risk people; people that are older and elderly, people with multiple health issues like diabetes, heart disease, lung disease, and anybody that has weakened immune systems such as people with cancer or on immunosuppressive therapy.

## 2019-05-26 NOTE — Progress Notes (Signed)
[facility]   Subjective:  [name] is a 59 y.o. female who presents for respiratory illness.    PCP:  Burnis Medin, MD  Every year for past 7 years gets a bronchitis episode with changs in weather to damp and cold weather.  Gets cough, sob.   This past year improved and didn't get as bad with an OTC pill for congestion.    In the past with bronchtis, has bad syptoms 4-6 months, uses multiple breathing treatments, steroids, inhalers.     currenlty her symptoms began 3 weeks ago with SOB, coughing a lot, more productive now.  Has to mute the phone all the time trying to talk.  Did virtual with PCP 4 weeks ago and mentioned the bronchitis coming on.   Dr. Regis Bill advised watch and wait approach.  She noted she continued to get worse in the next week and now has persisted with her "bronchitis."   Overall feels ok,  [place]alking [place] for exercise, but dealing with cough, fatigue, wheeing.   No fever.  Has mild sore throat for drainage and sniffling.   Took theraflu the last nights which helped.  No ear pain.  Had some loose stool today suddenly but otherise no loose stool.  No nausea, no vomging.  No body aches, no chillls.    No sick contacts, no covid contacts.    Has not been tested for covid.    Is not a smoker.  Doesn't check BPs at home.  Says BP is always high.  Use to be 160s, but had been 140-150s/90s.   No other aggravating or relieving factors.  No other c/o.  Past Medical History:  Diagnosis Date  . Allergic rhinitis   . Diabetes mellitus   . Hyperlipidemia   . Hypertension    x many years (since birht of her son at 84) Had workup with renal artery angiogram that showed no evidence for fibromuscular dysplasia ( no significant renal artery stenosis.) ECHO (8/11) EF 60-65%, mild LVH, no regional WMAs.  . Low back pain   . Morbid obesity ([place])   . OSA (obstructive sleep apnea) 12/28/2015    Current Outpatient Medications on File Prior  to Visit  Medication Sig Dispense Refill  . albuterol (PROAIR HFA) 108 (90 Base) MCG/ACT inhaler Inhale 1-2 puffs into the lungs 2 (two) times daily as needed for wheezing or shortness of breath. 18 g 0  . Azelastine-Fluticasone (DYMISTA) 137-50 MCG/ACT SUSP Place 2 sprays into the nose daily. 23 g 5  . felodipine (PLENDIL) 10 MG 24 hr tablet TAKE 1 TABLET (10 MG TOTAL) BY MOUTH DAILY. DUE FOR ANNUAL VISIT 90 tablet 0  . KLOR-CON M20 20 MEQ tablet TAKE 1 TABLET BY MOUTH EVERY DAY 90 tablet 0  . metFORMIN (GLUCOPHAGE-XR) 500 MG 24 hr tablet TAKE 1 TAB BY MOUTH DAILY WITH BREAKFAST. CAN INCREASE TO TWICE A DAY . AFTER 3 WEEKS AS TOLERATED 180 tablet 1  . Multiple Vitamin (MULTIVITAMIN WITH MINERALS) TABS tablet Take 1 tablet by mouth at bedtime. One a Day 50 plus    . Ascorbic Acid (VITAMIN C PO) Take 1 tablet by mouth at bedtime.    . carvedilol (COREG) 6.25 MG tablet TAKE 1 TABLET (6.25 MG TOTAL) BY MOUTH 2 (TWO) TIMES DAILY WITH A MEAL. FOR HYPERTENSION (Patient not taking: Reported on 05/26/2019) 180 tablet 0  . dapagliflozin propanediol (FARXIGA) 10 MG TABS tablet Take 10 mg by mouth daily. (Patient not taking:  Reported on 04/13/2019) 30 tablet 3  . glucose blood (ACCU-CHEK GUIDE) test strip Use as instructed to check sugar 3 times daily 300 each 1  . Insulin Glargine (BASAGLAR KWIKPEN) 100 UNIT/ML SOPN INJECT 30 UNITS INTO THE SKIN AT BEDTIME. (Patient not taking: Reported on 05/26/2019) 45 pen 1  . Insulin Pen Needle (BD PEN NEEDLE NANO U/F) 32G X 4 MM MISC INJECT 16 UNITS DAILY AT BEDTIME 100 each 5  . Lancets Misc. (ACCU-CHEK MULTICLIX LANCET DEV) KIT Use as instructed to check sugar 3 times daily 1 each prn  . ONE TOUCH LANCETS MISC Use to check 3 times daily 300 each 5  . pantoprazole (PROTONIX) 40 MG tablet TAKE 1 TABLET BY MOUTH EVERY DAY (Patient not taking: Reported on 05/26/2019) 90 tablet 0  . potassium chloride (KLOR-CON) 20 MEQ packet Take 40 mEq by mouth daily.    . predniSONE  (DELTASONE) 20 MG tablet Take 1 tablet (20 mg total) by mouth 2 (two) times daily with a meal. (Patient not taking: Reported on 04/13/2019) 10 tablet 0   No current facility-administered medications on file prior to visit.    ROS as in subjective      Objective: BP (!) 168/90 (BP Location: Left Arm, Cuff Size: Normal)   Pulse 83   Temp 98.7 F (37.1 C) (Oral)   Ht '5\' 7"'  (1.702 m)   Wt (!) 309 lb (140.2 kg)   SpO2 94%   BMI 48.40 kg/m   Wt Readings from Last 3 Encounters:  05/26/19 (!) 309 lb (140.2 kg)  04/13/19 (!) 314 lb 9.6 oz (142.7 kg)  03/23/19 (!) 311 lb 12.8 oz (141.4 kg)   BP Readings from Last 3 Encounters:  05/26/19 (!) 168/90  04/13/19 (!) 142/90  03/23/19 (!) 158/90     General appearance: Alert, WD/WN, no distress, coughing but not ill appearing                             Skin: warm, no rash                           Head: no sinus tenderness                            Eyes: conjunctiva normal, corneas clear, PERRLA                            Ears: flatTMs, external ear canals normal                          Nose: septum midline, turbinates swollen, with erythema and clear discharge             Mouth/throat: MMM, tongue normal, mild pharyngeal erythema                           Neck: supple, no adenopathy, no thyromegaly, non tender                          Heart: RRR, normal S1, S2, no murmurs                         Lungs: somewhat decreased breath sounds, no wheezes,  rales, or rhonchi Edema: 1+ bilat nonpitting LE edema , no asymmetry, no calve ttendnerss, chronic edema per patient      Assessment  Encounter Diagnoses  Name Primary?  . Cough Yes  . Wheezing   . Essential hypertension   . Sleep apnea, unspecified type   . Diabetes mellitus type 2 in obese ([place])   . Hyperaldosteronism ([place])   . MORBID OBESITY   . Recurrent bronchospasm       Plan: I reviewed her chart history, last labs available in the chart, prior blood pressure readings  from prior visits.  I reviewed her 2018 EKG in 2019 echocardiogram results in the chart record.  Her symptoms today are more related to chest tightness and shortness of breath which she attributes to her yearly bronchitis.  She does not necessarily feel sick no flulike symptoms body aches or chills.  Nevertheless, we will screen for Covid and send for chest x-ray tomorrow.  We discussed the recommendations below.  We discussed the need to follow-up with her primary care about her uncontrolled high blood pressure.  She also has not tolerated CPAP in the past but apparently still has sleep apnea and symptoms which could also contribute to the uncontrolled high blood pressure.  Patient Instructions  Recommendations:  Begin Breo inhaler for prevention of flare up, 1 puff daily  Continue Albuterol "rescue inhaler" 2 puffs every 4-6 hours as needed for wheezing, shortness of breath, cough  You can use Tessalon Perles for cough up to 3 times per day  Hydrate well with clear fluids.  Keep your urine clear.  Rest  Go for chest [facility] tomorrow after 8am at [facility]  We are testing you for Covid.  This test should come back in a few days  Follow up with your primary doctor about elevated blood pressure, sleep apnea, and diabetes control      Of note, covid symptoms can include fever, tiredness, body aches, cough, sore throat, diarrhea, headache, loss of taste or smell, shortness of breath, rash, and discoloration of fingers or toes.    Risk factors that may put someone at risk for worse outcome with covid infection includes older than 57 years old, underlying health conditions like COPD, heart failure, asthma, diabetes, hypertension, kidney disease, obesity, and history of heart disease or stroke.  Currently your symptoms seem mild.     General recommendations: I recommend you rest, hydrate well with water and clear fluids throughout the day.   Drink enough water and clear fluids  so that your urine is clear.   You can use Tylenol for pain or fever every 4-6 hours. You can use over the counter Emetrol for nausea.    Consider using over the counter Emergen-C Immune + over the counter for the next week.  If you need any medications from the pharmacy, have a friend or family member pick them up from the pharmacy for you.  Have them drop off the medications at your home to keep you from having to go into the pharmacy and potentially exposure others.   If this is not possible, see if your pharmacy does home delivery.  Or worse case scenario, go through the drive through at your pharmacy, wear your mask, use hand sanitizer before touching anything in the drive through transaction, and limit interaction with the store personally, particularly staying > 6 feet apart.  Over the next few days if you are having worse trouble breathing, if you are very weak, have  persistent fever 101 or higher consistently despite Tylenol, uncontrollable nausea and vomiting, or feel very dehydrated, then call or go to the emergency department.    If you have other questions or have other symptoms or questions you are concerned about then please make a virtual visit with your primary care provider.  Covid symptoms such as fatigue and cough can linger over 2 weeks, even after the initial fever, aches, chills, and other initial symptoms.     Self Quarantine: The CDC, Centers for Disease Control has recommended a self quarantine of 10 -14 days from the start of your illness until you are symptom-free including at least 24 hours of no symptoms including no fever, no shortness of breath, and no body aches and chills, by day 10 before returning to work or general contact with the public.  What does self quarantine mean: avoiding contact with people as much as possible.   Particularly in your house, isolate your self from others in a separate room, wear a mask when possible in the room, particularly if  coughing a lot.   Have others bring food, water, medications, etc., to your door, but avoid direct contact with your household contacts during this time to avoid spreading the infection to them.   If you have a separate bathroom and living quarters during the next 2 weeks away from others, that would be preferable.    If you can't completely isolate, then wear a mask, wash hands frequently with soap and water for at least 15 seconds, minimize close contact with others, and have a friend or family member check regularly from a distance to make sure you are not getting seriously worse.     You should not be going out in public, should not be going to stores, to work or other public places until all your symptoms have resolved and at least 10 days + 24 hours of no symptoms at all have transpired.   Ideally you should avoid contact with others for a full 10 days if possible.  One of the goals is to limit spread to high risk people; people that are older and elderly, people with multiple health issues like diabetes, heart disease, lung disease, and anybody that has weakened immune systems such as people with cancer or on immunosuppressive therapy.               [name] was seen today for breathing problem.  Diagnoses and all orders for this visit:  Cough -     DG Chest 2 View; Future -     Novel Coronavirus, NAA ([facility])  Wheezing -     DG Chest 2 View; Future -     Novel Coronavirus, NAA ([facility])  Essential hypertension -     Novel Coronavirus, NAA ([facility])  Sleep apnea, unspecified type -     Novel Coronavirus, NAA ([facility])  Diabetes mellitus type 2 in obese ([facility]) -     Novel Coronavirus, NAA ([facility])  Hyperaldosteronism ([facility]) -     Novel Coronavirus, NAA ([facility])  MORBID OBESITY -     Novel Coronavirus, NAA ([facility])  Recurrent bronchospasm -     Novel Coronavirus, NAA ([facility])  Other orders -     fluticasone furoate-vilanterol (BREO ELLIPTA) 200-25 MCG/INH AEPB;  Inhale 1 puff into the lungs daily. -     benzonatate (TESSALON) 100 MG capsule; Take 1 capsule (100 mg total) by mouth 2 (two) times daily as needed for cough.

## 2019-05-27 ENCOUNTER — Other Ambulatory Visit: Payer: Self-pay

## 2019-05-27 ENCOUNTER — Ambulatory Visit (HOSPITAL_COMMUNITY)
Admission: RE | Admit: 2019-05-27 | Discharge: 2019-05-27 | Disposition: A | Discharge status: Home / Self Care | Payer: 59 | Source: Ambulatory Visit | Attending: Medical | Admitting: Medical

## 2019-05-27 DIAGNOSIS — R05 Cough: Secondary | ICD-10-CM | POA: Diagnosis not present

## 2019-05-27 DIAGNOSIS — R062 Wheezing: Secondary | ICD-10-CM

## 2019-05-27 DIAGNOSIS — R059 Cough, unspecified: Secondary | ICD-10-CM

## 2019-05-27 LAB — SPECIMEN STATUS REPORT

## 2019-05-27 LAB — NOVEL CORONAVIRUS, NAA: SARS-CoV-2, NAA: NOT DETECTED

## 2019-05-28 ENCOUNTER — Telehealth: Payer: Self-pay

## 2019-05-28 NOTE — Telephone Encounter (Signed)
Pt called to verify medications sent in to her pharmacy on her last visit 05/26/19 to respiratory clinic.

## 2019-06-05 ENCOUNTER — Other Ambulatory Visit: Payer: Self-pay | Admitting: Internal Medicine

## 2019-06-06 ENCOUNTER — Other Ambulatory Visit: Payer: Self-pay | Admitting: Internal Medicine

## 2019-06-10 ENCOUNTER — Other Ambulatory Visit: Payer: Self-pay | Admitting: Internal Medicine

## 2019-06-14 ENCOUNTER — Other Ambulatory Visit: Payer: Self-pay | Admitting: Internal Medicine

## 2019-06-18 ENCOUNTER — Other Ambulatory Visit: Payer: Self-pay

## 2019-06-18 ENCOUNTER — Telehealth: Payer: Self-pay | Admitting: Internal Medicine

## 2019-06-18 MED ORDER — BENZONATATE 100 MG PO CAPS: 100.0000 mg | ORAL_CAPSULE | Freq: Two times a day (BID) | ORAL | 0 refills | Status: DC | PRN

## 2019-06-18 NOTE — Telephone Encounter (Signed)
Please advise 

## 2019-06-18 NOTE — Telephone Encounter (Signed)
Pt is having a lot of cough and shortness of breath since 01/20.  Pt was tested negative for COVID. Dr. Aleen Campi prescribed Brio Inhaler x 1 time a day, Benzonatate x 3 times a day, and Albuterol Inhaler during the day. Per pt, this is not working. Pt would like to know what Dr. Fabian Sharp would like for her to do? Thanks

## 2019-06-18 NOTE — Telephone Encounter (Signed)
I agree that  She should be bettor by now      I advise we get her appt with pulmonary   Since this has now been a month   And treatment isnt clear cut  Can send in refill of Tessa perles if she thinks  It helps her cough   Otherwise  If alam sx  Severe sob   Seek ed care over the weekend   [facility] see if can get her a asap appt with pulmonary but seh needs to agree to go at a certain time  So she doesn't cancel the appt . Thanks

## 2019-06-18 NOTE — Telephone Encounter (Signed)
Tessalon perles sent in.Pt states she will call and make appt with pulomonary and that she will seek emergency care if need be

## 2019-09-09 ENCOUNTER — Other Ambulatory Visit: Payer: Self-pay | Admitting: Internal Medicine

## 2019-09-12 ENCOUNTER — Other Ambulatory Visit: Payer: Self-pay | Admitting: Internal Medicine

## 2019-12-08 ENCOUNTER — Other Ambulatory Visit: Payer: Self-pay | Admitting: Internal Medicine

## 2019-12-23 ENCOUNTER — Telehealth: Payer: Self-pay | Admitting: Internal Medicine

## 2019-12-23 NOTE — Telephone Encounter (Signed)
Pt called to say she eeds a 90 day prescription on all her medications listed. She picked up her medications and she only received a 30 day supply.  metFORMIN (GLUCOPHAGE-XR) 500 MG 24 hr tablet   felodipine (PLENDIL) 10 MG 24 hr tablet   albuterol (VENTOLIN HFA) 108 (90 Base) MCG/ACT inhaler   potassium chloride SA (KLOR-CON M20) 20 MEQ tablet   Please call pt at [phone] please leave detailed message if she does not answer.  Please advise

## 2019-12-28 NOTE — Telephone Encounter (Signed)
Patient needs a follow up appointment if she has not seen an endocrinologist for her diabetes.

## 2019-12-28 NOTE — Telephone Encounter (Signed)
I called the patient and she was talking and I was not sure who she was talking to and she hung up on me. Hopefully she will call back.

## 2020-01-05 NOTE — Telephone Encounter (Signed)
Called patient and let her know that she is overdue for an in person visit and needs labs. Patient stated that she will call back to schedule an appointment as she will have to get off of work. Patient stated that her blood sugars have been very high and she does take protein shakes with fruit and that the fruit makes her blood sugars high. Patient also stated that she is not taking a lot of her medication and her BP levels are around 160/92. I advised that she needs to come in for a visit so we can help her manage her health. Patient verbalized an understanding and stated that she will call to schedule an appointment.

## 2020-01-06 ENCOUNTER — Other Ambulatory Visit: Payer: Self-pay | Admitting: Internal Medicine

## 2020-01-13 ENCOUNTER — Other Ambulatory Visit: Payer: Self-pay | Admitting: Internal Medicine

## 2020-01-26 ENCOUNTER — Ambulatory Visit: Payer: 59 | Admitting: Internal Medicine

## 2020-01-26 ENCOUNTER — Ambulatory Visit: Payer: No Typology Code available for payment source | Admitting: Internal Medicine

## 2020-01-26 ENCOUNTER — Other Ambulatory Visit: Payer: Self-pay

## 2020-01-26 ENCOUNTER — Encounter: Payer: Self-pay | Admitting: Internal Medicine

## 2020-01-26 VITALS — BP 140/86 | HR 77 | Temp 98.1°F | Ht 66.25 in | Wt 306.2 lb

## 2020-01-26 DIAGNOSIS — E785 Hyperlipidemia, unspecified: Secondary | ICD-10-CM

## 2020-01-26 DIAGNOSIS — I10 Essential (primary) hypertension: Secondary | ICD-10-CM | POA: Diagnosis not present

## 2020-01-26 DIAGNOSIS — E1165 Type 2 diabetes mellitus with hyperglycemia: Secondary | ICD-10-CM

## 2020-01-26 DIAGNOSIS — Z79899 Other long term (current) drug therapy: Secondary | ICD-10-CM | POA: Diagnosis not present

## 2020-01-26 DIAGNOSIS — R609 Edema, unspecified: Secondary | ICD-10-CM

## 2020-01-26 DIAGNOSIS — E876 Hypokalemia: Secondary | ICD-10-CM

## 2020-01-26 MED ORDER — FELODIPINE ER 10 MG PO TB24
10.0000 mg | ORAL_TABLET | Freq: Every day | ORAL | 0 refills | Status: DC
Start: 2020-01-26 — End: 2020-05-01

## 2020-01-26 MED ORDER — METFORMIN HCL ER 500 MG PO TB24
1000.0000 mg | ORAL_TABLET | Freq: Every day | ORAL | 1 refills | Status: DC
Start: 2020-01-26 — End: 2020-07-18

## 2020-01-26 MED ORDER — POTASSIUM CHLORIDE CRYS ER 20 MEQ PO TBCR
20.0000 meq | EXTENDED_RELEASE_TABLET | Freq: Every day | ORAL | 0 refills | Status: DC
Start: 2020-01-26 — End: 2020-04-21

## 2020-01-26 NOTE — Progress Notes (Signed)
Chief Complaint  Patient presents with  . Medication Refill    Doing okay, lots to discuss    HPI: [name] 59 y.o. come in for med eval  Last seen tele visit  And last labs overdue     Work  40 hours per week  At home  [employer]   DM: not too good  Only taking metformin er at night   Never took [place]  and not taking insulin    Fu over due and still has no endo  But is "Working" with her on  diabetes  bg range 180 and over 200 ocass better  130 with diet change   Never took farxiga   BP  Last  rreadings  133 and  157     Range  147  Has monitor  ocass very high 160 but most 130 or 140 range with nl diastolic   OSA sx 5 hours sleep. Did  Not follow through "cause  of expense ".   Is concern about swollen ankles     Swelling  Area . Concern .   Realized the bp med doing it  Not sure if worse   Never tried the carvedilol wonders about diuretic again .    She is taking  Potassium 20 meq per day  Felodipine and metformin and calcium supp  Never took carvedilol  Got first covid vaccine  recnetly wants to wait on flu vaccine  ROS: See pertinent positives and negatives per HPI. No current cp sob new   Past Medical History:  Diagnosis Date  . Allergic rhinitis   . Diabetes mellitus   . Hyperlipidemia   . Hypertension    x many years (since birht of her son at 58) Had workup with renal artery angiogram that showed no evidence for fibromuscular dysplasia ( no significant renal artery stenosis.) ECHO (8/11) EF 60-65%, mild LVH, no regional WMAs.  . Low back pain   . Morbid obesity ([place])   . OSA (obstructive sleep apnea) 12/28/2015    Family History  Problem Relation Age of Onset  . Cancer Mother   . Hypertension Mother   . Other Father        died in [place] History   Socioeconomic History  . Marital status: Married    Spouse name: Not on file  . Number of children: Not on file  . Years of education: Not on file  . Highest education level: Not on  file  Occupational History  . Not on file  Tobacco Use  . Smoking status: Never Smoker  . Smokeless tobacco: Never Used  Vaping Use  . Vaping Use: Never used  Substance and Sexual Activity  . Alcohol use: No    Alcohol/week: 0.0 standard drinks  . Drug use: No  . Sexual activity: Not on file  Other Topics Concern  . Not on file  Social History Narrative   Married   Regular exercise-yes   HH of 4    26 and 22    No pets   Works at [employer] Strain:   . Difficulty of Paying Living Expenses: Not on file  Food Insecurity:   . Worried About Charity fundraiser in the Last Year: Not on file  . Ran Out of Food in the Last Year: Not on file  Transportation Needs:   . Lack of  Transportation (Medical): Not on file  . Lack of Transportation (Non-Medical): Not on file  Physical Activity:   . Days of Exercise per Week: Not on file  . Minutes of Exercise per Session: Not on file  Stress:   . Feeling of Stress : Not on file  Social Connections:   . Frequency of Communication with Friends and Family: Not on file  . Frequency of Social Gatherings with Friends and Family: Not on file  . Attends Religious Services: Not on file  . Active Member of Clubs or Organizations: Not on file  . Attends Archivist Meetings: Not on file  . Marital Status: Not on file    Outpatient Medications Prior to Visit  Medication Sig Dispense Refill  . albuterol (VENTOLIN HFA) 108 (90 Base) MCG/ACT inhaler INHALE 1-2 PUFFS INTO THE LUNGS 2 (TWO) TIMES DAILY AS NEEDED FOR WHEEZING OR SHORTNESS OF BREATH. 8 g 0  . Ascorbic Acid (VITAMIN C PO) Take 1 tablet by mouth at bedtime.    . Azelastine-Fluticasone (DYMISTA) 137-50 MCG/ACT SUSP Place 2 sprays into the nose daily. 23 g 5  . benzonatate (TESSALON) 100 MG capsule Take 1 capsule (100 mg total) by mouth 2 (two) times daily as needed for cough. 30 capsule 0  . calcium carbonate  (OSCAL) 1500 (600 Ca) MG TABS tablet Take 600 mg of elemental calcium by mouth daily.    . fluticasone furoate-vilanterol (BREO ELLIPTA) 200-25 MCG/INH AEPB Inhale 1 puff into the lungs daily. 30 each 1  . Magnesium 250 MG TABS Take 1 tablet by mouth daily.    . Multiple Vitamin (MULTIVITAMIN WITH MINERALS) TABS tablet Take 1 tablet by mouth at bedtime. One a Day 50 plus    . MULTIPLE VITAMIN PO Take by mouth daily.    . pantoprazole (PROTONIX) 40 MG tablet TAKE 1 TABLET BY MOUTH EVERY DAY 90 tablet 0  . felodipine (PLENDIL) 10 MG 24 hr tablet TAKE 1 TABLET (10 MG TOTAL) BY MOUTH DAILY. PLEASE SCHEDULE FOLLOW UP VISIT FOR FURTHER REFILLS. [phone] 30 tablet 0  . metFORMIN (GLUCOPHAGE-XR) 500 MG 24 hr tablet Take 1 tablet (500 mg total) by mouth 2 (two) times daily with a meal. Please schedule follow up for further refills. [phone] (Patient taking differently: Take 500 mg by mouth daily. Please schedule follow up for further refills. [phone]) 60 tablet 0  . potassium chloride SA (KLOR-CON M20) 20 MEQ tablet Take 1 tablet (20 mEq total) by mouth daily. Please schedule follow up for further refills. [phone] 30 tablet 0  . glucose blood (ACCU-CHEK GUIDE) test strip Use as instructed to check sugar 3 times daily (Patient not taking: Reported on 01/26/2020) 300 each 1  . Insulin Pen Needle (BD PEN NEEDLE NANO U/F) 32G X 4 MM MISC INJECT 16 UNITS DAILY AT BEDTIME (Patient not taking: Reported on 01/26/2020) 100 each 5  . Lancets Misc. (ACCU-CHEK MULTICLIX LANCET DEV) KIT Use as instructed to check sugar 3 times daily (Patient not taking: Reported on 01/26/2020) 1 each prn  . ONE TOUCH LANCETS MISC Use to check 3 times daily (Patient not taking: Reported on 01/26/2020) 300 each 5  . carvedilol (COREG) 6.25 MG tablet TAKE 1 TABLET (6.25 MG TOTAL) BY MOUTH 2 (TWO) TIMES DAILY WITH A MEAL. FOR HYPERTENSION (Patient not taking: Reported on 05/26/2019) 180 tablet 0  . dapagliflozin propanediol  (FARXIGA) 10 MG TABS tablet Take 10 mg by mouth daily. (Patient not taking: Reported on 04/13/2019) 30 tablet 3  .  Insulin Glargine (BASAGLAR KWIKPEN) 100 UNIT/ML SOPN INJECT 30 UNITS INTO THE SKIN AT BEDTIME. (Patient not taking: Reported on 05/26/2019) 45 pen 1  . potassium chloride (KLOR-CON) 20 MEQ packet Take 40 mEq by mouth daily.    . predniSONE (DELTASONE) 20 MG tablet Take 1 tablet (20 mg total) by mouth 2 (two) times daily with a meal. (Patient not taking: Reported on 04/13/2019) 10 tablet 0   No facility-administered medications prior to visit.     EXAM:  BP 140/86   Pulse 77   Temp 98.1 F (36.7 C) (Oral)   Ht 5' 6.25" (1.683 m)   Wt (!) 306 lb 3.2 oz (138.9 kg)   SpO2 98%   BMI 49.05 kg/m   Body mass index is 49.05 kg/m. Wt Readings from Last 3 Encounters:  01/26/20 (!) 306 lb 3.2 oz (138.9 kg)  05/26/19 (!) 309 lb (140.2 kg)  04/13/19 (!) 314 lb 9.6 oz (142.7 kg)    GENERAL: vitals reviewed and listed above, alert, oriented, appears well hydrated and in no acute distress HEENT: atraumatic, conjunctiva  clear, no obvious abnormalities on inspection of external nose and ears OP : masked  NECK: no obvious masses on inspection palpation  LUNGS: clear to auscultation bilaterally, no wheezes, rales or rhonchi, good air movement CV: HRRR, no clubbing cyanosis or  peripheral edema nl cap refill  MS: moves all extremities without noticeable focal  Abnormality  2 + edema  Bilateral  PSYCH: pleasant and cooperative, no obvious depression or anxiety Lab Results  Component Value Date   WBC 6.6 01/27/2020   HGB 13.9 01/27/2020   HCT 41.5 01/27/2020   PLT 367 01/27/2020   GLUCOSE 193 (H) 01/27/2020   CHOL 181 01/27/2020   TRIG 132 01/27/2020   HDL 49 (L) 01/27/2020   LDLCALC 107 (H) 01/27/2020   ALT 19 01/27/2020   AST 15 01/27/2020   NA 139 01/27/2020   K 3.6 01/27/2020   CL 97 (L) 01/27/2020   CREATININE 1.01 01/27/2020   BUN 24 01/27/2020   CO2 29 01/27/2020    TSH 1.71 01/27/2020   INR 1.0 RATIO 07/08/2006   HGBA1C 9.0 (H) 01/27/2020   MICROALBUR 2.4 (H) 05/25/2018   BP Readings from Last 3 Encounters:  01/26/20 140/86  05/26/19 (!) 168/90  04/13/19 (!) 142/90    ASSESSMENT AND PLAN:  Discussed the following assessment and plan:  Uncontrolled type 2 diabetes mellitus with hyperglycemia (HCC) - Plan: Hemoglobin A1c, TSH, Hepatic function panel, Lipid panel, BASIC METABOLIC PANEL WITH GFR, CBC with Differential/Platelet, Microalbumin / creatinine urine ratio, Magnesium  Essential hypertension - Plan: Hemoglobin A1c, TSH, Hepatic function panel, Lipid panel, BASIC METABOLIC PANEL WITH GFR, CBC with Differential/Platelet, Microalbumin / creatinine urine ratio, Magnesium  Medication management - Plan: Hemoglobin A1c, TSH, Hepatic function panel, Lipid panel, BASIC METABOLIC PANEL WITH GFR, CBC with Differential/Platelet, Microalbumin / creatinine urine ratio, Magnesium  Hyperlipidemia, unspecified hyperlipidemia type - Plan: Hemoglobin A1c, TSH, Hepatic function panel, Lipid panel, BASIC METABOLIC PANEL WITH GFR, CBC with Differential/Platelet, Microalbumin / creatinine urine ratio  Morbid obesity, unspecified obesity type ([place]) - Plan: Hemoglobin A1c, TSH, Hepatic function panel, Lipid panel, BASIC METABOLIC PANEL WITH GFR, CBC with Differential/Platelet, Microalbumin / creatinine urine ratio  Edema, unspecified type - med only part of problem suspect multifactorial.  - Plan: Hemoglobin A1c, TSH, Hepatic function panel, Lipid panel, BASIC METABOLIC PANEL WITH GFR, CBC with Differential/Platelet, Microalbumin / creatinine urine ratio, Magnesium  Low serum potassium - Plan:  Magnesium Will review record  And  May retry spironolactone type med    ? Add hydralazine has had se of many meds or not helpful .  -Patient advised to return or notify health care team  if  new concerns arise. Review and plan counsel  45 minutes   Patient Instructions  Your  elevated sugar can certainly make you feel sick  And tired   Advise  Diet alone will not control at thist ime since sugar has been elevated for so long but still very immportant .  Sdee endocrinology as discussed  Should consdier farziga and or ozempic  Also     ( pills  )   Get fasting lab appt   Try taking 2 metformin   At night  I will review record and look at potassium  And decide on adding med for BP.   Standley Brooking. Teddi Badalamenti M.D.  It appears that she was given spironolactone 5 or 6 years ago question why it was stopped vague reason. We could consider doing  low dose such as of 12.5 mg a day that might help edema potassium and blood pressure.

## 2020-01-26 NOTE — Patient Instructions (Addendum)
Your elevated sugar can certainly make you feel sick  And tired   Advise  Diet alone will not control at thist ime since sugar has been elevated for so long but still very immportant .  Sdee endocrinology as discussed  Should consdier farziga and or ozempic  Also     ( pills  )   Get fasting lab appt   Try taking 2 metformin   At night  I will review record and look at potassium  And decide on adding med for BP.

## 2020-01-27 ENCOUNTER — Telehealth: Payer: 59 | Admitting: Internal Medicine

## 2020-01-27 ENCOUNTER — Other Ambulatory Visit: Payer: No Typology Code available for payment source

## 2020-01-27 ENCOUNTER — Telehealth: Payer: Self-pay | Admitting: Internal Medicine

## 2020-01-27 DIAGNOSIS — E876 Hypokalemia: Secondary | ICD-10-CM

## 2020-01-27 DIAGNOSIS — I10 Essential (primary) hypertension: Secondary | ICD-10-CM

## 2020-01-27 DIAGNOSIS — Z79899 Other long term (current) drug therapy: Secondary | ICD-10-CM

## 2020-01-27 DIAGNOSIS — R609 Edema, unspecified: Secondary | ICD-10-CM

## 2020-01-27 DIAGNOSIS — E1165 Type 2 diabetes mellitus with hyperglycemia: Secondary | ICD-10-CM

## 2020-01-27 DIAGNOSIS — E785 Hyperlipidemia, unspecified: Secondary | ICD-10-CM

## 2020-01-27 NOTE — Telephone Encounter (Signed)
FYI:  Pt came in and requested that I send a msg to Dr. Fabian Sharp and after getting in the chart she wanted to let Dr. Fabian Sharp know that she really appreciate all that she is doing and has done for her and wanted her to have a great day and week.

## 2020-01-28 LAB — LIPID PANEL
Cholesterol: 181 mg/dL (ref <200)
HDL: 49 mg/dL — ABNORMAL LOW (ref >=50)
LDL Cholesterol (Calc): 107 mg/dL (calc) — ABNORMAL HIGH
Non-HDL Cholesterol (Calc): 132 mg/dL (calc) — ABNORMAL HIGH (ref <130)
Total CHOL/HDL Ratio: 3.7 (calc) (ref <5.0)
Triglycerides: 132 mg/dL (ref <150)

## 2020-01-28 LAB — CBC WITH DIFFERENTIAL/PLATELET
Absolute Monocytes: 462 cells/uL (ref 200–950)
Basophils Absolute: 59 cells/uL (ref 0–200)
Basophils Relative: 0.9 %
Eosinophils Absolute: 158 cells/uL (ref 15–500)
Eosinophils Relative: 2.4 %
HCT: 41.5 % (ref 35.0–45.0)
Hemoglobin: 13.9 g/dL (ref 11.7–15.5)
Lymphs Abs: 2541 cells/uL (ref 850–3900)
MCH: 31.2 pg (ref 27.0–33.0)
MCHC: 33.5 g/dL (ref 32.0–36.0)
MCV: 93.3 fL (ref 80.0–100.0)
MPV: 10.9 fL (ref 7.5–12.5)
Monocytes Relative: 7 %
Neutro Abs: 3379 cells/uL (ref 1500–7800)
Neutrophils Relative %: 51.2 %
Platelets: 367 10*3/uL (ref 140–400)
RBC: 4.45 10*6/uL (ref 3.80–5.10)
RDW: 11.9 % (ref 11.0–15.0)
Total Lymphocyte: 38.5 %
WBC: 6.6 10*3/uL (ref 3.8–10.8)

## 2020-01-28 LAB — HEPATIC FUNCTION PANEL
AG Ratio: 1.2 (calc) (ref 1.0–2.5)
ALT: 19 U/L (ref 6–29)
AST: 15 U/L (ref 10–35)
Albumin: 3.9 g/dL (ref 3.6–5.1)
Alkaline phosphatase (APISO): 87 U/L (ref 37–153)
Bilirubin, Direct: 0.1 mg/dL (ref 0.0–0.2)
Globulin: 3.3 g/dL (calc) (ref 1.9–3.7)
Indirect Bilirubin: 0.3 mg/dL (calc) (ref 0.2–1.2)
Total Bilirubin: 0.4 mg/dL (ref 0.2–1.2)
Total Protein: 7.2 g/dL (ref 6.1–8.1)

## 2020-01-28 LAB — BASIC METABOLIC PANEL WITH GFR
BUN: 24 mg/dL (ref 7–25)
CO2: 29 mmol/L (ref 20–32)
Calcium: 9.2 mg/dL (ref 8.6–10.4)
Chloride: 97 mmol/L — ABNORMAL LOW (ref 98–110)
Creat: 1.01 mg/dL (ref 0.50–1.05)
GFR, Est African American: 72 mL/min/{1.73_m2} (ref >=60)
GFR, Est Non African American: 62 mL/min/{1.73_m2} (ref >=60)
Glucose, Bld: 193 mg/dL — ABNORMAL HIGH (ref 65–99)
Potassium: 3.6 mmol/L (ref 3.5–5.3)
Sodium: 139 mmol/L (ref 135–146)

## 2020-01-28 LAB — HEMOGLOBIN A1C
Hgb A1c MFr Bld: 9 % of total Hgb — ABNORMAL HIGH (ref <5.7)
Mean Plasma Glucose: 212 (calc)
eAG (mmol/L): 11.7 (calc)

## 2020-01-28 LAB — TSH: TSH: 1.71 mIU/L (ref 0.40–4.50)

## 2020-01-28 LAB — MAGNESIUM: Magnesium: 1.6 mg/dL (ref 1.5–2.5)

## 2020-03-02 ENCOUNTER — Telehealth: Payer: Self-pay | Admitting: Internal Medicine

## 2020-03-02 NOTE — Telephone Encounter (Signed)
Pt would like to her test results from 01/27/2020. Please call [phone].

## 2020-03-02 NOTE — Telephone Encounter (Signed)
Attempted to contact patient no answer. LVM for return call. PCP has not made any comments on labs results from 01/27/2020. Forwarding phone note

## 2020-03-03 NOTE — Telephone Encounter (Signed)
Spoke with patient about results.  Will mail results to patients home

## 2020-03-03 NOTE — Telephone Encounter (Signed)
See result note  Below   Apologies for late result comments  A1c was 9 still up  and too high  potassium  low normal 3.6 magnesium low normal  but ok Thyroid and kidneys liver tests normal range . I reviewed your record   and saw that many years ago we may have tried to use spironolactone but not sure you ever took it or had a side effect  from the med or something else. This med can  help bp and potassium and edema in people with low potassium levels  We could try  low dose spironolactone 12. 5 twice a day and see how it goes .and may be back off on the potassium pills  At this time can you make a video visit to discuss this?

## 2020-03-03 NOTE — Progress Notes (Signed)
Apologies for late result comments  A1c was 9 still up  and too high  potassium  low normal 3.6 magnesium low normal  but ok Thyroid and kidneys liver tests normal range . I reviewed your record   and saw that many years ago we may have tried to use spironolactone but not sure you ever took it or had a side effect  from the med or something else. This med can  help bp and potassium and edema in people with low potassium levels  We could try  low dose spironolactone 12. 5 twice a day and see how it goes .and may be back off on the potassium pills  At this time can you make a video visit to discuss this?

## 2020-03-14 ENCOUNTER — Other Ambulatory Visit: Payer: Self-pay | Admitting: Internal Medicine

## 2020-03-14 NOTE — Telephone Encounter (Signed)
Last OV 01/26/20 Last fill 09/13/19  #90/0

## 2020-03-18 LAB — HM DIABETES EYE EXAM

## 2020-04-20 ENCOUNTER — Other Ambulatory Visit: Payer: Self-pay | Admitting: Internal Medicine

## 2020-04-30 ENCOUNTER — Other Ambulatory Visit: Payer: Self-pay | Admitting: Internal Medicine

## 2020-05-03 ENCOUNTER — Telehealth: Payer: Self-pay | Admitting: Adult Health

## 2020-05-03 NOTE — Telephone Encounter (Signed)
Spoke with the pt  She states needing a refill her her Dymista  She was last seen June 2019  I advised will need f/u for refills  I scheduled her visit with Dr Isaiah Serge for 05/30/20  She is asking if we can please send in a refill to last her until this appt  Please advise if okay to send, thanks!

## 2020-05-09 MED ORDER — AZELASTINE-FLUTICASONE 137-50 MCG/ACT NA SUSP: 2.0000 | Freq: Every day | NASAL | 0 refills | Status: DC

## 2020-05-09 NOTE — Telephone Encounter (Signed)
rx sent to pharmacy.  Nothing further needed at this time- will close encounter.

## 2020-05-09 NOTE — Telephone Encounter (Signed)
Yes it is okay to send in a one-time prescription with no refill

## 2020-05-24 ENCOUNTER — Telehealth: Payer: Self-pay | Admitting: Pulmonary Disease

## 2020-05-24 NOTE — Telephone Encounter (Signed)
Pt is requesting a refill for Breo and Tessalon.  Pt's last ov was 10/28/2017. Pt does have an appt with Dr Isaiah Serge scheduled for 05/30/20.  Left message for pt that we will send to Dr Isaiah Serge for his approval and call her back with an update.  Please advise if ok to refill.

## 2020-05-24 NOTE — Telephone Encounter (Signed)
OK to refill Breo and tessalon.

## 2020-05-25 MED ORDER — FLUTICASONE FUROATE-VILANTEROL 200-25 MCG/INH IN AEPB: 1.0000 | INHALATION_SPRAY | Freq: Every day | RESPIRATORY_TRACT | 0 refills | Status: DC

## 2020-05-25 MED ORDER — BENZONATATE 100 MG PO CAPS: 100.0000 mg | ORAL_CAPSULE | Freq: Two times a day (BID) | ORAL | 0 refills | Status: DC | PRN

## 2020-05-25 NOTE — Telephone Encounter (Signed)
Rxs for Breo and Tessalon refilled x 1 only   Pt aware and to keep appt here 05/30/20  Nothing further needed

## 2020-05-26 ENCOUNTER — Ambulatory Visit: Payer: No Typology Code available for payment source | Admitting: Pulmonary Disease

## 2020-05-30 ENCOUNTER — Ambulatory Visit: Payer: No Typology Code available for payment source | Admitting: Pulmonary Disease

## 2020-05-30 ENCOUNTER — Other Ambulatory Visit: Payer: Self-pay

## 2020-05-30 ENCOUNTER — Encounter: Payer: Self-pay | Admitting: Pulmonary Disease

## 2020-05-30 VITALS — BP 140/72 | HR 83 | Temp 97.6°F | Ht 67.0 in | Wt 309.0 lb

## 2020-05-30 DIAGNOSIS — R059 Cough, unspecified: Secondary | ICD-10-CM

## 2020-05-30 NOTE — Patient Instructions (Addendum)
I am glad you are feeling well with regard to your breathing Continue to breo, nose spray and medications as prescribed  Follow-up in 1 year.

## 2020-05-30 NOTE — Progress Notes (Signed)
Attending note: I have seen and examined the patient. History, labs and imaging reviewed. Agree with assessment and plan as noted by Dr. Bailey Mech MD [facility] 05/30/2020, 9:47 AM

## 2020-05-30 NOTE — Progress Notes (Addendum)
[name]    [id number]    November 10, 1960  Primary Care Physician:Panosh, Standley Brooking, MD  Referring Physician: Burnis Medin, [place],  [place] [address]  Chief complaint:   Follow up for upper airway cough syndrome. OSA  HPI: 60 year old with past medical history of severe untreated sleep apnea, diabetes, hypertension, hyperlipidemia.  Originally seen in 2018 for upper airway cough after an episode of bronchitis.  This responded to treatment with antihistamine, nasal spray and antiacid medication.  She did notice episode of bronchitis in early 2019 which has been slow to resolve.  Seen by nurse practitioner in clinic a couple of times with slow to resolve flare.  Work-up including labs, echocardiogram, chest x-ray has been unrevealing.  She was on Symbicort at some point but stopped as it was not helping with her symptoms.  She has history of sleep apnea and started on CPAP at 16 in 2017. She stopped using it since the mask was suffocating her. The CPAP machine was taken back by the insurance company because she was not using it. She wants to retry the CPAP with a different interface now but was told that she may need a new sleep study.  Interim History: She reports her cough has flared up since September 2021. She states this happens yearly during the fall/winter months, triggered by congestion and post nasal drainage. Denies fever, sinus pain/pressure. She is worried that steroids will worsen her diabetes.She has been using Breo as needed.  Outpatient Encounter Medications as of 05/30/2020  Medication Sig  . albuterol (VENTOLIN HFA) 108 (90 Base) MCG/ACT inhaler INHALE 1-2 PUFFS INTO THE LUNGS 2 (TWO) TIMES DAILY AS NEEDED FOR WHEEZING OR SHORTNESS OF BREATH.  [name] Ascorbic Acid (VITAMIN C PO) Take 1 tablet by mouth at bedtime.  . Azelastine-Fluticasone (DYMISTA) 137-50 MCG/ACT SUSP Place 2 sprays into the nose daily.  . benzonatate (TESSALON) 100 MG  capsule Take 1 capsule (100 mg total) by mouth 2 (two) times daily as needed for cough.  . calcium carbonate (OSCAL) 1500 (600 Ca) MG TABS tablet Take 600 mg of elemental calcium by mouth daily.  . felodipine (PLENDIL) 10 MG 24 hr tablet TAKE 1 TABLET BY MOUTH EVERY DAY  . fluticasone furoate-vilanterol (BREO ELLIPTA) 200-25 MCG/INH AEPB Inhale 1 puff into the lungs daily.  [name] glucose blood (ACCU-CHEK GUIDE) test strip Use as instructed to check sugar 3 times daily  . KLOR-CON M20 20 MEQ tablet TAKE 1 TABLET BY MOUTH EVERY DAY  . Lancets Misc. (ACCU-CHEK MULTICLIX LANCET DEV) KIT Use as instructed to check sugar 3 times daily  . Magnesium 250 MG TABS Take 1 tablet by mouth daily.  . metFORMIN (GLUCOPHAGE-XR) 500 MG 24 hr tablet Take 2 tablets (1,000 mg total) by mouth daily.  . Multiple Vitamin (MULTIVITAMIN WITH MINERALS) TABS tablet Take 1 tablet by mouth at bedtime. One a Day 50 plus  . MULTIPLE VITAMIN PO Take by mouth daily.  . ONE TOUCH LANCETS MISC Use to check 3 times daily  . pantoprazole (PROTONIX) 40 MG tablet TAKE 1 TABLET BY MOUTH EVERY DAY  . [DISCONTINUED] Insulin Pen Needle (BD PEN NEEDLE NANO U/F) 32G X 4 MM MISC INJECT 16 UNITS DAILY AT BEDTIME   No facility-administered encounter medications on file as of 05/30/2020.    Allergies as of 05/30/2020 - Review Complete 05/30/2020  Allergen Reaction Noted  . Candesartan cilexetil Other (See Comments)   .  Codeine Hives, Itching, and Swelling 06/02/2012  . Ibuprofen Hives   . Telmisartan Other (See Comments)   . Zithromax [azithromycin] Diarrhea 06/02/2012    Past Medical History:  Diagnosis Date  . Allergic rhinitis   . Diabetes mellitus   . Hyperlipidemia   . Hypertension    x many years (since birht of her son at 7) Had workup with renal artery angiogram that showed no evidence for fibromuscular dysplasia ( no significant renal artery stenosis.) ECHO (8/11) EF 60-65%, mild LVH, no regional WMAs.  . Low back pain   .  Morbid obesity ([place])   . OSA (obstructive sleep apnea) 12/28/2015    Past Surgical History:  Procedure Laterality Date  . ABDOMINAL HYSTERECTOMY     partial for fibroids  . TUBAL LIGATION      Family History  Problem Relation Age of Onset  . Cancer Mother   . Hypertension Mother   . Other Father        died in [place] History   Socioeconomic History  . Marital status: Married    Spouse name: Not on file  . Number of children: Not on file  . Years of education: Not on file  . Highest education level: Not on file  Occupational History  . Not on file  Tobacco Use  . Smoking status: Never Smoker  . Smokeless tobacco: Never Used  Vaping Use  . Vaping Use: Never used  Substance and Sexual Activity  . Alcohol use: No    Alcohol/week: 0.0 standard drinks  . Drug use: No  . Sexual activity: Not on file  Other Topics Concern  . Not on file  Social History Narrative   Married   Regular exercise-yes   HH of 4    26 and 22    No pets   Works at [employer] Strain: Not on [employer] Insecurity: Not on file  Transportation Needs: Not on file  Physical Activity: Not on file  Stress: Not on file  Social Connections: Not on file  Intimate Partner Violence: Not on file   Review of systems: Review of Systems  Constitutional: Negative for fever and chills.  HENT: Negative.   Eyes: Negative for blurred vision.  Respiratory: as per HPI  Cardiovascular: Negative for chest pain and palpitations.  Gastrointestinal: Negative for vomiting, diarrhea, blood per rectum. Genitourinary: Negative for dysuria, urgency, frequency and hematuria.  Musculoskeletal: Negative for myalgias, back pain and joint pain.  Skin: Negative for itching and rash.  Neurological: Negative for dizziness, tremors, focal weakness, seizures and loss of consciousness.  Endo/Heme/Allergies: Negative for environmental allergies.   Psychiatric/Behavioral: Negative for depression, suicidal ideas and hallucinations.  All other systems reviewed and are negative.  Physical Exam: Blood pressure 138/78, pulse 91, height '5\' 7"'  (1.702 m), weight (!) 315 lb (142.9 kg), SpO2 94 %. Gen:      No acute distress HEENT:  EOMI, sclera anicteric Neck:     No masses; no thyromegaly Lungs:    Clear to auscultation bilaterally; normal respiratory effort CV:         Regular rate and rhythm; no murmurs Abd:      + bowel sounds; soft, non-tender; no palpable masses, no distension Ext:    No edema; adequate peripheral perfusion Skin:      Warm and dry; no rash Neuro: alert and oriented x 3 Psych: normal mood  and affect  Data Reviewed: FENO  06/05/16- 17 10/28/17- 13  Chest x-ray 06/02/16- no active cardio pulmonary disease Chest x-ray 07/25/17- stable cardiomegaly.  No active cardiopulmonary disease.  PFTs 07/24/16 FVC 2.17 (69%), FEV1 1.86 (75%), F/F 86, TLC 71%, DLCO 77%, DLCO/VA 132%  Allergy test 06/24/16- sensitivity to cockroach, ragweed, dust mite. IgE 170  Sleep study 12/06/15, AHI 43.6, SaO2 low 55%.  Echocardiogram 07/23/2017-Left ventricle: The cavity size was normal. Wall thickness was   increased in a pattern of mild LVH. Systolic function was normal.   The estimated ejection fraction was in the range of 55% to 60%.  Assessment:  Chronic upper airway cough Dyspnea Likely postinfectious from bronchitis with contribution from postnasal drip, acid reflux, untreated OSA Suspicion for asthma is low as PFTs did not show any obstruction, FENO is low. She does respond well to [facility], but uses this as needed.  PFTs do show mild restriction with TLC impairment that corrects for alveolar volume.  I suspect these reductions are related to obesity and body habitus.  There is no evidence of interstitial lung disease.  Encouraged weight loss and exercise. - Continue Breo, azelastine and OTC decongestants  - Continue chlorpheniramine  antihistamine, Dymista nasal spray, cough syrup, Protonix, and Singulair.   Untreated OSA She is not interested in a sleep study or CPAP at this time. Asked her to follow-up with Dr. Halford Chessman for further evaluation and consideration for dental device.   Plan/Recommendations: - Continue Breo daily   - Continue antacid med, chlorpheniramine, dymista, singulair  - Weight loss exercise   Harlow Ohms, DO PGY-2 IM   [facility] 05/30/2020, 9:32 AM  CC: Panosh, Standley Brooking, MD  Attending note: I have seen and examined the patient. History, labs and imaging reviewed. Agree with assessment and plan  Marshell Garfinkel MD [facility] 05/30/2020, 9:45 AM

## 2020-05-30 NOTE — Progress Notes (Deleted)
[name]    [id number]    1960-10-12  Primary Care Physician:Panosh, Standley Brooking, MD  Referring Physician: Burnis Medin, [place],  [place] [address]  Chief complaint:   Follow up for upper airway cough syndrome. OSA  HPI: 60 year old with past medical history of severe untreated sleep apnea, diabetes, hypertension, hyperlipidemia.  Originally seen in 2018 for upper airway cough after an episode of bronchitis.  This responded to treatment with antihistamine, nasal spray and antiacid medication.  She did notice episode of bronchitis in early 2019 which has been slow to resolve.  Seen by nurse practitioner in clinic a couple of times with slow to resolve flare.  Work-up including labs, echocardiogram, chest x-ray has been unrevealing.  She was on Symbicort at some point but stopped as it was not helping with her symptoms.  She has history of sleep apnea and started on CPAP at 16 in 2017. She stopped using it since the mask was suffocating her. The CPAP machine was taken back by the insurance company because she was not using it. She wants to retry the CPAP with a different interface now but was told that she may need a new sleep study.  Interim History: States that cough is slightly better but still has daily symptoms, nonproductive in nature.  No fevers, chills She has been given a prescription for prednisone but did not take it due to high blood sugars.  Outpatient Encounter Medications as of 05/30/2020  Medication Sig  . albuterol (VENTOLIN HFA) 108 (90 Base) MCG/ACT inhaler INHALE 1-2 PUFFS INTO THE LUNGS 2 (TWO) TIMES DAILY AS NEEDED FOR WHEEZING OR SHORTNESS OF BREATH.  [name] Ascorbic Acid (VITAMIN C PO) Take 1 tablet by mouth at bedtime.  . Azelastine-Fluticasone (DYMISTA) 137-50 MCG/ACT SUSP Place 2 sprays into the nose daily.  . benzonatate (TESSALON) 100 MG capsule Take 1 capsule (100 mg total) by mouth 2 (two) times daily as needed for  cough.  . calcium carbonate (OSCAL) 1500 (600 Ca) MG TABS tablet Take 600 mg of elemental calcium by mouth daily.  . felodipine (PLENDIL) 10 MG 24 hr tablet TAKE 1 TABLET BY MOUTH EVERY DAY  . fluticasone furoate-vilanterol (BREO ELLIPTA) 200-25 MCG/INH AEPB Inhale 1 puff into the lungs daily.  [name] glucose blood (ACCU-CHEK GUIDE) test strip Use as instructed to check sugar 3 times daily  . KLOR-CON M20 20 MEQ tablet TAKE 1 TABLET BY MOUTH EVERY DAY  . Lancets Misc. (ACCU-CHEK MULTICLIX LANCET DEV) KIT Use as instructed to check sugar 3 times daily  . Magnesium 250 MG TABS Take 1 tablet by mouth daily.  . metFORMIN (GLUCOPHAGE-XR) 500 MG 24 hr tablet Take 2 tablets (1,000 mg total) by mouth daily.  . Multiple Vitamin (MULTIVITAMIN WITH MINERALS) TABS tablet Take 1 tablet by mouth at bedtime. One a Day 50 plus  . MULTIPLE VITAMIN PO Take by mouth daily.  . ONE TOUCH LANCETS MISC Use to check 3 times daily  . pantoprazole (PROTONIX) 40 MG tablet TAKE 1 TABLET BY MOUTH EVERY DAY  . [DISCONTINUED] Insulin Pen Needle (BD PEN NEEDLE NANO U/F) 32G X 4 MM MISC INJECT 16 UNITS DAILY AT BEDTIME   No facility-administered encounter medications on file as of 05/30/2020.    Allergies as of 05/30/2020 - Review Complete 05/30/2020  Allergen Reaction Noted  . Candesartan cilexetil Other (See Comments)   . Codeine Hives, Itching, and Swelling 06/02/2012  . Ibuprofen  Hives   . Telmisartan Other (See Comments)   . Zithromax [azithromycin] Diarrhea 06/02/2012    Past Medical History:  Diagnosis Date  . Allergic rhinitis   . Diabetes mellitus   . Hyperlipidemia   . Hypertension    x many years (since birht of her son at 51) Had workup with renal artery angiogram that showed no evidence for fibromuscular dysplasia ( no significant renal artery stenosis.) ECHO (8/11) EF 60-65%, mild LVH, no regional WMAs.  . Low back pain   . Morbid obesity ([facility])   . OSA (obstructive sleep apnea) 12/28/2015    Past  Surgical History:  Procedure Laterality Date  . ABDOMINAL HYSTERECTOMY     partial for fibroids  . TUBAL LIGATION      Family History  Problem Relation Age of Onset  . Cancer Mother   . Hypertension Mother   . Other Father        died in [place] History   Socioeconomic History  . Marital status: Married    Spouse name: Not on file  . Number of children: Not on file  . Years of education: Not on file  . Highest education level: Not on file  Occupational History  . Not on file  Tobacco Use  . Smoking status: Never Smoker  . Smokeless tobacco: Never Used  Vaping Use  . Vaping Use: Never used  Substance and Sexual Activity  . Alcohol use: No    Alcohol/week: 0.0 standard drinks  . Drug use: No  . Sexual activity: Not on file  Other Topics Concern  . Not on file  Social History Narrative   Married   Regular exercise-yes   HH of 4    28 and 24    No pets   Works at [employer] Strain: Not on [employer] Insecurity: Not on file  Transportation Needs: Not on file  Physical Activity: Not on file  Stress: Not on file  Social Connections: Not on file  Intimate Partner Violence: Not on file   Review of systems: Review of Systems  Constitutional: Negative for fever and chills.  HENT: Negative.   Eyes: Negative for blurred vision.  Respiratory: as per HPI  Cardiovascular: Negative for chest pain and palpitations.  Gastrointestinal: Negative for vomiting, diarrhea, blood per rectum. Genitourinary: Negative for dysuria, urgency, frequency and hematuria.  Musculoskeletal: Negative for myalgias, back pain and joint pain.  Skin: Negative for itching and rash.  Neurological: Negative for dizziness, tremors, focal weakness, seizures and loss of consciousness.  Endo/Heme/Allergies: Negative for environmental allergies.  Psychiatric/Behavioral: Negative for depression, suicidal ideas and hallucinations.   All other systems reviewed and are negative.  Physical Exam: Blood pressure 138/78, pulse 91, height '5\' 7"'  (1.702 m), weight (!) 315 lb (142.9 kg), SpO2 94 %. Gen:      No acute distress HEENT:  EOMI, sclera anicteric Neck:     No masses; no thyromegaly Lungs:    Clear to auscultation bilaterally; normal respiratory effort CV:         Regular rate and rhythm; no murmurs Abd:      + bowel sounds; soft, non-tender; no palpable masses, no distension Ext:    No edema; adequate peripheral perfusion Skin:      Warm and dry; no rash Neuro: alert and oriented x 3 Psych: normal mood and affect  Data Reviewed: FENO  06/05/16- 17  10/28/17- 13  Chest x-ray 06/02/16- no active cardio pulmonary disease Chest x-ray 07/25/17- stable cardiomegaly.  No active cardiopulmonary disease.  PFTs 07/24/16 FVC 2.17 (69%), FEV1 1.86 (75%), F/F 86, TLC 71%, DLCO 77%, DLCO/VA 132%  Allergy test 06/24/16- sensitivity to cockroach, ragweed, dust mite. IgE 170  Sleep study 12/06/15, AHI 43.6, SaO2 low 55%.  Echocardiogram 07/23/2017-Left ventricle: The cavity size was normal. Wall thickness was   increased in a pattern of mild LVH. Systolic function was normal.   The estimated ejection fraction was in the range of 55% to 60%.  Assessment:  Chronic upper airway cough Dyspnea Likely postinfectious from bronchitis with contribution from postnasal drip, acid reflux, untreated OSA Suppression for asthma is low as PFTs did not show any obstruction, FENO is low and she has not responded to inhaler, steroids in the past PFTs do show mild restriction with TLC impairment that corrects for alveolar volume.  I suspect these reductions are related to obesity and body habitus.  There is no evidence of interstitial lung disease.  Encouraged weight loss and exercise.  Continue on chlorpheniramine antihistamine, Dymista nasal spray, cough syrup, Protonix.  Add Singulair. She is concerned about food allergies as certain foods  causes increased symptoms.  Will refer her to allergy for further evaluation.  Untreated OSA She is declined getting a repeat sleep study or retry CPAP.[name]  CPAP has been taken away there was a break in service. Asked her to follow-up with Dr. Halford Chessman for further evaluation and consideration for dental device.   Plan/Recommendations: - Continue antacid med, chlorpheniramine, dymista - Start Singulair, allergy referral. - Sleep clinic follow up. - Weight loss exercise  Marshell Garfinkel MD [facility] 05/30/2020, 9:07 AM  CC: Panosh, Standley Brooking, MD

## 2020-06-07 ENCOUNTER — Other Ambulatory Visit: Payer: Self-pay | Admitting: Internal Medicine

## 2020-06-20 ENCOUNTER — Other Ambulatory Visit: Payer: Self-pay | Admitting: Pulmonary Disease

## 2020-07-14 ENCOUNTER — Other Ambulatory Visit: Payer: Self-pay | Admitting: Internal Medicine

## 2020-07-18 ENCOUNTER — Ambulatory Visit: Payer: No Typology Code available for payment source

## 2020-10-17 ENCOUNTER — Other Ambulatory Visit: Payer: Self-pay | Admitting: Internal Medicine

## 2020-10-19 ENCOUNTER — Telehealth: Payer: Self-pay | Admitting: Pulmonary Disease

## 2020-10-19 NOTE — Telephone Encounter (Signed)
Attempted to call pt but unable to reach. Left message for her to return call. 

## 2020-10-20 NOTE — Telephone Encounter (Signed)
Pt is returning phone call and stated that she is wanting their to be instructions to be left on her voicemail and not to be left as "this is a doctors office please call back". Pt stated that she is just needing advice on what to do for her cough and SOB and she stated that she still has the Breo inhaler but she said that it causes her Blood Sugar to go up and she said that her blood sugar is up pretty high right now and she said that she is just wanting to know is there an alternative that she can take that doesn't cause her Blood sugar to increase as a side effect. Pls regard; [phone]

## 2020-10-20 NOTE — Telephone Encounter (Signed)
Left message for patient to call back  

## 2020-10-23 ENCOUNTER — Ambulatory Visit: Payer: No Typology Code available for payment source | Admitting: Family Medicine

## 2020-10-23 ENCOUNTER — Other Ambulatory Visit: Payer: Self-pay

## 2020-10-23 VITALS — BP 142/70 | HR 90 | Temp 98.3°F | Wt 307.3 lb

## 2020-10-23 DIAGNOSIS — R479 Unspecified speech disturbances: Secondary | ICD-10-CM

## 2020-10-23 DIAGNOSIS — R2689 Other abnormalities of gait and mobility: Secondary | ICD-10-CM

## 2020-10-23 NOTE — Progress Notes (Signed)
Established Patient Office Visit  Subjective:  Patient ID: [name], female    DOB: 1960-05-07  Age: 60 y.o. MRN: [id number]  CC:  Chief Complaint  Patient presents with   Ear Fullness    R ear, feels like fluid in the ear, can hear liquid moving when she tilts her head, balance is off    HPI NOHEA KRAS presents for initial complaint of sensation of fullness in the right ear.  She states that used to swim years ago and this feels much similar to when she had fluid in her ear from swimming.  No recent swimming.  No recent flying.  She feels like for a few weeks now she has felt "off balance ".  She states on a couple of occasions she has been walking slowly and actually ran into the wall.  Her husband noticed similar issue.  She denies any vertigo.  Works in Therap[employer], art and for the past 3 weeks has had intermittent issues with difficulty with slurred speech.  Denies any focal weakness.  No confusion.  She also for over a years had intermittent problems of feeling like she cannot swallow well specially with weakness on the left side.  No recent cognitive changes. No facial droop.   Denies any acute visual changes.  She does have some sinus congestive symptoms and intermittently takes nasal spray for that.  She has used Dymista in the past.  No recent purulent nasal secretions.  She has type 2 diabetes which has been poorly controlled.  She basically took her self off metformin secondary to diarrhea symptoms.  She needs to get into see her primary care regarding this.      Past Medical History:  Diagnosis Date   Allergic rhinitis    Diabetes mellitus    Hyperlipidemia    Hypertension    x many years (since birht of her son at 86) Had workup with renal artery angiogram that showed no evidence for fibromuscular dysplasia ( no significant renal artery stenosis.) ECHO (8/11) EF 60-65%, mild LVH, no regional WMAs.   Low back pain    Morbid obesity ([facility])    OSA  (obstructive sleep apnea) 12/28/2015    Past Surgical History:  Procedure Laterality Date   ABDOMINAL HYSTERECTOMY     partial for fibroids   TUBAL LIGATION      Family History  Problem Relation Age of Onset   Cancer Mother    Hypertension Mother    Other Father        died in [place]    Social History   Socioeconomic History   Marital status: Married    Spouse name: Not on file   Number of children: Not on file   Years of education: Not on file   Highest education level: Not on file  Occupational History   Not on file  Tobacco Use   Smoking status: Never   Smokeless tobacco: Never  Vaping Use   Vaping Use: Never used  Substance and Sexual Activity   Alcohol use: No    Alcohol/week: 0.0 standard drinks   Drug use: No   Sexual activity: Not on file  Other Topics Concern   Not on file  Social History Narrative   Married   Regular exercise-yes   HH of 4    26 and 22    No pets   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 Strain:  Not on file  Food Insecurity: Not on file  Transportation Needs: Not on file  Physical Activity: Not on file  Stress: Not on file  Social Connections: Not on file  Intimate Partner Violence: Not on file    Outpatient Medications Prior to Visit  Medication Sig Dispense Refill   albuterol (VENTOLIN HFA) 108 (90 Base) MCG/ACT inhaler INHALE 1-2 PUFFS INTO THE LUNGS 2 (TWO) TIMES DAILY AS NEEDED FOR WHEEZING OR SHORTNESS OF BREATH. 6.7 each 0   Ascorbic Acid (VITAMIN C PO) Take 1 tablet by mouth at bedtime.     Azelastine-Fluticasone (DYMISTA) 137-50 MCG/ACT SUSP Place 2 sprays into the nose daily. 23 g 0   benzonatate (TESSALON) 100 MG capsule Take 1 capsule (100 mg total) by mouth 2 (two) times daily as needed for cough. 30 capsule 0   BREO ELLIPTA 200-25 MCG/INH AEPB TAKE 1 PUFF BY MOUTH EVERY DAY 60 each 5   calcium carbonate (OSCAL) 1500 (600 Ca) MG TABS tablet Take 600 mg of elemental calcium  by mouth daily.     felodipine (PLENDIL) 10 MG 24 hr tablet TAKE 1 TABLET BY MOUTH EVERY DAY 90 tablet 0   glucose blood (ACCU-CHEK GUIDE) test strip Use as instructed to check sugar 3 times daily 300 each 1   KLOR-CON M20 20 MEQ tablet TAKE 1 TABLET BY MOUTH EVERY DAY 90 tablet 0   Lancets Misc. (ACCU-CHEK MULTICLIX LANCET DEV) KIT Use as instructed to check sugar 3 times daily 1 each prn   Magnesium 250 MG TABS Take 1 tablet by mouth daily.     metFORMIN (GLUCOPHAGE-XR) 500 MG 24 hr tablet TAKE 2 TABLETS (1,000 MG TOTAL) BY MOUTH DAILY. 180 tablet 1   Multiple Vitamin (MULTIVITAMIN WITH MINERALS) TABS tablet Take 1 tablet by mouth at bedtime. One a Day 50 plus     MULTIPLE VITAMIN PO Take by mouth daily.     ONE TOUCH LANCETS MISC Use to check 3 times daily 300 each 5   pantoprazole (PROTONIX) 40 MG tablet TAKE 1 TABLET BY MOUTH EVERY DAY 90 tablet 0   No facility-administered medications prior to visit.    Allergies  Allergen Reactions   Candesartan Cilexetil Other (See Comments)    Reaction to Atacand - possibly drowsiness or diarrhea or nausea   Codeine Hives, Itching and Swelling    Possible throat swelling - pt does not recall this reaction   Ibuprofen Hives   Telmisartan Other (See Comments)    Reaction to Micardis - possibly drowsiness or diarrhea or nausea   Zithromax [Azithromycin] Diarrhea         ROS Review of Systems  Constitutional:  Negative for chills and fever.  HENT:  Positive for congestion.   Eyes:  Negative for visual disturbance.  Respiratory:  Negative for cough and shortness of breath.   Cardiovascular:  Negative for chest pain.  Neurological:  Positive for dizziness and speech difficulty. Negative for tremors, seizures, syncope, facial asymmetry and weakness.  Psychiatric/Behavioral:  Negative for confusion.      Objective:    Physical Exam Vitals reviewed.  HENT:     Right Ear: Tympanic membrane normal.     Left Ear: Tympanic membrane normal.   Cardiovascular:     Rate and Rhythm: Normal rate and regular rhythm.  Pulmonary:     Effort: Pulmonary effort is normal.     Breath sounds: Normal breath sounds.  Musculoskeletal:     Cervical back: Neck supple.  Neurological:  General: No focal deficit present.     Mental Status: She is alert and oriented to person, place, and time.     Cranial Nerves: No cranial nerve deficit.     Motor: No weakness.     Coordination: Coordination normal.     Gait: Gait normal.     Comments: Normal cerebellar function by finger-nose testing.    BP (!) 142/70 (BP Location: Left Arm, Patient Position: Sitting, Cuff Size: Large)   Pulse 90   Temp 98.3 F (36.8 C) (Oral)   Wt (!) 307 lb 4.8 oz (139.4 kg)   SpO2 97%   BMI 48.13 kg/m  Wt Readings from Last 3 Encounters:  10/23/20 (!) 307 lb 4.8 oz (139.4 kg)  05/30/20 (!) 309 lb (140.2 kg)  01/26/20 (!) 306 lb 3.2 oz (138.9 kg)     Health Maintenance Due  Topic Date Due   PNEUMOCOCCAL POLYSACCHARIDE VACCINE AGE 84-64 HIGH RISK  Never done   COVID-19 Vaccine (1) Never done   Pneumococcal Vaccine 32-52 Years old (1 - PCV) Never done   Zoster Vaccines- Shingrix (1 of 2) Never done   TETANUS/TDAP  08/31/2006   COLONOSCOPY (Pts 45-78yrs Insurance coverage will need to be confirmed)  Never done   MAMMOGRAM  01/24/2013   PAP SMEAR-Modifier  03/09/2017   FOOT EXAM  06/26/2017   URINE MICROALBUMIN  05/26/2019   HEMOGLOBIN A1C  07/26/2020    There are no preventive care reminders to display for this patient.  Lab Results  Component Value Date   TSH 1.71 01/27/2020   Lab Results  Component Value Date   WBC 6.6 01/27/2020   HGB 13.9 01/27/2020   HCT 41.5 01/27/2020   MCV 93.3 01/27/2020   PLT 367 01/27/2020   Lab Results  Component Value Date   NA 139 01/27/2020   K 3.6 01/27/2020   CO2 29 01/27/2020   GLUCOSE 193 (H) 01/27/2020   BUN 24 01/27/2020   CREATININE 1.01 01/27/2020   BILITOT 0.4 01/27/2020   ALKPHOS 94 03/16/2019    AST 15 01/27/2020   ALT 19 01/27/2020   PROT 7.2 01/27/2020   ALBUMIN 3.9 03/16/2019   CALCIUM 9.2 01/27/2020   ANIONGAP 12 06/02/2016   GFR 72.71 03/16/2019   Lab Results  Component Value Date   CHOL 181 01/27/2020   Lab Results  Component Value Date   HDL 49 (L) 01/27/2020   Lab Results  Component Value Date   LDLCALC 107 (H) 01/27/2020   Lab Results  Component Value Date   TRIG 132 01/27/2020   Lab Results  Component Value Date   CHOLHDL 3.7 01/27/2020   Lab Results  Component Value Date   HGBA1C 9.0 (H) 01/27/2020      Assessment & Plan:   Patient presents with several week history of feeling like her balance is off and intermittent difficulty pronouncing words/?slurred speech.  Ear exam was normal with no obvious effusion.  This does not sound like classic vertigo.  Concerning is the hx of intermittent problems with balance and intermittent speech difficulties.   Have recommended she take OTC nasal spray such as Flonase and also consider anti-histamine such as Claritan or Allegra.    Set up MRI brain to further assess speech impairment and balance issue.   Need to rule out cerebellar cause for symptoms above.    No orders of the defined types were placed in this encounter.   Follow-up: No follow-ups on file.    Darnell Level  Elease Hashimoto, MD

## 2020-10-23 NOTE — Addendum Note (Signed)
Addended by: Kristian Covey on: 10/23/2020 05:05 PM   Modules accepted: Orders

## 2020-10-23 NOTE — Patient Instructions (Signed)
Go ahead and try the Flonase and possibly OTC anti-histamine such as Claritan or Zyrtec  I am setting up the MRI brain to evaluate for neurologic problem accounting for recent speech and walking difficulty.

## 2020-10-24 NOTE — Telephone Encounter (Signed)
Lmtcb for pt.  

## 2020-10-26 ENCOUNTER — Telehealth: Payer: No Typology Code available for payment source | Admitting: Family Medicine

## 2020-10-27 ENCOUNTER — Ambulatory Visit: Payer: No Typology Code available for payment source | Admitting: Primary Care

## 2020-11-02 ENCOUNTER — Telehealth: Payer: Self-pay | Admitting: Internal Medicine

## 2020-11-02 NOTE — Telephone Encounter (Signed)
Called and spoke to pt. Pt states she is feeling much better and canceled her appt she had scheduled. Pt aware to call back if she begins to feel worse. Nothing further needed at this time.

## 2020-11-02 NOTE — Telephone Encounter (Signed)
The patient's sugar levels are high; her diabetic medication gives her diarrhea; her levels are in the  400; she needs to discuss her diabetic medicine - [phone].

## 2020-11-02 NOTE — Telephone Encounter (Signed)
Can you advise in pcp's absence? The only medication pt is taking is Metformin.

## 2020-11-02 NOTE — Telephone Encounter (Signed)
I called and spoke with patient. She is not able to come in today or tomorrow. She is going to keep monitoring her blood sugars over the weekend. She is going to reach out to [facility] to see if she can set up an appointment with one of their Endocrinologists. She will call us back next week with the readings of her blood sugars. She has been walking and going to the gym, which has helped to lower her sugars some. She will call back if anything changes.   FYI to Dr. Fabian Sharp & Mykal.

## 2020-11-03 NOTE — Telephone Encounter (Signed)
Noted  

## 2020-11-09 NOTE — Telephone Encounter (Signed)
I agree   I haven't been able   to help her get her dm under control.  And advise a specialty management

## 2020-11-14 ENCOUNTER — Ambulatory Visit: Payer: No Typology Code available for payment source | Admitting: Family Medicine

## 2020-11-14 ENCOUNTER — Encounter: Payer: Self-pay | Admitting: Family Medicine

## 2020-11-14 ENCOUNTER — Other Ambulatory Visit: Payer: Self-pay

## 2020-11-14 VITALS — BP 170/98 | HR 92 | Resp 16 | Ht 67.0 in | Wt 302.0 lb

## 2020-11-14 DIAGNOSIS — E876 Hypokalemia: Secondary | ICD-10-CM

## 2020-11-14 DIAGNOSIS — E1165 Type 2 diabetes mellitus with hyperglycemia: Secondary | ICD-10-CM

## 2020-11-14 DIAGNOSIS — I10 Essential (primary) hypertension: Secondary | ICD-10-CM

## 2020-11-14 DIAGNOSIS — R079 Chest pain, unspecified: Secondary | ICD-10-CM | POA: Diagnosis not present

## 2020-11-14 DIAGNOSIS — G4733 Obstructive sleep apnea (adult) (pediatric): Secondary | ICD-10-CM

## 2020-11-14 DIAGNOSIS — R519 Headache, unspecified: Secondary | ICD-10-CM

## 2020-11-14 LAB — BASIC METABOLIC PANEL
BUN: 22 mg/dL (ref 6–23)
CO2: 35 mEq/L — ABNORMAL HIGH (ref 19–32)
Calcium: 10.2 mg/dL (ref 8.4–10.5)
Chloride: 97 mEq/L (ref 96–112)
Creatinine, Ser: 0.9 mg/dL (ref 0.40–1.20)
GFR: 70.82 mL/min (ref >60.00)
Glucose, Bld: 160 mg/dL — ABNORMAL HIGH (ref 70–99)
Potassium: 3.5 mEq/L (ref 3.5–5.1)
Sodium: 139 mEq/L (ref 135–145)

## 2020-11-14 LAB — POCT GLYCOSYLATED HEMOGLOBIN (HGB A1C): Hemoglobin A1C: 10 % — AB (ref 4.0–5.6)

## 2020-11-14 MED ORDER — OLMESARTAN MEDOXOMIL 20 MG PO TABS
20.0000 mg | ORAL_TABLET | Freq: Every day | ORAL | 1 refills | Status: DC
Start: 2020-11-14 — End: 2020-12-08

## 2020-11-14 MED ORDER — DAPAGLIFLOZIN PROPANEDIOL 10 MG PO TABS: 10.0000 mg | ORAL_TABLET | Freq: Every day | ORAL | 1 refills | Status: DC

## 2020-11-14 NOTE — Progress Notes (Signed)
ACUTE VISIT Chief Complaint  Patient presents with   Hypertension   HPI: [name] is a 60 y.o. female with hx of poorly controlled DM II,obesity,OSA,HTN,hypoK+,and hyperaldosteronism here today concerned about elevated BP for a week or so. She is also concerned about elevated BS's. DM II: She is not on pharmacologic treatment. BS's 300's. Negative for abdominal pain, vomiting, polydipsia,polyuria, or polyphagia. Mild nausea today.  Lab Results  Component Value Date   HGBA1C 9.0 (H) 01/27/2020   She started exercising a few days ago. Planning on meeting with nutritionist as part of gym membership. In general she feels like she follows a healthful diet.  Metformin caused diarrhea with episodes of stool incontinence. She did not follow with PCP.  HTN: She is not taking antihypertensive medications. States that Felodipine caused LE edema, improved after she discontinued medication. he has tried a few other antihypertensive medications and has had side effects: Drowsiness, falling asleep while driving. OSA, she is not wearing her CPAP. BP yesterday 182/98. States that little over a week ago she had BP's 130/80's. She is taking protein supplementation, which she thinks it may be elevating BP's.  For the past 3 weeks she has had intermittent frontal headache, today it was "unbearable", she took the day off. No associated acute visual changes,N/V,photophobia,or focal neurologic deficit. She has not taken OTC treatments. She has not identified exacerbating or alleviating factors.  Mid CP intermittently for a week, not daily. Achy like pain. She has not identified exacerbating factors. Happens when she is resting in bed. Not radiated. Not associated with palpitations,SOB,or diaphoresis. Hx of abnormal EKG.  HypoK+: She is on KLOR 20 meq daily.  Lab Results  Component Value Date   CREATININE 1.01 01/27/2020   BUN 24 01/27/2020   NA 139 01/27/2020   K 3.6  01/27/2020   CL 97 (L) 01/27/2020   CO2 29 01/27/2020   Review of Systems  Constitutional:  Positive for fatigue. Negative for activity change, appetite change and fever.  HENT:  Negative for mouth sores, nosebleeds, sore throat and trouble swallowing.   Respiratory:  Negative for cough and wheezing.   Gastrointestinal:        Negative for changes in bowel habits.  Genitourinary:  Negative for decreased urine volume, dysuria and hematuria.  Musculoskeletal:  Negative for gait problem and myalgias.  Skin:  Negative for pallor and rash.  Allergic/Immunologic: Positive for environmental allergies.  Neurological:  Negative for syncope, facial asymmetry and weakness.  Psychiatric/Behavioral:  Negative for confusion. The patient is nervous/anxious.   Rest see pertinent positives and negatives per HPI.  Current Outpatient Medications on File Prior to Visit  Medication Sig Dispense Refill   albuterol (VENTOLIN HFA) 108 (90 Base) MCG/ACT inhaler INHALE 1-2 PUFFS INTO THE LUNGS 2 (TWO) TIMES DAILY AS NEEDED FOR WHEEZING OR SHORTNESS OF BREATH. 6.7 each 0   Ascorbic Acid (VITAMIN C PO) Take 1 tablet by mouth at bedtime.     Azelastine-Fluticasone (DYMISTA) 137-50 MCG/ACT SUSP Place 2 sprays into the nose daily. 23 g 0   BREO ELLIPTA 200-25 MCG/INH AEPB TAKE 1 PUFF BY MOUTH EVERY DAY 60 each 5   calcium carbonate (OSCAL) 1500 (600 Ca) MG TABS tablet Take 600 mg of elemental calcium by mouth daily.     glucose blood (ACCU-CHEK GUIDE) test strip Use as instructed to check sugar 3 times daily 300 each 1   KLOR-CON M20 20 MEQ tablet TAKE 1 TABLET BY MOUTH EVERY DAY 90  tablet 0   Lancets Misc. (ACCU-CHEK MULTICLIX LANCET DEV) KIT Use as instructed to check sugar 3 times daily 1 each prn   Magnesium 250 MG TABS Take 1 tablet by mouth daily.     Multiple Vitamin (MULTIVITAMIN WITH MINERALS) TABS tablet Take 1 tablet by mouth at bedtime. One a Day 50 plus     MULTIPLE VITAMIN PO Take by mouth daily.      ONE TOUCH LANCETS MISC Use to check 3 times daily 300 each 5   pantoprazole (PROTONIX) 40 MG tablet TAKE 1 TABLET BY MOUTH EVERY DAY 90 tablet 0   No current facility-administered medications on file prior to visit.   Past Medical History:  Diagnosis Date   Allergic rhinitis    Diabetes mellitus    Hyperlipidemia    Hypertension    x many years (since birht of her son at 74) Had workup with renal artery angiogram that showed no evidence for fibromuscular dysplasia ( no significant renal artery stenosis.) ECHO (8/11) EF 60-65%, mild LVH, no regional WMAs.   Low back pain    Morbid obesity (HCC)    OSA (obstructive sleep apnea) 12/28/2015   Allergies  Allergen Reactions   Candesartan Cilexetil Other (See Comments)    Reaction to Atacand - possibly drowsiness or diarrhea or nausea   Codeine Hives, Itching and Swelling    Possible throat swelling - pt does not recall this reaction   Ibuprofen Hives   Telmisartan Other (See Comments)    Reaction to Micardis - possibly drowsiness or diarrhea or nausea   Zithromax [Azithromycin] Diarrhea         Social History   Socioeconomic History   Marital status: Married    Spouse name: Not on file   Number of children: Not on file   Years of education: Not on file   Highest education level: Not on file  Occupational History   Not on file  Tobacco Use   Smoking status: Never   Smokeless tobacco: Never  Vaping Use   Vaping Use: Never used  Substance and Sexual Activity   Alcohol use: No    Alcohol/week: 0.0 standard drinks   Drug use: No   Sexual activity: Not on file  Other Topics Concern   Not on file  Social History Narrative   Married   Regular exercise-yes   HH of 4    28 and 24    No pets   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 Strain: Not on file  Food Insecurity: Not on file  Transportation Needs: Not on file  Physical Activity: Not on file  Stress: Not on file  Social  Connections: Not on file   Vitals:   11/14/20 1356  BP: (!) 170/98  Pulse: 92  Resp: 16  SpO2: 97%   Body mass index is 47.3 kg/m.  Physical Exam Vitals and nursing note reviewed.  Constitutional:      General: She is not in acute distress.    Appearance: She is well-developed.  HENT:     Head: Normocephalic and atraumatic.     Mouth/Throat:     Mouth: Mucous membranes are moist.     Pharynx: Oropharynx is clear.  Eyes:     Conjunctiva/sclera: Conjunctivae normal.  Cardiovascular:     Rate and Rhythm: Normal rate and regular rhythm.     Pulses:          Dorsalis  pedis pulses are 2+ on the right side and 2+ on the left side.     Heart sounds: No murmur heard. Pulmonary:     Effort: Pulmonary effort is normal. No respiratory distress.     Breath sounds: Normal breath sounds.  Abdominal:     Palpations: Abdomen is soft. There is no hepatomegaly or mass.     Tenderness: There is no abdominal tenderness.  Musculoskeletal:     Right lower leg: 2+ Pitting Edema present.     Left lower leg: 2+ Pitting Edema present.  Lymphadenopathy:     Cervical: No cervical adenopathy.  Skin:    General: Skin is warm.     Findings: No erythema or rash.  Neurological:     General: No focal deficit present.     Mental Status: She is alert and oriented to person, place, and time.     Cranial Nerves: No cranial nerve deficit.     Gait: Gait normal.  Psychiatric:        Mood and Affect: Mood is anxious.     Comments: Well groomed, good eye contact.   ASSESSMENT AND PLAN:  Ms.Jules was seen today for hypertension.  Diagnoses and all orders for this visit: Orders Placed This Encounter  Procedures   Basic metabolic panel   POC HgB U2P   EKG 12-Lead   Lab Results  Component Value Date   HGBA1C 10.0 (A) 11/14/2020   Lab Results  Component Value Date   CREATININE 0.90 11/14/2020   BUN 22 11/14/2020   NA 139 11/14/2020   K 3.5 11/14/2020   CL 97 11/14/2020   CO2 35 (H)  11/14/2020   Uncontrolled type 2 diabetes mellitus with hyperglycemia ([place]) HgA1C is not at goal. We discussed possible complications of elevated glucose. Metformin caused diarrhea.States that Wilder Glade was recommended in the past but she did not try, Rx for Farxiga 10 mg sent.Some side effects discussed. Continue monitoring BS's. Eye exam is current.  -     dapagliflozin propanediol (FARXIGA) 10 MG TABS tablet; Take 1 tablet (10 mg total) by mouth daily before breakfast.  Chest pain, unspecified type Possible etiologies discussed. EKG today: SR, LAD,unspecific T wave abnormalities,? IVCD. Otherwise no significant changes when compared with EKG 06/03/16. Clearly instructed about warning signs. F/U with PCP in 3-4 weeks. She voices understanding and agrees with plan.  Essential hypertension She does not want meds that increase urinary frequency,Spironolactone is a good option. She agrees with trying Olmesartan 20 mg daily. Low salt diet and wt loss will also help. Continue monitoring BP. Instructed about warning signs. F/U with PCP in 3-4 weeks,before of needed.  -     olmesartan (BENICAR) 20 MG tablet; Take 1 tablet (20 mg total) by mouth daily.  OSA (obstructive sleep apnea) Educated about Dx and possible complications if not adequately treated. Some of her problems could be caused by this problem. Encouraged wearing CPAP.  HYPOKALEMIA Continue KLOR 20 meq daily. Further recommendations according to BMP.  Headache, unspecified headache type We discussed possible etiologies. ? OSA,tension headache,HTN among some. Neurologic exam today negative. Brain MRI was recently ordered,planning on re-scheduling.  Spent 54 minutes.  During this time history was obtained and documented, examination was performed, prior labs reviewed, and assessment/plan discussed.  Return in about 4 weeks (around 12/12/2020) for HTN with Dr Regis Bill.   Eitan Doubleday G. [place], MD  [facility]. [place] office.

## 2020-11-14 NOTE — Patient Instructions (Addendum)
A few things to remember from today's visit:   Uncontrolled type 2 diabetes mellitus with hyperglycemia (HCC) - Plan: POC HgB A1c  Chest pain, unspecified type - Plan: EKG 12-Lead  Essential hypertension  OSA (obstructive sleep apnea)  Today Olmesartan 20 mg for blood pressure and farxiga 10 mg daily for diabetes. You can take blood pressure medication at night.  Do not use My Chart to request refills or for acute issues that need immediate attention.   Sleeps apnea can be contributing to drowsiness and fatigue. Consider wearing your CPAP.  Please arrange your head MRI. Continue monitoring blood pressure and blood sugars. Labs in 7-10 days.  Please be sure medication list is accurate. If a new problem present, please set up appointment sooner than planned today.

## 2020-11-16 ENCOUNTER — Telehealth: Payer: Self-pay | Admitting: Internal Medicine

## 2020-11-16 NOTE — Telephone Encounter (Signed)
Pt is calling in stating that she really appreciate Dr. [place] listening to her on Tuesday and changing her medications she is not having anymore diarrhea and swelling of the legs and feet.  Pt stated that she really really love the way that she is feeling now.  Pt just wanted to let Dr. [place] know that she really appreciate all that she did for her.

## 2020-11-17 NOTE — Telephone Encounter (Signed)
fyi

## 2020-12-06 ENCOUNTER — Other Ambulatory Visit: Payer: Self-pay | Admitting: Family Medicine

## 2020-12-08 NOTE — Telephone Encounter (Signed)
Can refill x  30  days   Need OV to fu  BP and BG readings   .

## 2020-12-08 NOTE — Telephone Encounter (Signed)
Spoke with the pt and informed her to schedule OV in order to receive more refills. Pt stated she would call back to schedule.

## 2021-01-01 ENCOUNTER — Other Ambulatory Visit: Payer: Self-pay | Admitting: Internal Medicine

## 2021-01-05 ENCOUNTER — Other Ambulatory Visit: Payer: Self-pay | Admitting: Family Medicine

## 2021-01-08 NOTE — Telephone Encounter (Signed)
Ok to refill x 2 months  make appt  before runs out.

## 2021-01-15 ENCOUNTER — Telehealth: Payer: Self-pay | Admitting: Internal Medicine

## 2021-01-15 NOTE — Telephone Encounter (Signed)
PT called to advise that while taking the olmesartan (BENICAR) 20 MG tablet it has made her feel very sick as if she was dying and putting her organs under a lot of stress. PT states that the previous med she took for 20 years caused swelling in her legs as well. PT would like a new BP med that causes no swelling and does not make her have the deathly sick feeling. PT has a apt Thursday.

## 2021-01-15 NOTE — Telephone Encounter (Signed)
So dr Sw[place] began this medication In July . And that she felt ok... better  on July 14  see message . I refilled on August by request  and advised FU visit .   She may benefit from begin seen in the hypertension clinic because she has side effects from so many medications.   I will see her this week to discuss . Her concerns .

## 2021-01-15 NOTE — Telephone Encounter (Signed)
Pt informed of the message below and verbalized understanding. 

## 2021-01-15 NOTE — Telephone Encounter (Signed)
She has appt  this week.  Have her check her BP readings before she comes .

## 2021-01-16 ENCOUNTER — Other Ambulatory Visit: Payer: Self-pay

## 2021-01-16 ENCOUNTER — Ambulatory Visit (INDEPENDENT_AMBULATORY_CARE_PROVIDER_SITE_OTHER): Payer: No Typology Code available for payment source | Admitting: Podiatry

## 2021-01-16 ENCOUNTER — Ambulatory Visit: Payer: No Typology Code available for payment source

## 2021-01-16 DIAGNOSIS — L600 Ingrowing nail: Secondary | ICD-10-CM | POA: Diagnosis not present

## 2021-01-16 DIAGNOSIS — M7752 Other enthesopathy of left foot: Secondary | ICD-10-CM | POA: Diagnosis not present

## 2021-01-16 DIAGNOSIS — M79676 Pain in unspecified toe(s): Secondary | ICD-10-CM | POA: Diagnosis not present

## 2021-01-18 ENCOUNTER — Encounter: Payer: Self-pay | Admitting: Internal Medicine

## 2021-01-18 ENCOUNTER — Ambulatory Visit: Payer: No Typology Code available for payment source | Admitting: Internal Medicine

## 2021-01-18 ENCOUNTER — Other Ambulatory Visit: Payer: Self-pay

## 2021-01-18 VITALS — BP 184/96 | HR 84 | Temp 98.1°F | Ht 67.0 in | Wt 295.2 lb

## 2021-01-18 DIAGNOSIS — I10 Essential (primary) hypertension: Secondary | ICD-10-CM | POA: Diagnosis not present

## 2021-01-18 DIAGNOSIS — E1165 Type 2 diabetes mellitus with hyperglycemia: Secondary | ICD-10-CM | POA: Diagnosis not present

## 2021-01-18 DIAGNOSIS — Z79899 Other long term (current) drug therapy: Secondary | ICD-10-CM

## 2021-01-18 DIAGNOSIS — T887XXA Unspecified adverse effect of drug or medicament, initial encounter: Secondary | ICD-10-CM | POA: Diagnosis not present

## 2021-01-18 DIAGNOSIS — G4733 Obstructive sleep apnea (adult) (pediatric): Secondary | ICD-10-CM

## 2021-01-18 MED ORDER — CARVEDILOL 12.5 MG PO TABS: 6.2500 mg | ORAL_TABLET | Freq: Two times a day (BID) | ORAL | 3 refills | Status: DC

## 2021-01-18 NOTE — Patient Instructions (Addendum)
Good to see you today  Continue the farxiga. Glad it helps. Have pharmacy send [place] a refill request when needed.   We can consider adding  another med group such as rybelsus.   Begin carvedilol  and we can adjsut med as  needed beginning low dose  and increasing  Plan rov in 2 mos  for Bp check and hg a1c check  Consider hypertension clinic for help.  Agree write down list of meds tried and side effects that you remember.   Look into other ideas about sleep apnea  interventions to help .

## 2021-01-18 NOTE — Progress Notes (Signed)
Chief Complaint  Patient presents with   Follow-up    HPI: [name] 60 y.o. come in for Chronic disease management  Follow-up of blood sugar and hypertension with side effect of medication. Diabetes: She had to stop the metformin because even 500 mg once a day was causing diarrhea.  She has been on [place] since the summer and is pleased with that medication is not causing side effects and she has been able to lose a few pounds and weight. BG  150 130 better  am bg but not this week  went to a wedding and had cake and later some chocolate.  Blood pressure not regularly controlled was given olmesartan 40 mg and after about 3 days felt bad like her insides were dying pressure otherwise hard to describe.  She tried to wait it out for a full 3 weeks but then stopped her symptoms went away almost immediately She tried with or without the [place] and does not think the Wilder Glade was the culprit. There was no itching swelling shortness of breath.  And she does not believe controlled her blood pressure.  She is here today because of follow-up for this.  Update on the sleep apnea she has not pursued the rest her sleep study cost her $3000 and then she was unaware that when she did not use the machine trial on a regular basis it would be taken from her and she would have to start all over.  So not interested in pursuing an expensive path again. ROS: See pertinent positives and negatives per HPI. She is pleased that the medicines have not caused swelling in her legs. Past Medical History:  Diagnosis Date   Allergic rhinitis    Diabetes mellitus    Hyperlipidemia    Hypertension    x many years (since birht of her son at 84) Had workup with renal artery angiogram that showed no evidence for fibromuscular dysplasia ( no significant renal artery stenosis.) ECHO (8/11) EF 60-65%, mild LVH, no regional WMAs.   Low back pain    Morbid obesity (HCC)    OSA (obstructive sleep apnea) 12/28/2015     Family History  Problem Relation Age of Onset   Cancer Mother    Hypertension Mother    Other Father        died in MVA    Social History   Socioeconomic History   Marital status: Married    Spouse name: Not on file   Number of children: Not on file   Years of education: Not on file   Highest education level: Not on file  Occupational History   Not on file  Tobacco Use   Smoking status: Never   Smokeless tobacco: Never  Vaping Use   Vaping Use: Never used  Substance and Sexual Activity   Alcohol use: No    Alcohol/week: 0.0 standard drinks   Drug use: No   Sexual activity: Not on file  Other Topics Concern   Not on file  Social History Narrative   Married   Regular exercise-yes   HH of 4    26 and 22    No pets   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 6.7 each 0   Ascorbic Acid (VITAMIN C PO) Take 1 tablet by mouth at bedtime.     Azelastine-Fluticasone (DYMISTA) 137-50 MCG/ACT SUSP Place 2 sprays into the nose daily. 23 g 0   BREO ELLIPTA 200-25 MCG/INH AEPB TAKE 1 PUFF BY MOUTH EVERY DAY 60 each 5   calcium carbonate (OSCAL) 1500 (600 Ca) MG TABS tablet Take 600 mg of elemental calcium by mouth daily.     FARXIGA 10 MG TABS tablet TAKE 1 TABLET BY MOUTH DAILY BEFORE BREAKFAST. 30 tablet 1   glucose blood (ACCU-CHEK GUIDE) test strip Use as instructed to check sugar 3 times daily 300 each 1   KLOR-CON M20 20 MEQ tablet TAKE 1 TABLET BY MOUTH EVERY DAY 90 tablet 0   Lancets Misc. (ACCU-CHEK MULTICLIX LANCET DEV) KIT  Use as instructed to check sugar 3 times daily 1 each prn   Magnesium 250 MG TABS Take 1 tablet by mouth daily.     Multiple Vitamin (MULTIVITAMIN WITH MINERALS) TABS tablet Take 1 tablet by mouth at bedtime. One a Day 50 plus     MULTIPLE VITAMIN PO Take by mouth daily.     ONE TOUCH LANCETS MISC Use to check 3 times daily 300 each 5   pantoprazole (PROTONIX) 40 MG tablet TAKE 1 TABLET BY MOUTH EVERY DAY 90 tablet 0   olmesartan (BENICAR) 20 MG tablet TAKE 1 TABLET BY MOUTH EVERY DAY 30 tablet 0   No facility-administered medications prior to visit.     EXAM:  BP (!) 184/96 (BP Location: Left Arm, Patient Position: Sitting, Cuff Size: Large)   Pulse 84   Temp 98.1 F (36.7 C) (Oral)   Ht '5\' 7"'  (1.702 m)   Wt 295 lb 3.2 oz (133.9 kg)   SpO2 96%   BMI 46.23 kg/m   Body mass index is 46.23 kg/m. Repeat blood pressure 190 88 left arm large cuff  GENERAL: vitals reviewed and listed above, alert, oriented, appears well hydrated and in no acute distress HEENT: atraumatic, conjunctiva  clear, no obvious abnormalities on inspection of external nose and ears OP : Masked NECK: no obvious masses on inspection palpation  LUNGS: clear to auscultation bilaterally, no wheezes, rales or rhonchi, good air movement CV: HRRR, no clubbing cyanosis or  nl cap refill  MS: moves all extremities without noticeable focal  abnormality PSYCH: pleasant and cooperative, no obvious depression or anxiety Lab Results  Component Value Date   WBC 6.6 01/27/2020   HGB 13.9 01/27/2020   HCT 41.5 01/27/2020   PLT 367 01/27/2020   GLUCOSE 160 (H) 11/14/2020   CHOL 181 01/27/2020   TRIG 132 01/27/2020   HDL 49 (L) 01/27/2020   LDLCALC 107 (H) 01/27/2020   ALT 19 01/27/2020   AST 15 01/27/2020   NA 139 11/14/2020   K 3.5 11/14/2020   CL 97 11/14/2020   CREATININE 0.90 11/14/2020   BUN 22 11/14/2020   CO2 35 (H) 11/14/2020   TSH 1.71 01/27/2020   INR 1.0 RATIO 07/08/2006   HGBA1C 10.0 (A) 11/14/2020    MICROALBUR 2.4 (H) 05/25/2018   BP Readings from Last 3 Encounters:  01/18/21 (!) 184/96  11/14/20 (!) 170/98  10/23/20 (!) 142/70   Wt Readings from Last 3 Encounters:  01/18/21 295 lb 3.2 oz (133.9 kg)  11/14/20 (!) 302 lb (137 kg)  10/23/20 (!) 307 lb 4.8 oz (139.4 kg)    ASSESSMENT AND PLAN:  Discussed the following assessment and plan:  Uncontrolled type 2 diabetes mellitus with hyperglycemia (HCC)  Essential hypertension - uncontrolled  hx of se of  meds ccb and ace arb class  hypo with diuretic   Medication management  Side effect of medication - olmesartan felt like insides dying .[name]  Morbid obesity, unspecified obesity type ([place])  OSA (obstructive sleep apnea) Fortunately tolerating the [place] quite well and is happy with this although may not be enough to bring her sugar down to reasonable range as she had been in the 300-400 range. Discussed possibility of Rybelsus add on but she wants to wait to come back for her A1c after 13-monthtrial of her current.  Hypertension has been more of a problem because of side effects of medicine or ineffectiveness.  She is going to try to reconstruct a history of number of medicines and side effects.  It appears that she is not going to be able to tolerate any ACE or ARB. And I believe she had some aggravated hypokalemia with diuretics. And swelling with amlodipine type medicine at some point she was on chronic 10 mg of amlodipine and then felodipine.  CCB whether that would occur again on the Farxiga is uncertain.  She is so pleased that she has no leg swelling at this time Trial of carvedilol low-dose go slow increase, this was prescribed in the past but she does not think she ever took it.   Discussed hypertension clinic may be a specialty co-pay We can reassess when she comes back in 1-1/2 to 2 months to decide on that referral adjust as possible. She may not have taken  when the spironolactone when given at some point in the past  unclear cause reason but consider  Consider getting back with sleep apnea team and although cannot afford to do another sleep study asked for other options question positional mouth he says and continue weight loss.  This will help her over all health.   -Patient advised to return or notify health care team  if  new concerns arise.  Patient Instructions  Good to see you today  Continue the farxiga. Glad it helps. Have pharmacy send [place] refill request when needed.   We can consider adding  another med group such as rybelsus.   Begin carvedilol  and we can adjsut med as  needed beginning low dose  and increasing  Plan rov in 2 mos  for Bp check and hg a1c check  Consider hypertension clinic for help.  Agree write down list of meds tried and side effects that you remember.   Look into other ideas about sleep apnea  interventions to help .   WStandley Brooking Qiana Landgrebe M.D.

## 2021-01-22 NOTE — Progress Notes (Signed)
  Subjective:  Patient ID: [name], female    DOB: 06-09-1960,  MRN: [id number]  Chief Complaint  Patient presents with   Toe Pain     NP // left foot Great toe Pain    61 y.o. female presents with the above complaint. History confirmed with patient.  States that the left great toenail is causing her pain.  Diabetic with last A1c of 12.  States her blood sugars hovers around 140.  Objective:  Physical Exam: warm, good capillary refill, no trophic changes or ulcerative lesions, normal DP and PT pulses, and normal sensory exam. Left great toenail with dystrophic nail mild ingrowing areas with pain to palpation  Assessment:   1. Capsulitis of toe of left foot   2. Ingrown nail   3. Pain around toenail      Plan:  Patient was evaluated and treated and all questions answered.  DM, Ingrown nail -Nail gently debrided in slant back fashion to patient relief. -Follow-up should other issues persist -We discussed the importance of keeping her blood sugars low to prevent further pedal issues  No follow-ups on file.

## 2021-01-26 ENCOUNTER — Ambulatory Visit: Payer: Self-pay | Admitting: Podiatry

## 2021-02-05 ENCOUNTER — Other Ambulatory Visit: Payer: Self-pay | Admitting: Internal Medicine

## 2021-02-05 ENCOUNTER — Telehealth: Payer: Self-pay

## 2021-02-05 DIAGNOSIS — I10 Essential (primary) hypertension: Secondary | ICD-10-CM

## 2021-02-05 NOTE — Telephone Encounter (Signed)
Patient called asking to have medication changed back olmesartan 20 Mg the new B/P medications is making pt sick and sleepy. Patient requesting a call back to discuss.

## 2021-02-06 MED ORDER — OLMESARTAN MEDOXOMIL 20 MG PO TABS: 20.0000 mg | ORAL_TABLET | Freq: Every day | ORAL | 0 refills | Status: DC

## 2021-02-06 MED ORDER — POTASSIUM CHLORIDE CRYS ER 20 MEQ PO TBCR: 20.0000 meq | EXTENDED_RELEASE_TABLET | Freq: Every day | ORAL | 0 refills | Status: DC

## 2021-02-06 NOTE — Addendum Note (Signed)
Addended by: Johnella Moloney on: 02/06/2021 08:46 AM   Modules accepted: Orders

## 2021-02-06 NOTE — Telephone Encounter (Signed)
Spoke with patient about message. Very appreciative, voiced understanding .  Nothing further is needed.

## 2021-02-06 NOTE — Addendum Note (Signed)
Addended by: Nolon Lennert on: 02/06/2021 09:32 AM   Modules accepted: Orders

## 2021-02-06 NOTE — Telephone Encounter (Signed)
See below message.  Please advise if okay to send in 90 day supply of Olmesartan 20mg .

## 2021-02-06 NOTE — Telephone Encounter (Signed)
Please send in a 30 day supply of Olmesartan as above (she will need to ask Dr. Fabian Sharp for 90 day supplies)

## 2021-02-06 NOTE — Telephone Encounter (Signed)
Apparently Dr. Fabian Sharp has had a hard time finding a BP medication that she will tolerate. Stop the Carvedilol and call in Olmesartan 20 mg to take once daily, #30 with no rf. She will need to folllow up with Dr. Fabian Sharp when she returns in 2 weeks

## 2021-02-06 NOTE — Telephone Encounter (Signed)
Message also forwarded to PCP

## 2021-02-06 NOTE — Telephone Encounter (Signed)
Spoke with the patient and attempted to inform her of the message below.  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  Stated she feels Dr Fabian Sharp went back over 20 years in giving her this medication as she was told to keep up with how the medications made her feel and she thinks the PCP should keep up with this.  Patient was informed of the message below per Dr Clent Ridges and stated she started taking Olmesartan again herself that was given by another doctor, she thinks maybe Dr [place] and Dr Fabian Sharp will give a medication and she cannot take this due to side effects.  Attempted to schedule the patient for a follow up as indicated by Dr Clent Ridges who reviewed the message in Dr Rosezella [place] absence and she stated she does not know why she has to keep coming in the office for an appt as she cannot continue taking time off from work.  I advised the patient a visit is needed in order to have an examination by the physician and to check her blood pressure here in the office.  Patient stated she has a cuff at home, also asked for a refill on the potassium pill and was told by the pharmacy she could not have any until the doctor sent in a refill, she always gets a 90-day supply of her medication and does not want her insurance to cancel on her.  Patient was advised a 30 day supply of Olmesartan and a 90-day supply of potassium will be sent in to the pharmacy, follow up appt will be scheduled and I disconnected the call.

## 2021-02-16 NOTE — Telephone Encounter (Signed)
Messages are confusing and conflicting with last  Ov information Last visit she said  she couldn't take benicar olmesartan  because  she "felt like her insides were dying" and thought it was stopped.   In regard to the carvedilol  Was she taking 1/2 pill of 12.5  or 6.25 twice a day  ?  \This is  a low dose and if getting tired should   go away with time.   Need to know what her BP is doing.

## 2021-02-16 NOTE — Telephone Encounter (Signed)
Left a message for the patient for my call.

## 2021-02-19 NOTE — Telephone Encounter (Signed)
So olmesartan is a blood pressure medicine and is on her list and it was refilled by request .  Please advise if she is taking olmesartan or not.    And we should take it off her list if this is not accurate.  I suggest we have her seen in the hypertension clinic because she has had so many side effects and problems with any given medicines.  Please do referral  to hypertension clinic for help.   Glad her Bg  is coming down.

## 2021-02-19 NOTE — Addendum Note (Signed)
Addended by: Christy Sartorius on: 02/19/2021 02:58 PM   Modules accepted: Orders

## 2021-02-19 NOTE — Telephone Encounter (Signed)
I spoke with the pt and she stated she is currently not taking Olmesartan as she was uncertain if this medication was causing side effects. Referral to hypertension clinic has been placed and pt is aware.

## 2021-02-19 NOTE — Telephone Encounter (Signed)
I spoke with the pt and she stated she is currently not taking any medication for BP control.  Pt stated that when taking Carvedilol she is unable to remember things accurately and stated she is concerned about this. Pt reported that she has been experiencing sleepiness when taking current dosage of Carvedilol. Pt stated that she is was taking Carvedilol 6.25 mg. Pt informed me that her recent BP has averaged about 150 for her systolic but was unsure about diastolic numbers. Pt also reported that her blood sugars have been about 154 in the morning when she checks her levels.

## 2021-02-21 ENCOUNTER — Telehealth: Payer: No Typology Code available for payment source | Admitting: Internal Medicine

## 2021-02-25 ENCOUNTER — Other Ambulatory Visit: Payer: Self-pay | Admitting: Family Medicine

## 2021-02-27 ENCOUNTER — Telehealth: Payer: Self-pay | Admitting: Internal Medicine

## 2021-02-27 NOTE — Telephone Encounter (Signed)
Pt is calling to let dr Fabian Sharp know she has an appt with cardiologist on 05-15-2021 and will still take bp olmesartan and pt has stop taking farxiga due to memory issues. Please advise

## 2021-03-04 ENCOUNTER — Other Ambulatory Visit: Payer: Self-pay | Admitting: Internal Medicine

## 2021-03-06 ENCOUNTER — Ambulatory Visit: Payer: No Typology Code available for payment source | Admitting: Internal Medicine

## 2021-03-13 ENCOUNTER — Ambulatory Visit: Payer: No Typology Code available for payment source | Admitting: Internal Medicine

## 2021-03-18 ENCOUNTER — Other Ambulatory Visit: Payer: Self-pay | Admitting: Internal Medicine

## 2021-03-19 MED ORDER — OLMESARTAN MEDOXOMIL 20 MG PO TABS: 20.0000 mg | ORAL_TABLET | Freq: Every day | ORAL | 0 refills | Status: DC

## 2021-03-19 NOTE — Telephone Encounter (Signed)
Sent in electronically .  See past  Messages  Says can take even though has side effects.

## 2021-03-19 NOTE — Telephone Encounter (Signed)
Left a message for the pt to return my call in regards to rx.

## 2021-03-20 ENCOUNTER — Ambulatory Visit: Payer: No Typology Code available for payment source | Admitting: Internal Medicine

## 2021-03-27 ENCOUNTER — Ambulatory Visit: Payer: No Typology Code available for payment source | Admitting: Internal Medicine

## 2021-03-28 ENCOUNTER — Ambulatory Visit: Payer: No Typology Code available for payment source | Admitting: Internal Medicine

## 2021-03-28 ENCOUNTER — Encounter: Payer: Self-pay | Admitting: Internal Medicine

## 2021-03-28 ENCOUNTER — Telehealth: Payer: Self-pay | Admitting: Internal Medicine

## 2021-03-28 VITALS — BP 180/90 | HR 82 | Temp 97.7°F | Ht 67.0 in | Wt 299.8 lb

## 2021-03-28 DIAGNOSIS — E1165 Type 2 diabetes mellitus with hyperglycemia: Secondary | ICD-10-CM | POA: Diagnosis not present

## 2021-03-28 DIAGNOSIS — R2689 Other abnormalities of gait and mobility: Secondary | ICD-10-CM | POA: Diagnosis not present

## 2021-03-28 DIAGNOSIS — R413 Other amnesia: Secondary | ICD-10-CM

## 2021-03-28 DIAGNOSIS — Z79899 Other long term (current) drug therapy: Secondary | ICD-10-CM

## 2021-03-28 DIAGNOSIS — I10 Essential (primary) hypertension: Secondary | ICD-10-CM | POA: Diagnosis not present

## 2021-03-28 DIAGNOSIS — T887XXA Unspecified adverse effect of drug or medicament, initial encounter: Secondary | ICD-10-CM

## 2021-03-28 LAB — POCT GLYCOSYLATED HEMOGLOBIN (HGB A1C): Hemoglobin A1C: 9 % — AB (ref 4.0–5.6)

## 2021-03-28 MED ORDER — OLMESARTAN MEDOXOMIL 20 MG PO TABS: 40.0000 mg | ORAL_TABLET | Freq: Every day | ORAL | 0 refills | Status: DC

## 2021-03-28 NOTE — Telephone Encounter (Signed)
Patient called to say the gynecologist office didn't do the creatine test. Patient states that she only did a finger stick test and not the blood draw. Gynecologist office states that a blood draw would've had to have been done to do that, so it is ok to put orders creatine test.       Please advise

## 2021-03-28 NOTE — Telephone Encounter (Signed)
Noted  

## 2021-03-28 NOTE — Telephone Encounter (Signed)
Pt informed of the message and verbalized understanding. Pt inquired if she could have cholesterol levels tested in addition with other labs.  Lab orders have been pended.

## 2021-03-28 NOTE — Patient Instructions (Addendum)
Repeat BP was 178/80 r arm . Want the BP lower  considering   other meds   since so many caused side effects .   Still consider Bp clininc.  Increase  olmesartan to 40 mg per day .  ( 2 x 20)  and  let us know next week if all ok and we can send in olmesartan 40 mg . To take daily    Other meds hydralazine  consider trying spironolactone again    Go back on  farxiga   I dont think causing the  symptoms .of concern  Will or der MRI and referral to neurology about the memory lapses and some balance issues .  May need another  lab tests  Get copy of labs done at your gyne   need creatinine   ( chem panel )   for kidney function.   Plan ROV or fu     virtual  ok about BP .   And then after  neurology consult.

## 2021-03-28 NOTE — Telephone Encounter (Signed)
Okay I placed the order for blood work that includes a kidney function but also added on a B12 level and thyroid which if off can affect your memory and balance.  Either way it is just a blood test you do not have to fast Please get appointment time to get your blood drawn.

## 2021-03-28 NOTE — Telephone Encounter (Signed)
See  office visit nov 22

## 2021-03-28 NOTE — Progress Notes (Signed)
Chief Complaint  Patient presents with   Follow-up   Medication Management   Hypertension    HPI: [name] 60 y.o. come in for Chronic disease management  Diabetes she is continuing to alter her intake diet and trying to be active  Stopped the farxiga    a couple months ago because of concerns about side effect of memory at times she has a problem with missing words word finding and would leave her ATM card in the machine.  She read on Farxiga and said that memory could be a side effect of the medicine so stopped it however her sugars have risen since that time up into the 200 range and her symptoms did not improve so she is willing to go back on the medicine.  Blood pressure states that the readings were better in the OB/GYN's office 138/96 a couple times tolerating the olmesartan 20 mg makes her feel bad for about an hour after she takes it and then as tolerated  Was unable to take the carvedilol stated that within 30 minutes of taking it her leg swelled up.  Is concerned about her balance being off at times and memory problems for the last few months does not remember an event.  Wonders if an MRI would be helpful No neurologic numbness in her feet but does get tingling in her hands although keyboards all day.  Dill not under adequate treatment for sleep apnea for many reasons.  Sleep apnea not being treated for a number of reasons. ROS: See pertinent positives and negatives per HPI.  Past Medical History:  Diagnosis Date   Allergic rhinitis    Diabetes mellitus    Hyperlipidemia    Hypertension    x many years (since birht of her son at 43) Had workup with renal artery angiogram that showed no evidence for fibromuscular dysplasia ( no significant renal artery stenosis.) ECHO (8/11) EF 60-65%, mild LVH, no regional WMAs.   Low back pain    Morbid obesity (HCC)    OSA (obstructive sleep apnea) 12/28/2015    Family History  Problem Relation Age of Onset   Cancer  Mother    Hypertension Mother    Other Father        died in MVA    Social History   Socioeconomic History   Marital status: Married    Spouse name: Not on file   Number of children: Not on file   Years of education: Not on file   Highest education level: Not on file  Occupational History   Not on file  Tobacco Use   Smoking status: Never   Smokeless tobacco: Never  Vaping Use   Vaping Use: Never used  Substance and Sexual Activity   Alcohol use: No    Alcohol/week: 0.0 standard drinks   Drug use: No   Sexual activity: Not on file  Other Topics Concern   Not on file  Social History Narrative   Married   Regular exercise-yes   HH of 4    28 and 22    No pets   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 Strain: Not on file  Food Insecurity: Not on file  Transportation Needs: Not on file  Physical Activity: Not on file  Stress: Not on file  Social Connections: Not on file    Outpatient Medications Prior to Visit  Medication Sig Dispense Refill   albuterol (  VENTOLIN HFA) 108 (90 Base) MCG/ACT inhaler INHALE 1-2 PUFFS INTO THE LUNGS 2 (TWO) TIMES DAILY AS NEEDED FOR WHEEZING OR SHORTNESS OF BREATH. 6.7 each 0   Ascorbic Acid (VITAMIN C PO) Take 1 tablet by mouth at bedtime.     Azelastine-Fluticasone (DYMISTA) 137-50 MCG/ACT SUSP Place 2 sprays into the nose daily. 23 g 0   BREO ELLIPTA 200-25 MCG/INH AEPB TAKE 1 PUFF BY MOUTH EVERY DAY 60 each 5   calcium carbonate (OSCAL) 1500 (600 Ca) MG TABS tablet Take 600 mg of elemental calcium by mouth daily.     glucose blood (ACCU-CHEK GUIDE) test strip Use as instructed to check sugar 3 times daily 300 each 1   Lancets Misc. (ACCU-CHEK MULTICLIX LANCET DEV) KIT Use as instructed to check sugar 3 times daily 1 each prn   Magnesium 250 MG TABS Take 1 tablet by mouth daily.     Multiple Vitamin (MULTIVITAMIN WITH MINERALS) TABS tablet Take 1 tablet by mouth at bedtime. One a Day 50  plus     MULTIPLE VITAMIN PO Take by mouth daily.     ONE TOUCH LANCETS MISC Use to check 3 times daily 300 each 5   pantoprazole (PROTONIX) 40 MG tablet TAKE 1 TABLET BY MOUTH EVERY DAY 90 tablet 0   potassium chloride SA (KLOR-CON M20) 20 MEQ tablet Take 1 tablet (20 mEq total) by mouth daily. 90 tablet 0   olmesartan (BENICAR) 20 MG tablet Take 1 tablet (20 mg total) by mouth daily. 90 tablet 0   carvedilol (COREG) 12.5 MG tablet Take 0.5 tablets (6.25 mg total) by mouth 2 (two) times daily with a meal. After 2 weeks increase to 12.5 mg twice a day for hypertension control . 60 tablet 3   FARXIGA 10 MG TABS tablet TAKE 1 TABLET BY MOUTH EVERY DAY BEFORE BREAKFAST 30 tablet 1   No facility-administered medications prior to visit.     EXAM:  BP (!) 180/90 (BP Location: Left Arm, Patient Position: Sitting, Cuff Size: Large)   Pulse 82   Temp 97.7 F (36.5 C) (Oral)   Ht '5\' 7"'  (1.702 m)   Wt 299 lb 12.8 oz (136 kg)   SpO2 97%   BMI 46.96 kg/m   Body mass index is 46.96 kg/m. Repeat blood pressure was 178/80 right arm large sitting although cuff may have been small for her arm GENERAL: vitals reviewed and listed above, alert, oriented, appears well hydrated and in no acute distress HEENT: atraumatic, conjunctiva  clear, no obvious abnormalities on inspection of external nose and ears OP : masked  NECK: no obvious masses on inspection palpation  LUNGS: clear to auscultation bilaterally, no wheezes, rales or rhonchi, good air movement CV: HRRR, no clubbing cyanosis or has puffy ankles no major change nl cap refill  MS: moves all extremities without noticeable focal  abnormality gait appears steady but feels unsteady when turning moving there is no tremor or cogwheeling.  Formal memory testing not done today PSYCH: pleasant and cooperative, no obvious depression or anxiety Lab Results  Component Value Date   WBC 6.6 01/27/2020   HGB 13.9 01/27/2020   HCT 41.5 01/27/2020   PLT 367  01/27/2020   GLUCOSE 160 (H) 11/14/2020   CHOL 181 01/27/2020   TRIG 132 01/27/2020   HDL 49 (L) 01/27/2020   LDLCALC 107 (H) 01/27/2020   ALT 19 01/27/2020   AST 15 01/27/2020   NA 139 11/14/2020   K 3.5 11/14/2020  CL 97 11/14/2020   CREATININE 0.90 11/14/2020   BUN 22 11/14/2020   CO2 35 (H) 11/14/2020   TSH 1.71 01/27/2020   INR 1.0 RATIO 07/08/2006   HGBA1C 9.0 (A) 03/28/2021   MICROALBUR 2.4 (H) 05/25/2018   BP Readings from Last 3 Encounters:  03/28/21 (!) 180/90  01/18/21 (!) 184/96  11/14/20 (!) 170/98   Wt Readings from Last 3 Encounters:  03/28/21 299 lb 12.8 oz (136 kg)  01/18/21 295 lb 3.2 oz (133.9 kg)  11/14/20 (!) 302 lb (137 kg)    ASSESSMENT AND PLAN:  Discussed the following assessment and plan:  Complaints of memory disturbance - Plan: MR Brain W Wo Contrast, Ambulatory referral to Neurology, Basic metabolic panel, Vitamin E33, TSH, T4, free, CBC with Differential/Platelet  Balance problem - Plan: MR Brain W Wo Contrast, Ambulatory referral to Neurology, Basic metabolic panel, Vitamin I95, TSH, T4, free, CBC with Differential/Platelet  Essential hypertension - Plan: Basic metabolic panel, Vitamin J88, TSH, T4, free, CBC with Differential/Platelet  Uncontrolled type 2 diabetes mellitus with hyperglycemia (HCC) - Plan: POCT glycosylated hemoglobin (Hb A1C)  Medication management - Plan: Basic metabolic panel, Vitamin C16, TSH, T4, free, CBC with Differential/Platelet  Side effect of medication She has been having side effects with many medicines although willing to try certain ones again At this point would go up on the olmesartan to 40 mg although do not expect it to be fully successful would probably be the most tolerated see below  Will consider spironolactone again not sure why was not continued or tolerated at that time. Plan MRI of brain may need repeat chemistry for testing Neurology consult discussed blood pressure and diabetes control very  important for brain health and neurovascular health.  She is aware. Has cardiology appointment in January need help with getting her on the right medications that she can tolerate and control her situation.  A1c one-point better than last although in office tends to run a little lower this is hopeful if she gets back on the [place].  Declining injectables in the past readdress at the follow-up to be seeing endocrinologist but that fell through also. Another record review planned  -Patient advised to return or notify health care team  if  new concerns arise. 50 minutes review plan visit counsel  Patient Instructions  Repeat BP was 178/80 r arm . Want the BP lower  considering   other meds   since so many caused side effects .   Still consider Bp clininc.  Increase  olmesartan to 40 mg per day .  ( 2 x 20)  and  let us know next week if all ok and we can send in olmesartan 40 mg . To take daily    Other meds hydralazine  consider trying spironolactone again    Go back on  farxiga   I dont think causing the  symptoms .of concern  Will or der MRI and referral to neurology about the memory lapses and some balance issues .  May need another  lab tests  Get copy of labs done at your gyne   need creatinine   ( chem panel )   for kidney function.   Plan ROV or fu     virtual  ok about BP .   And then after  neurology consult.     Standley Brooking. Tricia Pledger M.D. 2015 2014 maybe diarrhea and fatigue with spironolactone   dr Cruzita Lederer

## 2021-03-28 NOTE — Telephone Encounter (Signed)
Sure order signed

## 2021-04-15 ENCOUNTER — Other Ambulatory Visit: Payer: Self-pay | Admitting: Internal Medicine

## 2021-04-25 ENCOUNTER — Telehealth: Payer: Self-pay | Admitting: Internal Medicine

## 2021-04-25 NOTE — Telephone Encounter (Signed)
Pt is calling to let dr Fabian Sharp know the faxiga did cause her yeast infection and she would like a different dm medication to be sent to  [id number] - [place], [place] - [address] AT [facility] [address] Phone:  [phone]  Fax:  [phone]

## 2021-04-27 ENCOUNTER — Other Ambulatory Visit: Payer: Self-pay | Admitting: Internal Medicine

## 2021-04-27 NOTE — Telephone Encounter (Signed)
Diabetes  thastis not controlled with elevated blood sugar  can also cause yeast infection.  So may be the medicine or may be the diabetes.   Assuming from message that she is treating the yeast infection with otc monistat or similar meds.   Make appt to discuss options.  Such as ozempic and or going back on insulin etc

## 2021-05-02 ENCOUNTER — Encounter (HOSPITAL_BASED_OUTPATIENT_CLINIC_OR_DEPARTMENT_OTHER): Payer: Self-pay

## 2021-05-03 NOTE — Telephone Encounter (Signed)
Called patient to inform patient of message pt has taken Otc medication for yeast infection pt also stated that she has an appt with an Cardiologist and will discuss B/P medication also pt is waiting to hear back from a  Endocrinologists who is accepting new patients.

## 2021-05-04 ENCOUNTER — Other Ambulatory Visit: Payer: Self-pay | Admitting: Internal Medicine

## 2021-05-14 NOTE — Progress Notes (Signed)
Advanced Hypertension Clinic Initial Assessment:    Date:  05/15/2021   ID:  [name], DOB [birth year], MRN [id number]  PCP:  Burnis Medin, MD  Cardiologist:  None  Nephrologist:  Referring MD: Burnis Medin, MD   CC: Hypertension  History of Present Illness:    [name] is a 61 y.o. female with a hx of hypertension, hyperlipidemia, diabetes mellitus, OSA, and obesity here to establish care in the Advanced Hypertension Clinic. She tried spiranolactone before. She's had problems with medications causing her to swell and increase in urinary frequency.   She saw Dr. Regis Bill 01/2021 for diabetes and hypertension. Her blood pressure was not well-controlled at this visit. She tried olmesartan 40 mg but it was decreased because it caused her indescribable pain. She tried the olmesartan with and without Wilder Glade but continued to feel pain. Carvedilol was started and she was referred to the hypertension clinic for further help. She saw Dr. Regis Bill again 03/2021. She had self-discontinued Wilder Glade because she thought it was affecting to her memory issues. Carvedilol was also discontinued because it caused her legs to swell. She was willing to try certain previous medications again and olmesartan was increased to 40 mg daily as it was considered to be the most tolerated.   Today, she reports having had high blood pressure for 25 years which has always been hard to control. She is sensitive to medication side effects such as fatigue, nausea, and LE swelling. She noticed memory issues on olmesartan which she has been told may be related to stroke. She staggers while walking and has trouble recalling the correct words during conversation. The memory issues, staggering, and slurred speech has caused her distress. However, she continues to take olmesartan. She tried HCTZ but it dropped her potassium. She also tried spironolactone but does not remember her adverse reaction. She is open to  retrying previous medications. She is not currently on diabetes medication because she had diarrhea and yeast infections on Farxiga. For exercise, she enjoys walking around her neighborhood. She is hesitant to go to the gym because of how COVID may affect her respiratory illnesses. For diet, she enjoys chicken and salads, avoids sodas and added sugar, and makes food at home. She watches the added salt in her food. She occasionally drinks tea and coffee with minimal sugar and milk and does not drink alcohol. For pain, she opts for Tylenol. She does not take OTC cough medications but does takes vitamin C, collagen, and protein supplements and metamucil. Her mother and maternal grandmother both had high blood pressure. Her maternal grandmother also had a stroke. She does not use her CPAP because it was uncomfortable and her insurance stopped paying for it. She checks her blood pressure at home but feels her reading are incorrect because her cuff is too small. Of note, she is stressed at her job. She denies any palpitations, chest pain, shortness of breath, syncope, orthopnea, PND, or exertional symptoms.  Previous antihypertensives: Carvedilol - LE edema Olmesartan - pain Candesartan/ARBs  Past Medical History:  Diagnosis Date   Allergic rhinitis    Diabetes mellitus    Hyperlipidemia    Hypertension    x many years (since birht of her son at 68) Had workup with renal artery angiogram that showed no evidence for fibromuscular dysplasia ( no significant renal artery stenosis.) ECHO (8/11) EF 60-65%, mild LVH, no regional WMAs.   Low back pain    Morbid obesity (HCC)    OSA (  obstructive sleep apnea) 12/28/2015    Past Surgical History:  Procedure Laterality Date   ABDOMINAL HYSTERECTOMY     partial for fibroids   TUBAL LIGATION      Current Medications: Current Meds  Medication Sig   albuterol (VENTOLIN HFA) 108 (90 Base) MCG/ACT inhaler INHALE 1-2 PUFFS INTO THE LUNGS 2 (TWO) TIMES DAILY AS  NEEDED FOR WHEEZING OR SHORTNESS OF BREATH.   Ascorbic Acid (VITAMIN C PO) Take 1 tablet by mouth at bedtime.   Azelastine-Fluticasone (DYMISTA) 137-50 MCG/ACT SUSP Place 2 sprays into the nose daily.   BREO ELLIPTA 200-25 MCG/INH AEPB TAKE 1 PUFF BY MOUTH EVERY DAY   calcium carbonate (OSCAL) 1500 (600 Ca) MG TABS tablet Take 600 mg of elemental calcium by mouth daily.   glucose blood (ACCU-CHEK GUIDE) test strip Use as instructed to check sugar 3 times daily   KLOR-CON M20 20 MEQ tablet TAKE 1 TABLET BY MOUTH EVERY DAY   Lancets Misc. (ACCU-CHEK MULTICLIX LANCET DEV) KIT Use as instructed to check sugar 3 times daily   Magnesium 250 MG TABS Take 1 tablet by mouth daily.   Multiple Vitamin (MULTIVITAMIN WITH MINERALS) TABS tablet Take 1 tablet by mouth at bedtime. One a Day 50 plus   MULTIPLE VITAMIN PO Take by mouth daily.   olmesartan (BENICAR) 20 MG tablet TAKE 1 TABLET BY MOUTH EVERY DAY   ONE TOUCH LANCETS MISC Use to check 3 times daily   spironolactone (ALDACTONE) 25 MG tablet Take 1 tablet (25 mg total) by mouth daily.     Allergies:   Metformin and related, Olmesartan, Amlodipine, Candesartan cilexetil, Codeine, Farxiga [dapagliflozin], Hydrochlorothiazide, Ibuprofen, Telmisartan, and Zithromax [azithromycin]   Social History   Socioeconomic History   Marital status: Married    Spouse name: Not on file   Number of children: Not on file   Years of education: Not on file   Highest education level: Not on file  Occupational History   Not on file  Tobacco Use   Smoking status: Never   Smokeless tobacco: Never  Vaping Use   Vaping Use: Never used  Substance and Sexual Activity   Alcohol use: No    Alcohol/week: 0.0 standard drinks   Drug use: No   Sexual activity: Not on file  Other Topics Concern   Not on file  Social History Narrative   Married   Regular exercise-yes   HH of 4    28 and 24    No pets   Works at [employer] of  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    Difficulty of Paying Living Expenses: Not hard at all  Food Insecurity: No Food Insecurity   Worried About Charity fundraiser in the Last Year: Never true   Arboriculturist in the Last Year: Never true  Transportation Needs: No Transportation Needs   Lack of Transportation (Medical): No   Lack of Transportation (Non-Medical): No  Physical Activity: Inactive   Days of Exercise per Week: 0 days   Minutes of Exercise per Session: 0 min  Stress: Not on file  Social Connections: Not on file     Family History: The patient's family history includes Cancer in her mother; Hypertension in her maternal grandmother and mother; Other in her father; Stroke in her maternal grandmother.  ROS:   Please see the history of present illness.    (+) Memory issues (+) Confusion (+) Staggering (+)  Stress (+) LE edema All other systems reviewed and are negative.  EKGs/Labs/Other Studies Reviewed:    Echo 07/31/20 -Left ventricle: The cavity size was normal. Wall thickness was    increased in a pattern of mild LVH. Systolic function was normal.    The estimated ejection fraction was in the range of 55% to 60%.   EKG:   05/15/21: Sinus rhythm, rate 84 bpm; PAC  Recent Labs: 11/14/2020: BUN 22; Creatinine, Ser 0.90; Potassium 3.5; Sodium 139   Recent Lipid Panel    Component Value Date/Time   CHOL 181 01/27/2020 0826   TRIG 132 01/27/2020 0826   HDL 49 (L) 01/27/2020 0826   CHOLHDL 3.7 01/27/2020 0826   VLDL 28.6 05/25/2018 1019   LDLCALC 107 (H) 01/27/2020 0826    Physical Exam:   VS:  BP (!) 196/92 (BP Location: Left Arm, Patient Position: Sitting, Cuff Size: Large)    Pulse 84    Ht _0  (1.702 m)    Wt (!) 301 lb 6.4 oz (136.7 kg)    BMI 47.21 kg/m  , BMI Body mass index is 47.21 kg/m. GENERAL:  Well appearing HEENT: Pupils equal round and reactive, fundi not visualized, oral mucosa unremarkable NECK:  No jugular venous distention,  waveform within normal limits, carotid upstroke brisk and symmetric, no bruits, no thyromegaly LYMPHATICS:  No cervical adenopathy LUNGS:  Clear to auscultation bilaterally HEART:  RRR.  PMI not displaced or sustained,S1 and S2 within normal limits, no S3, no S4, no clicks, no rubs, no murmurs ABD:  Flat, positive bowel sounds normal in frequency in pitch, no bruits, no rebound, no guarding, no midline pulsatile mass, no hepatomegaly, no splenomegaly EXT:  2 plus pulses throughout, mild ankle edema, no cyanosis no clubbing SKIN:  No rashes no nodules NEURO:  Cranial nerves II through XII grossly intact, motor grossly intact throughout PSYCH:  Cognitively intact, oriented to person place and time  ASSESSMENT/PLAN:    Essential hypertension Blood pressure poorly controlled at home and in the office.  Actually increased on repeat.  We identified several factors that are likely contributing.  She has untreated OSA and is not ready to get any sleep study at this time.  She also has a lot of stress at work.  She is very physically inactive.  We will refer her to the PR EP program to the [facility].  Recommend increasing exercise to at least 150 minutes weekly.  She is also struggled with intolerance to many medications.  We will try spironolactone 25 mg daily.  Check a CMP in 1 week.  She will also get fasting lipids at that time.  We will check her cortisol as well as a TSH.  She is very motivated to work on weight loss and exercise.  OSA (obstructive sleep apnea) We discussed how sleep apnea affects elevated blood pressure and pulmonary hypertension over time.  She expressed understanding.  It will cost her $3000 to have a repeat sleep study and she did not tolerate her mask before.  She is going to think about it.  Pure hypercholesterolemia She will work on increasing her exercise.  She already has a healthy diet.  Referral to PREP program.   Screening for Secondary Hypertension:  Causes 05/15/2021   Drugs/Herbals Screened     - Comments caffeine in coffee/tea, no EtOH  Renovascular HTN N/A  Sleep Apnea Screened     - Comments untreated OSA.  Hasn't tolerated CPAP  Thyroid Disease Screened     -  Comments Repeat TSH  Hyperaldosteronism N/A  Pheochromocytoma N/A  Cushing's Syndrome Screened     - Comments cortisol  Hyperparathyroidism N/A  Coarctation of the Aorta Screened     - Comments Blood pressure symmetric  Compliance Screened    Relevant Labs/Studies: Basic Labs Latest Ref Rng & Units 11/14/2020 01/27/2020 03/16/2019  Sodium 135 - 145 mEq/L 139 139 134(L)  Potassium 3.5 - 5.1 mEq/L 3.5 3.6 3.4(L)  Creatinine 0.40 - 1.20 mg/dL 0.90 1.01 0.96    Thyroid  Latest Ref Rng & Units 01/27/2020 03/16/2019  TSH 0.40 - 4.50 mIU/L 1.71 1.26    Renin/Aldosterone  Latest Ref Rng & Units 02/11/2013 02/25/2011 01/15/2011  Aldosterone ng/dL _0 Cortisol Latest Ref Rng & Units 02/25/2011 01/15/2011  Cortisol  ug/dL 6.6 6.5         she does not consent to be monitored in our remote patient monitoring program through [place] but is interested.  she will track his blood pressure twice daily and understands that these trends will help [place] to adjust her medications as needed prior to his next appointment.  she is interested in enrolling in the PREP exercise and nutrition program through the [facility].    Disposition:    FU with Keshona Kartes C. Oval Linsey, MD, [facility] in 4 months FU with APP in 1 months  Medication Adjustments/Labs and Tests Ordered: Current medicines are reviewed at length with the patient today.  Concerns regarding medicines are outlined above.  Orders Placed This Encounter  Procedures   CBC with Differential/Platelet   T4, free   TSH   Cortisol   Lipid panel   Comprehensive metabolic panel   Y65   Amb Referral To Provider Referral Exercise Program (P.R.E.P)   EKG 12-Lead   Meds ordered this encounter  Medications   spironolactone (ALDACTONE) 25 MG tablet    Sig:  Take 1 tablet (25 mg total) by mouth daily.    Dispense:  90 tablet    Refill:  3   I,Mykaella Javier,acting as a scribe for Skeet Latch, MD.,have documented all relevant documentation on the behalf of Skeet Latch, MD,as directed by  Skeet Latch, MD while in the presence of Skeet Latch, MD.  I, [place] Oval Linsey, MD have reviewed all documentation for this visit.  The documentation of the exam, diagnosis, procedures, and orders on 05/15/2021 are all accurate and complete.   Signed, Skeet Latch, MD  05/15/2021 2:39 PM    [place]

## 2021-05-15 ENCOUNTER — Other Ambulatory Visit: Payer: Self-pay

## 2021-05-15 ENCOUNTER — Telehealth (HOSPITAL_BASED_OUTPATIENT_CLINIC_OR_DEPARTMENT_OTHER): Payer: Self-pay

## 2021-05-15 ENCOUNTER — Ambulatory Visit (HOSPITAL_BASED_OUTPATIENT_CLINIC_OR_DEPARTMENT_OTHER): Payer: No Typology Code available for payment source | Admitting: Cardiovascular Disease

## 2021-05-15 ENCOUNTER — Encounter (HOSPITAL_BASED_OUTPATIENT_CLINIC_OR_DEPARTMENT_OTHER): Payer: Self-pay | Admitting: Cardiovascular Disease

## 2021-05-15 DIAGNOSIS — Z0000 Encounter for general adult medical examination without abnormal findings: Secondary | ICD-10-CM

## 2021-05-15 DIAGNOSIS — G4733 Obstructive sleep apnea (adult) (pediatric): Secondary | ICD-10-CM

## 2021-05-15 DIAGNOSIS — I10 Essential (primary) hypertension: Secondary | ICD-10-CM

## 2021-05-15 MED ORDER — SPIRONOLACTONE 25 MG PO TABS: 25.0000 mg | ORAL_TABLET | Freq: Every day | ORAL | 3 refills | Status: DC

## 2021-05-15 NOTE — Patient Instructions (Signed)
Medication Instructions:  START SPIRONOLACTONE 25 MG DAILY    Labwork: FASTING LP/CMET/B12/TSH/FT4/CORTISOL/CBC 1 WEEK    Testing/Procedures: NONE   Follow-Up: 06/26/2021 8 AM WITH PHARM D AT [facility] OFFICE    You will receive a phone call from the PREP exercise and nutrition program to schedule an initial assessment.  Special Instructions:   MONITOR YOUR BLOOD PRESSURE TWICE A DAY, LOG IN THE BOOK PROVIDED. BRING THE BOOK AND YOUR BLOOD PRESSURE MACHINE TO YOUR FOLLOW UP IN 1 MONTH   AMY OUR CARE GUIDE WILL REACH OUT TO YOU FOR STRESS MANAGEMENT   DASH Eating Plan DASH stands for "Dietary Approaches to Stop Hypertension." The DASH eating plan is a healthy eating plan that has been shown to reduce high blood pressure (hypertension). It may also reduce your risk for type 2 diabetes, heart disease, and stroke. The DASH eating plan may also help with weight loss. What are tips for following this plan?  General guidelines Avoid eating more than 2,300 mg (milligrams) of salt (sodium) a day. If you have hypertension, you may need to reduce your sodium intake to 1,500 mg a day. Limit alcohol intake to no more than 1 drink a day for nonpregnant women and 2 drinks a day for men. One drink equals 12 oz of beer, 5 oz of wine, or 1 oz of hard liquor. Work with your health care provider to maintain a healthy body weight or to lose weight. Ask what an ideal weight is for you. Get at least 30 minutes of exercise that causes your heart to beat faster (aerobic exercise) most days of the week. Activities may include walking, swimming, or biking. Work with your health care provider or diet and nutrition specialist (dietitian) to adjust your eating plan to your individual calorie needs. Reading food labels  Check food labels for the amount of sodium per serving. Choose foods with less than 5 percent of the Daily Value of sodium. Generally, foods with less than 300 mg of sodium per serving fit into this  eating plan. To find whole grains, look for the word "whole" as the first word in the ingredient list. Shopping Buy products labeled as "low-sodium" or "no salt added." Buy fresh foods. Avoid canned foods and premade or frozen meals. Cooking Avoid adding salt when cooking. Use salt-free seasonings or herbs instead of table salt or sea salt. Check with your health care provider or pharmacist before using salt substitutes. Do not fry foods. Cook foods using healthy methods such as baking, boiling, grilling, and broiling instead. Cook with heart-healthy oils, such as olive, canola, soybean, or sunflower oil. Meal planning Eat a balanced diet that includes: 5 or more servings of fruits and vegetables each day. At each meal, try to fill half of your plate with fruits and vegetables. Up to 6-8 servings of whole grains each day. Less than 6 oz of lean meat, poultry, or fish each day. A 3-oz serving of meat is about the same size as a deck of cards. One egg equals 1 oz. 2 servings of low-fat dairy each day. A serving of nuts, seeds, or beans 5 times each week. Heart-healthy fats. Healthy fats called Omega-3 fatty acids are found in foods such as flaxseeds and coldwater fish, like sardines, salmon, and mackerel. Limit how much you eat of the following: Canned or prepackaged foods. Food that is high in trans fat, such as fried foods. Food that is high in saturated fat, such as fatty meat. Sweets, desserts, sugary drinks,  and other foods with added sugar. Full-fat dairy products. Do not salt foods before eating. Try to eat at least 2 vegetarian meals each week. Eat more home-cooked food and less restaurant, buffet, and fast food. When eating at a restaurant, ask that your food be prepared with less salt or no salt, if possible. What foods are recommended? The items listed may not be a complete list. Talk with your dietitian about what dietary choices are best for you. Grains Whole-grain or  whole-wheat bread. Whole-grain or whole-wheat pasta. Brown rice. [place]row. Bulgur. Whole-grain and low-sodium cereals. Pita bread. Low-fat, low-sodium crackers. Whole-wheat flour tortillas. Vegetables Fresh or frozen vegetables (raw, steamed, roasted, or grilled). Low-sodium or reduced-sodium tomato and vegetable juice. Low-sodium or reduced-sodium tomato sauce and tomato paste. Low-sodium or reduced-sodium canned vegetables. Fruits All fresh, dried, or frozen fruit. Canned fruit in natural juice (without added sugar). Meat and other protein foods Skinless chicken or [place]. Ground chicken or [place]. Pork with fat trimmed off. Fish and seafood. Egg whites. Dried beans, peas, or lentils. Unsalted nuts, nut butters, and seeds. Unsalted canned beans. Lean cuts of beef with fat trimmed off. Low-sodium, lean deli meat. Dairy Low-fat (1%) or fat-free (skim) milk. Fat-free, low-fat, or reduced-fat cheeses. Nonfat, low-sodium ricotta or cottage cheese. Low-fat or nonfat yogurt. Low-fat, low-sodium cheese. Fats and oils Soft margarine without trans fats. Vegetable oil. Low-fat, reduced-fat, or light mayonnaise and salad dressings (reduced-sodium). Canola, safflower, olive, soybean, and sunflower oils. Avocado. Seasoning and other foods Herbs. Spices. Seasoning mixes without salt. Unsalted popcorn and pretzels. Fat-free sweets. What foods are not recommended? The items listed may not be a complete list. Talk with your dietitian about what dietary choices are best for you. Grains Baked goods made with fat, such as croissants, muffins, or some breads. Dry pasta or rice meal packs. Vegetables Creamed or fried vegetables. Vegetables in a cheese sauce. Regular canned vegetables (not low-sodium or reduced-sodium). Regular canned tomato sauce and paste (not low-sodium or reduced-sodium). Regular tomato and vegetable juice (not low-sodium or reduced-sodium). Angie Fava. Olives. Fruits Canned fruit in a light  or heavy syrup. Fried fruit. Fruit in cream or butter sauce. Meat and other protein foods Fatty cuts of meat. Ribs. Fried meat. Berniece Salines. Sausage. Bologna and other processed lunch meats. Salami. Fatback. Hotdogs. Bratwurst. Salted nuts and seeds. Canned beans with added salt. Canned or smoked fish. Whole eggs or egg yolks. Chicken or [place] with skin. Dairy Whole or 2% milk, cream, and half-and-half. Whole or full-fat cream cheese. Whole-fat or sweetened yogurt. Full-fat cheese. Nondairy creamers. Whipped toppings. Processed cheese and cheese spreads. Fats and oils Butter. Stick margarine. Lard. Shortening. Ghee. Bacon fat. Tropical oils, such as coconut, palm kernel, or palm oil. Seasoning and other foods Salted popcorn and pretzels. Onion salt, garlic salt, seasoned salt, table salt, and sea salt. Worcestershire sauce. Tartar sauce. Barbecue sauce. Teriyaki sauce. Soy sauce, including reduced-sodium. Steak sauce. Canned and packaged gravies. Fish sauce. Oyster sauce. Cocktail sauce. Horseradish that you find on the shelf. Ketchup. Mustard. Meat flavorings and tenderizers. Bouillon cubes. Hot sauce and Tabasco sauce. Premade or packaged marinades. Premade or packaged taco seasonings. Relishes. Regular salad dressings. Where to find more information: National Heart, Lung, and [place]: https://wilson-eaton.com/ American Heart Association: www.heart.org Summary The DASH eating plan is a healthy eating plan that has been shown to reduce high blood pressure (hypertension). It may also reduce your risk for type 2 diabetes, heart disease, and stroke. With the DASH eating plan, you should limit  salt (sodium) intake to 2,300 mg a day. If you have hypertension, you may need to reduce your sodium intake to 1,500 mg a day. When on the DASH eating plan, aim to eat more fresh fruits and vegetables, whole grains, lean proteins, low-fat dairy, and heart-healthy fats. Work with your health care provider or diet and  nutrition specialist (dietitian) to adjust your eating plan to your individual calorie needs. This information is not intended to replace advice given to you by your health care provider. Make sure you discuss any questions you have with your health care provider. Document Released: 04/11/2011 Document Revised: 04/04/2017 Document Reviewed: 04/15/2016 Elsevier Patient Education  2020 [employer].

## 2021-05-15 NOTE — Assessment & Plan Note (Addendum)
Blood pressure poorly controlled at home and in the office.  Actually increased on repeat.  We identified several factors that are likely contributing.  She has untreated OSA and is not ready to get any sleep study at this time.  She also has a lot of stress at work.  She is very physically inactive.  We will refer her to the PR EP program to the [facility].  Recommend increasing exercise to at least 150 minutes weekly.  She is also struggled with intolerance to many medications.  We will try spironolactone 25 mg daily.  Check a CMP in 1 week.  She will also get fasting lipids at that time.  We will check her cortisol as well as a TSH.  She is very motivated to work on weight loss and exercise.

## 2021-05-15 NOTE — Assessment & Plan Note (Signed)
She will work on increasing her exercise.  She already has a healthy diet.  Referral to PREP program.

## 2021-05-15 NOTE — Assessment & Plan Note (Signed)
We discussed how sleep apnea affects elevated blood pressure and pulmonary hypertension over time.  She expressed understanding.  It will cost her $3000 to have a repeat sleep study and she did not tolerate her mask before.  She is going to think about it.

## 2021-05-15 NOTE — Telephone Encounter (Signed)
Called the patient to discuss health coaching for stress management per referral from Dr. Duke Salvia. Patient did not answer. Left a message for patient to return call to discuss health coaching in detail. If patient has not reached by 1/12, patient will be called again on 1/13 for follow-up.   Travion Ke Nedra Hai, [facility] Guide, Health Coach [address], [address] [place] [address] Telephone: [phone] Email: [email or web address]

## 2021-05-16 ENCOUNTER — Telehealth: Payer: Self-pay

## 2021-05-16 NOTE — Telephone Encounter (Signed)
Called to discuss PREP program, left voicemail ? ?

## 2021-05-17 ENCOUNTER — Telehealth: Payer: Self-pay

## 2021-05-17 NOTE — Telephone Encounter (Signed)
Called to discuss PREP program, she works full time from home, so day time classes are not an option, even though she prefers to come to [employer]; will have [place] contact her about next evening class at [place].

## 2021-05-21 ENCOUNTER — Ambulatory Visit (INDEPENDENT_AMBULATORY_CARE_PROVIDER_SITE_OTHER): Payer: No Typology Code available for payment source

## 2021-05-21 ENCOUNTER — Telehealth: Payer: Self-pay

## 2021-05-21 ENCOUNTER — Other Ambulatory Visit: Payer: Self-pay

## 2021-05-21 DIAGNOSIS — Z0000 Encounter for general adult medical examination without abnormal findings: Secondary | ICD-10-CM

## 2021-05-21 NOTE — Telephone Encounter (Signed)
Returned my call re: PREP, she wants more information, answered her questions, probably needs [place] evenings; will have Pam, [facility] contact here about next class starting in February.

## 2021-05-21 NOTE — Progress Notes (Signed)
Appointment Outcome: Completed, Session #: Initial health coaching session Start time: 1:34pm   End time: 2:34pm   Total Mins: 60 minutes  AGREEMENTS SECTION   Overall Goal(s): Stress management                                             Agreement/Action Steps:  Exercise at [facility] 5 days/week from 6:30pm-7:30pm Walk on Saturdays and Sundays [employer] or in neighborhood Journal for 5 minutes during lunch break Implement down time before bed Read scriptures Implement positive self-talk/scripture affirmations Start day with positive self-talk/scripture affirmations Utilize support system  Progress Notes:  Patient stated that time management is a concern for her to be able to implement exercise into her routine to manage stress and to address her concerns with diabetes and hypertension. 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   Patient stated that her job is source of stress that she is working through daily. Patient shared her daily routine where she gets up at 7am and go to bed between 12am-79m. Patient stated that she does cook and have leftovers sometimes for the next day, which helps with having lunch for her husband at work. Patient mentioned that her husband is her biggest supporter.   Patient reported that she can relax during her lunch break. Patient mentioned that there have been times walking during her lunch time. Patient expressed that she enjoys walking outside compared to her walking on a treadmill. Patient stated that she has tried getting out on the deck to get some fresh air as well.    Coaching Outcomes: Patient has a membership at [employer] that she will resume using to walk and incorporate weights. Patient will engage in these physical activities 5 days a week from 6:30pm-7:30pm. On Saturday/Sunday, the patient will take walks at the [facility] or in her neighborhood.   Patient will start writing for approximately 5 minutes  during her lunch break and reflect on growth periodically in how she is managing her stress.   Patient will continue to talk with her husband to conduct self-check-ins and think through stressful situations to get his positive guidance.  Patient will continue to read the Bible before bed as part of her me time, and will start the day with an affirming scripture, while implementing positive self-talk/scripture affirmations throughout the day.   Patient was sent information on deep breathing that she can consider implementing throughout her workday in conjunction with positive self-talk to manage work stress. Patient will consider taking breaks, when possible, as well.  Sent email to [facility] regarding patient's interest in PREP to determine if she will have some evening classes available soon.

## 2021-05-21 NOTE — Telephone Encounter (Signed)
Patient called in to inquire about health coaching regarding stress management. Patient is interested in health coaching and has been scheduled for initial health coaching session at 1:30pm today. Patient will be called at that time.   Jacqlyn Marolf Nedra Hai, [facility] Guide, Health Coach [address], [address] [place] [address] Telephone: [phone] Email: [email or web address]

## 2021-06-06 ENCOUNTER — Telehealth: Payer: Self-pay | Admitting: Cardiovascular Disease

## 2021-06-06 ENCOUNTER — Ambulatory Visit: Payer: No Typology Code available for payment source | Admitting: Family Medicine

## 2021-06-06 VITALS — BP 140/80 | HR 93 | Temp 98.3°F | Wt 303.6 lb

## 2021-06-06 DIAGNOSIS — R109 Unspecified abdominal pain: Secondary | ICD-10-CM | POA: Diagnosis not present

## 2021-06-06 DIAGNOSIS — M546 Pain in thoracic spine: Secondary | ICD-10-CM | POA: Diagnosis not present

## 2021-06-06 LAB — COMPREHENSIVE METABOLIC PANEL
ALT: 22 IU/L (ref 0–32)
AST: 16 IU/L (ref 0–40)
Albumin/Globulin Ratio: 1.3 (ref 1.2–2.2)
Albumin: 4.1 g/dL (ref 3.8–4.9)
Alkaline Phosphatase: 98 IU/L (ref 44–121)
BUN/Creatinine Ratio: 12 (ref 9–23)
BUN: 12 mg/dL (ref 6–24)
Bilirubin Total: 0.4 mg/dL (ref 0.0–1.2)
CO2: 26 mmol/L (ref 20–29)
Calcium: 9.1 mg/dL (ref 8.7–10.2)
Chloride: 99 mmol/L (ref 96–106)
Creatinine, Ser: 1 mg/dL (ref 0.57–1.00)
Globulin, Total: 3.2 g/dL (ref 1.5–4.5)
Glucose: 178 mg/dL — ABNORMAL HIGH (ref 70–99)
Potassium: 4.1 mmol/L (ref 3.5–5.2)
Sodium: 141 mmol/L (ref 134–144)
Total Protein: 7.3 g/dL (ref 6.0–8.5)
eGFR: 65 mL/min/{1.73_m2} (ref >59)

## 2021-06-06 LAB — CBC WITH DIFFERENTIAL/PLATELET
Basophils Absolute: 0.1 10*3/uL (ref 0.0–0.2)
Basos: 1 %
EOS (ABSOLUTE): 0.3 10*3/uL (ref 0.0–0.4)
Eos: 4 %
Hematocrit: 45.5 % (ref 34.0–46.6)
Hemoglobin: 15.1 g/dL (ref 11.1–15.9)
Immature Grans (Abs): 0 10*3/uL (ref 0.0–0.1)
Immature Granulocytes: 0 %
Lymphocytes Absolute: 2.4 10*3/uL (ref 0.7–3.1)
Lymphs: 40 %
MCH: 31.3 pg (ref 26.6–33.0)
MCHC: 33.2 g/dL (ref 31.5–35.7)
MCV: 94 fL (ref 79–97)
Monocytes Absolute: 0.5 10*3/uL (ref 0.1–0.9)
Monocytes: 9 %
Neutrophils Absolute: 2.8 10*3/uL (ref 1.4–7.0)
Neutrophils: 46 %
Platelets: 333 10*3/uL (ref 150–450)
RBC: 4.82 x10E6/uL (ref 3.77–5.28)
RDW: 12.3 % (ref 11.7–15.4)
WBC: 6 10*3/uL (ref 3.4–10.8)

## 2021-06-06 LAB — LIPID PANEL
Chol/HDL Ratio: 3.5 ratio (ref 0.0–4.4)
Cholesterol, Total: 195 mg/dL (ref 100–199)
HDL: 55 mg/dL (ref >39)
LDL Chol Calc (NIH): 112 mg/dL — ABNORMAL HIGH (ref 0–99)
Triglycerides: 158 mg/dL — ABNORMAL HIGH (ref 0–149)
VLDL Cholesterol Cal: 28 mg/dL (ref 5–40)

## 2021-06-06 LAB — VITAMIN B12: Vitamin B-12: 595 pg/mL (ref 232–1245)

## 2021-06-06 LAB — TSH: TSH: 2.38 u[IU]/mL (ref 0.450–4.500)

## 2021-06-06 LAB — CORTISOL: Cortisol: 14.7 ug/dL

## 2021-06-06 LAB — T4, FREE: Free T4: 1.12 ng/dL (ref 0.82–1.77)

## 2021-06-06 NOTE — Telephone Encounter (Signed)
pt is having extreme pain.. cant stand without pain, can only lay on her left side for relief, also feels nauseaus.  Not feeling well and thinks this is an issue with her kidneys. Bp has been high and pt doesnt have diabetes medication so her numbers are high.. back is also hurting.

## 2021-06-06 NOTE — Telephone Encounter (Signed)
Returning call to patient about telephone encounter!   Pain started having pain last Sunday night. Primarily kidney pain on the right side. Feels exteremly nauseated, no throwing up.   Patient had labs yesterday!   "pt is having extreme pain.. cant stand without pain, can only lay on her left side for relief, also feels nauseaus.  Not feeling well and thinks this is an issue with her kidneys. Bp has been high and pt doesnt have diabetes medication so her numbers are high.. back is also hurting."   Advised patient to be seen in the Urgent Care or by Primary care provider. Patient expressed frustration with her primary care provider, but has an appointment with a new provider. Advised her to call her new PCP and see if she could be seen any sooner, but if they cannot patient could benefit from being seen in the Urgent Care!

## 2021-06-06 NOTE — Progress Notes (Signed)
Established Patient Office Visit  Subjective:  Patient ID: [name], female    DOB: February 09, 1961  Age: 61 y.o. MRN: [id number]  CC:  Chief Complaint  Patient presents with   Back Pain    Low back pain, R side, pain is constant,  no urinary symptoms, labs have been done by cardiology    HPI LOREY PALLETT presents for "low back pain on the right side ".  Her pain actually is more right lower thoracic to mid thoracic region.  This started around Sunday.  No significant radiation.  Pain is worse with changing positions such as lying to sitting or sitting to standing.  No urinary symptoms.  No fevers or chills.  No nausea or vomiting.  No radiculitis symptoms.  Denies any lumbar pain.  She took some Tylenol Sunday which did help temporarily.  She has allergy to multiple medications including ibuprofen.  She has chronic problems including hypertension and type 2 diabetes.  Her diabetes and hypertension have been poorly controlled recently.  She is in the process of transitioning to someone else for her diabetes care.  She had multiple recent labs per cardiology and these were reviewed.  Recently went on Aldactone and blood pressure somewhat improved.  Recent renal function stable.  Past Medical History:  Diagnosis Date   Allergic rhinitis    Diabetes mellitus    Hyperlipidemia    Hypertension    x many years (since birht of her son at 77) Had workup with renal artery angiogram that showed no evidence for fibromuscular dysplasia ( no significant renal artery stenosis.) ECHO (8/11) EF 60-65%, mild LVH, no regional WMAs.   Low back pain    Morbid obesity (HCC)    OSA (obstructive sleep apnea) 12/28/2015    Past Surgical History:  Procedure Laterality Date   ABDOMINAL HYSTERECTOMY     partial for fibroids   TUBAL LIGATION      Family History  Problem Relation Age of Onset   Cancer Mother    Hypertension Mother    Other Father        died in MVA   Hypertension Maternal  Grandmother    Stroke Maternal Grandmother     Social History   Socioeconomic History   Marital status: Married    Spouse name: Not on file   Number of children: Not on file   Years of education: Not on file   Highest education level: Not on file  Occupational History   Not on file  Tobacco Use   Smoking status: Never   Smokeless tobacco: Never  Vaping Use   Vaping Use: Never used  Substance and Sexual Activity   Alcohol use: No    Alcohol/week: 0.0 standard drinks   Drug use: No   Sexual activity: Not on file  Other Topics Concern   Not on file  Social History Narrative   Married   Regular exercise-yes   HH of 4    28 and 24    No pets   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    Difficulty of Paying Living Expenses: Not hard at all  Food Insecurity: No Food Insecurity   Worried About Charity fundraiser in the Last Year: Never true   Arboriculturist in the Last Year: Never true  Transportation Needs: No Data processing manager (Medical): No  Lack of Transportation (Non-Medical): No  Physical Activity: Inactive   Days of Exercise per Week: 0 days   Minutes of Exercise per Session: 0 min  Stress: Not on file  Social Connections: Not on file  Intimate Partner Violence: Not on file    Outpatient Medications Prior to Visit  Medication Sig Dispense Refill   albuterol (VENTOLIN HFA) 108 (90 Base) MCG/ACT inhaler INHALE 1-2 PUFFS INTO THE LUNGS 2 (TWO) TIMES DAILY AS NEEDED FOR WHEEZING OR SHORTNESS OF BREATH. 6.7 each 0   Ascorbic Acid (VITAMIN C PO) Take 1 tablet by mouth at bedtime.     Azelastine-Fluticasone (DYMISTA) 137-50 MCG/ACT SUSP Place 2 sprays into the nose daily. 23 g 0   BREO ELLIPTA 200-25 MCG/INH AEPB TAKE 1 PUFF BY MOUTH EVERY DAY 60 each 5   calcium carbonate (OSCAL) 1500 (600 Ca) MG TABS tablet Take 600 mg of elemental calcium by mouth daily.     glucose blood  (ACCU-CHEK GUIDE) test strip Use as instructed to check sugar 3 times daily 300 each 1   KLOR-CON M20 20 MEQ tablet TAKE 1 TABLET BY MOUTH EVERY DAY 90 tablet 0   Lancets Misc. (ACCU-CHEK MULTICLIX LANCET DEV) KIT Use as instructed to check sugar 3 times daily 1 each prn   Magnesium 250 MG TABS Take 1 tablet by mouth daily.     Multiple Vitamin (MULTIVITAMIN WITH MINERALS) TABS tablet Take 1 tablet by mouth at bedtime. One a Day 50 plus     MULTIPLE VITAMIN PO Take by mouth daily.     olmesartan (BENICAR) 20 MG tablet TAKE 1 TABLET BY MOUTH EVERY DAY 90 tablet 1   ONE TOUCH LANCETS MISC Use to check 3 times daily 300 each 5   pantoprazole (PROTONIX) 40 MG tablet TAKE 1 TABLET BY MOUTH EVERY DAY 90 tablet 0   spironolactone (ALDACTONE) 25 MG tablet Take 1 tablet (25 mg total) by mouth daily. 90 tablet 3   No facility-administered medications prior to visit.    Allergies  Allergen Reactions   Metformin And Related Diarrhea    Diarrhea  intolerance at lowest dose   Olmesartan Other (See Comments)    Felt like insides dying   tried for 3 weeks see text   Amlodipine Diarrhea   Candesartan Cilexetil Other (See Comments)    Reaction to Atacand - possibly drowsiness or diarrhea or nausea   Codeine Hives, Itching and Swelling    Possible throat swelling - pt does not recall this reaction   [place] [Dapagliflozin] Other (See Comments)    Memory issues   Hydrochlorothiazide     hypokalemia   Ibuprofen Hives   Telmisartan Other (See Comments)    Reaction to Micardis - possibly drowsiness or diarrhea or nausea   Zithromax [Azithromycin] Diarrhea         ROS Review of Systems  Constitutional:  Negative for appetite change, chills, fever and unexpected weight change.  Gastrointestinal:  Negative for abdominal pain.  Genitourinary:  Negative for dysuria, frequency and hematuria.  Musculoskeletal:  Positive for back pain. Negative for neck pain and neck stiffness.     Objective:     Physical Exam Vitals reviewed.  Constitutional:      Appearance: Normal appearance.  Cardiovascular:     Rate and Rhythm: Normal rate and regular rhythm.  Pulmonary:     Effort: Pulmonary effort is normal.     Breath sounds: Normal breath sounds.  Musculoskeletal:     Comments: No spinal  tenderness.  She has some right parathoracic muscular tenderness.  Neurological:     Mental Status: She is alert.    BP 140/80 (BP Location: Left Arm, Patient Position: Sitting, Cuff Size: Normal)    Pulse 93    Temp 98.3 F (36.8 C) (Oral)    Wt (!) 303 lb 9.6 oz (137.7 kg)    SpO2 94%    BMI 47.55 kg/m  Wt Readings from Last 3 Encounters:  06/06/21 (!) 303 lb 9.6 oz (137.7 kg)  05/15/21 (!) 301 lb 6.4 oz (136.7 kg)  03/28/21 299 lb 12.8 oz (136 kg)     Health Maintenance Due  Topic Date Due   COVID-19 Vaccine (1) Never done   Zoster Vaccines- Shingrix (1 of 2) Never done   TETANUS/TDAP  08/31/2006   COLONOSCOPY (Pts 45-48yr Insurance coverage will need to be confirmed)  Never done   FOOT EXAM  06/26/2017   OPHTHALMOLOGY EXAM  03/18/2021    There are no preventive care reminders to display for this patient.  Lab Results  Component Value Date   TSH 2.380 06/05/2021   Lab Results  Component Value Date   WBC 6.0 06/05/2021   HGB 15.1 06/05/2021   HCT 45.5 06/05/2021   MCV 94 06/05/2021   PLT 333 06/05/2021   Lab Results  Component Value Date   NA 141 06/05/2021   K 4.1 06/05/2021   CO2 26 06/05/2021   GLUCOSE 178 (H) 06/05/2021   BUN 12 06/05/2021   CREATININE 1.00 06/05/2021   BILITOT 0.4 06/05/2021   ALKPHOS 98 06/05/2021   AST 16 06/05/2021   ALT 22 06/05/2021   PROT 7.3 06/05/2021   ALBUMIN 4.1 06/05/2021   CALCIUM 9.1 06/05/2021   ANIONGAP 12 06/02/2016   EGFR 65 06/05/2021   GFR 70.82 11/14/2020   Lab Results  Component Value Date   CHOL 195 06/05/2021   Lab Results  Component Value Date   HDL 55 06/05/2021   Lab Results  Component Value Date    LDLCALC 112 (H) 06/05/2021   Lab Results  Component Value Date   TRIG 158 (H) 06/05/2021   Lab Results  Component Value Date   CHOLHDL 3.5 06/05/2021   Lab Results  Component Value Date   HGBA1C 9.0 (A) 03/28/2021      Assessment & Plan:   Right mid to lower thoracic back pain.  Suspect probably musculoskeletal.  This is near her flank region.  Even though she has no urinary symptoms we will check urine dip to be safe.  -Recommend conservative measures with heat, topical sports creams, Tylenol.  She has been either allergic or very sensitive to multiple medications.  We did discuss possible muscle relaxer if symptoms continue would like to avoid further medications if possible -Patient was unable to void for urine specimen.  She will try to return tomorrow for that.   No orders of the defined types were placed in this encounter.   Follow-up: No follow-ups on file.    BCarolann Littler MD

## 2021-06-06 NOTE — Patient Instructions (Signed)
Recommend conservative measures including Tylenol, topical heat, topical sports creams  If no relief with that we could consider low-dose muscle relaxer at night.  Basically, all muscle relaxers can cause some sedation which would limit daytime use.

## 2021-06-07 ENCOUNTER — Ambulatory Visit (INDEPENDENT_AMBULATORY_CARE_PROVIDER_SITE_OTHER): Payer: No Typology Code available for payment source

## 2021-06-07 ENCOUNTER — Other Ambulatory Visit: Payer: Self-pay

## 2021-06-07 DIAGNOSIS — Z0000 Encounter for general adult medical examination without abnormal findings: Secondary | ICD-10-CM

## 2021-06-07 NOTE — Progress Notes (Signed)
Appointment Outcome: Completed, Session #: 1 Start time: 12:18pm   End time: 12:48pm   Total Mins: 30 minutes  AGREEMENTS SECTION   Overall Goal(s): Stress management                                              Agreement/Action Steps:  Exercise at [facility] 5 days/week from 6:30pm-7:30pm Walk on Saturdays and Sundays [facility] or in neighborhood Journal for 5 minutes during lunch break Implement down time before bed Read scriptures Implement positive self-talk/scripture affirmations Start day with positive self-talk/scripture affirmations Utilize support system  Progress Notes:  Patient rated her current stress level a 6. Patient stated that she was able to go to [facility] once before getting sick. Patient mentioned that she must get back to going to the gym. Patient plans to walk the track and keep her distance from others in the gym. Patient expressed being motivated to get an exercise routine established so she can get her glucose and blood pressure down. Patient is interested in walking in the park but is waiting for the days to be longer so she can walk in the park safely.   Patient is writing on sticky notes as a form of journaling. Patient writes encouraging words/scriptures to keep around her. Patient stated that she will start writing messages to herself and logging readings in one notebook. Patient wants to keep track of her health behaviors in this notebook as well (e.g., I walked today).   Patient has been reading/studying the Bible during her wind down time after work. Patient starts her day with prayer and scripture reading. Patient is applying what she is learning to her everyday life, which has helped her perspective on how she processes stressors.   Patient stated that she has started prioritizing and staying organized. Patient mentioned how this has helped her with meal prepping, cleaning her home. Patient shared that she tries not to overdo it, do what's  required, prioritize what's important and what can wait. Patient is finding a routine that works for her. Patient continues to be supported by her husband.  Indicators of Success and Accountability:  Patient has been reading the Bible frequently and found it comforting and encouraging.  Readiness: Patient is in the action phase of stress management. Strengths and Supports: Patient is being supported by her husband. Patient is organizing and prioritizing responsibilities in life.  Challenges and Barriers: Patient does not foresee any challenges/barriers to implementing her steps over the next two weeks.   Coaching Outcomes: Patient will continue to implement her action steps as outlined above over the next two weeks.   Patient did not make changes to her action steps.   Attempted: Fulfilled - Patient has written on sticky notes to journal during her lunch break. Patient is incorporating a wind down in the evenings with studying the Bible. Patient is implementing positive self-talk/scripture affirmations and prayer and continues to rely on her support system. Partial - Patient was able to go to [facility] once before getting sick.

## 2021-06-08 LAB — URINALYSIS, ROUTINE W REFLEX MICROSCOPIC
Bilirubin Urine: NEGATIVE
Leukocytes,Ua: NEGATIVE
Nitrite: NEGATIVE
Specific Gravity, Urine: 1.025 (ref 1.000–1.030)
Total Protein, Urine: 30 — AB
Urine Glucose: NEGATIVE
Urobilinogen, UA: 0.2 (ref 0.0–1.0)
pH: 5.5 (ref 5.0–8.0)

## 2021-06-11 ENCOUNTER — Telehealth: Payer: Self-pay

## 2021-06-11 ENCOUNTER — Other Ambulatory Visit: Payer: Self-pay

## 2021-06-11 MED ORDER — CEPHALEXIN 500 MG PO CAPS: 500.0000 mg | ORAL_CAPSULE | Freq: Three times a day (TID) | ORAL | 0 refills | Status: DC

## 2021-06-11 NOTE — Telephone Encounter (Signed)
Spoke with the patient to discuss lab results. She asked that a send a message to Dr. Fabian Sharp and let her know that her blood sugar levels have been very high. She has not been able to tolerate her diabetes medication and has not been able to take the medication in 2 months. She stated that she called to get a refill and spoke with a woman who refused to fill her medication and argued with her over it but she does not remember her name. She stated that she has called about this 3 times and never received a call back. Patient is very upset and would like a call back in regards to her medications.

## 2021-06-15 ENCOUNTER — Telehealth: Payer: Self-pay

## 2021-06-15 NOTE — Telephone Encounter (Signed)
VMT pt requesting call back to discuss next PREP class starting  

## 2021-06-18 ENCOUNTER — Telehealth: Payer: Self-pay | Admitting: Internal Medicine

## 2021-06-18 DIAGNOSIS — R1031 Right lower quadrant pain: Secondary | ICD-10-CM

## 2021-06-18 NOTE — Telephone Encounter (Signed)
Patient called in to return someone call. Informed patient that no one from the office called her. Patient then requested to speak with Brittney again. Informed her that Philippa Chester would call her once she gets a chance.  Patient requested a phone call back on her cell  phone number [phone].  Please advise.

## 2021-06-18 NOTE — Telephone Encounter (Signed)
Patient stated that she is still experiencing pain from visit on 2/01. Patient stated that the antibiotics aren't working. Patient is requesting to speak with someone because she want to know about her kidneys.  Patient could be contacted at [phone].  Please advise.

## 2021-06-18 NOTE — Telephone Encounter (Signed)
Spoke with the patient. She has a follow up with Panosh on 2/20. She would like to know if her UA showed that she could possible kidney stones.   Please advise.   Per patient ok to leave a detailed message.

## 2021-06-19 NOTE — Telephone Encounter (Signed)
ATC, voice mail is full.  Patient was told that more than one urine test had been ordered and one of them had not been resulted, not that a urine test was not done.

## 2021-06-19 NOTE — Telephone Encounter (Signed)
Patient was last seen by Dr. Elease Hashimoto on 06/06/21 for right flank pain. Patient has upcoming appt 06/25/21 Please advise

## 2021-06-19 NOTE — Telephone Encounter (Signed)
Pt saw Dr. Caryl Never about 2 weeks ago--prescribed an antibiotic, however patient is still hurting.  Pt is wanting to know if she needs another antibiotic or a urine test.  [facility] did a urine test, however they told the patient yesterday that they didn't do one.  Patient is wondering if she has a bladder or kidney infection or possible kidney stones.    Patient called yesterday and still hasn't heard anything back.  She is upset and requesting a call back.   Patient wants to talk to [place].  [phone]

## 2021-06-19 NOTE — Telephone Encounter (Signed)
Need more information and a viist  to be able to give advise ( I see that she has  one on feb 20)    I see that she saw dr Caryl Never   for possible UTI . Sorry she is  having such problems  And also with    many of the medications that we have tried .or prescribed to help blood sugar  If blood sugar  is very high we may need to add  back insulin  in the short run and try a different medication . IS there a medication  we  need to refill before her visit?

## 2021-06-20 ENCOUNTER — Other Ambulatory Visit: Payer: Self-pay

## 2021-06-20 NOTE — Addendum Note (Signed)
Addended by: Laneta Simmers L on: 06/20/2021 10:00 AM   Modules accepted: Orders

## 2021-06-20 NOTE — Telephone Encounter (Signed)
I spoke with the pt and she expressed concern on her current health and labs that were done on 06/08/2021. Pt inquired if she has kidney stones due to back pain that she has been experiencing since her last OV. Pt informed me that she talked to staff member last week and they informed her that no UA was done and pt questioned why she was prescribed antibiotics. I informed pt that UA was done and has been resulted by Dr. Caryl Never. Pt stated that she will discuss ongoing issues during her appt next week with PCP. Pt reported that she has not been taking any of the prescribed blood sugar medications at this time as she states the medications has not been working or she has had adverse reactions to previous medications.

## 2021-06-20 NOTE — Telephone Encounter (Signed)
ATC pt but could not leave vm due to mailbox being full.

## 2021-06-21 ENCOUNTER — Other Ambulatory Visit: Payer: Self-pay

## 2021-06-21 ENCOUNTER — Ambulatory Visit (INDEPENDENT_AMBULATORY_CARE_PROVIDER_SITE_OTHER): Payer: No Typology Code available for payment source

## 2021-06-21 DIAGNOSIS — Z0000 Encounter for general adult medical examination without abnormal findings: Secondary | ICD-10-CM

## 2021-06-21 MED ORDER — AZELASTINE-FLUTICASONE 137-50 MCG/ACT NA SUSP: 2.0000 | Freq: Every day | NASAL | 0 refills | Status: DC

## 2021-06-21 NOTE — Telephone Encounter (Signed)
Please see more recent phone encounters.

## 2021-06-21 NOTE — Telephone Encounter (Signed)
Spoke with the patient. She is aware that an order for a renal CT has been placed and that someone will be reaching out to her to get her scheduled. Nothing further needed.

## 2021-06-21 NOTE — Addendum Note (Signed)
Addended by: Solon Augusta on: 06/21/2021 10:11 AM   Modules accepted: Orders

## 2021-06-21 NOTE — Telephone Encounter (Signed)
Pt still having low back pain.   In light of recent hematuria and ongoing pain recommend CT renal stone study- and also keep follow up with Dr Regis Bill for the 20 th.

## 2021-06-21 NOTE — Progress Notes (Signed)
Appointment Outcome: Completed, Session #: 2 Start time: 12:16pm   End time: 12:41pm   Total Mins: 25 minutes  AGREEMENTS SECTION    Overall Goal(s): Stress management                                              Agreement/Action Steps:  Exercise at [place] 5 days/week from 6:30pm-7:30pm Walk on Saturdays and Sundays [facility] or in neighborhood Journal for 5 minutes during lunch break Implement down time before bed Read scriptures Implement positive self-talk/scripture affirmations Start day with positive self-talk/scripture affirmations Utilize support system  Progress Notes:  Patient was not able to exercise at [employer] 5 days a week. Patient has walked in the park approximately 5 days in the past two weeks. Patient had challenges with being able to be consistent with walking due to allergies. Patient stated that she may have to go back in the gym or try wearing a mask if she walks outside. Patient mentioned that if she were to go to the gym, she would walk on the track.   Patient stated that she is taking time each day to write notes to herself about things that happened, what she ate, and how she's feeling to keep track of the things that may relate to her blood pressure being elevated. This step allows her to check-in with herself to determine how she is feeling in those moments and determine what her next steps are.   Patient stated that she has started limiting herself to eating out at [employer] once weekly due to the amount of sodium in the food. Patient stated that she tries to stick with a salad and chicken when they do eat out.   Patient mentioned that she hasn't been able to implement a wind down before bed because she has been feeling bad and exhausted. Patient stated that the earliest she finds herself falling asleep is 5:30pm and at the latest 9:00pm. Patient stated that she is not resting well due to some discomfort. Patient stated that she is reading the  Bible more on Saturdays and is managing to go to church on Sundays to aid with stress management. Patient shared that she also listens to Garald Braver for inspiration when she is in the car.   Patient stated that she is implementing positive self-talk/scripture affirmations. Patient reported that she continues to start her day with this step to set the tone. Patient mentioned that she is interested in typing some quotes and laminating them to put up in her office space to remind her of things that she can speak positively to herself.   Patient is utilizing her support system. Patient reported that she has a close girlfriend that they text each other for encouragement in addition to talking to her husband.   Indicators of Success and Accountability:  Patient stated that realizing that she is stressed, reading her Bible, conducting self-check-ins, and being flexible are her indicators of success and accountability. Readiness: Patient is in the action phase of stress management.  Strengths and Supports: Patient is being supported by her husband and a close friend. Patient stated that her strength lies in her faith.  Challenges and Barriers: Patient is experiencing pain, which could be a challenge/barrier to her being physically active.    Coaching Outcomes: Patient will continue to work towards implementing her action steps as outlined above  over the next two weeks. No changes were made to her action steps during this session.    Attempted: Fulfilled - Patient is writing daily, implementing positive self-talk/scripture affirmations to start each morning and to manage stress. Patient is utilizing her support system.  Partial - Patient walked approximately 5 days in the past two weeks in the park.  Not met - Patient was not able to implement a wind down before bed due to not feeling well and being exhausted.

## 2021-06-25 ENCOUNTER — Encounter: Payer: Self-pay | Admitting: Internal Medicine

## 2021-06-25 ENCOUNTER — Other Ambulatory Visit: Payer: Self-pay

## 2021-06-25 ENCOUNTER — Ambulatory Visit: Payer: No Typology Code available for payment source | Admitting: Internal Medicine

## 2021-06-25 ENCOUNTER — Encounter: Payer: Self-pay | Admitting: Pharmacist Clinician (PhC)/ Clinical Pharmacy Specialist

## 2021-06-25 ENCOUNTER — Ambulatory Visit
Payer: No Typology Code available for payment source | Admitting: Pharmacist Clinician (PhC)/ Clinical Pharmacy Specialist

## 2021-06-25 ENCOUNTER — Ambulatory Visit
Admission: RE | Admit: 2021-06-25 | Discharge: 2021-06-25 | Disposition: A | Discharge status: Home / Self Care | Payer: No Typology Code available for payment source | Source: Ambulatory Visit | Attending: Family Medicine | Admitting: Family Medicine

## 2021-06-25 ENCOUNTER — Telehealth: Payer: Self-pay | Admitting: Internal Medicine

## 2021-06-25 ENCOUNTER — Telehealth: Payer: Self-pay

## 2021-06-25 VITALS — BP 150/102 | HR 86 | Temp 99.0°F | Ht 66.0 in | Wt 300.2 lb

## 2021-06-25 VITALS — BP 182/96 | HR 83 | Resp 15 | Ht 66.0 in | Wt 300.8 lb

## 2021-06-25 DIAGNOSIS — I10 Essential (primary) hypertension: Secondary | ICD-10-CM | POA: Diagnosis not present

## 2021-06-25 DIAGNOSIS — Z8059 Family history of malignant neoplasm of other urinary tract organ: Secondary | ICD-10-CM

## 2021-06-25 DIAGNOSIS — N281 Cyst of kidney, acquired: Secondary | ICD-10-CM

## 2021-06-25 DIAGNOSIS — N200 Calculus of kidney: Secondary | ICD-10-CM | POA: Diagnosis not present

## 2021-06-25 DIAGNOSIS — R109 Unspecified abdominal pain: Secondary | ICD-10-CM | POA: Diagnosis not present

## 2021-06-25 DIAGNOSIS — R1031 Right lower quadrant pain: Secondary | ICD-10-CM

## 2021-06-25 DIAGNOSIS — E1165 Type 2 diabetes mellitus with hyperglycemia: Secondary | ICD-10-CM

## 2021-06-25 DIAGNOSIS — Z79899 Other long term (current) drug therapy: Secondary | ICD-10-CM

## 2021-06-25 LAB — POCT GLYCOSYLATED HEMOGLOBIN (HGB A1C): Hemoglobin A1C: 8.9 % — AB (ref 4.0–5.6)

## 2021-06-25 MED ORDER — SPIRONOLACTONE 50 MG PO TABS: 50.0000 mg | ORAL_TABLET | Freq: Every day | ORAL | 3 refills | Status: DC

## 2021-06-25 MED ORDER — OZEMPIC (0.25 OR 0.5 MG/DOSE) 2 MG/1.5ML ~~LOC~~ SOPN: 0.2500 mg | PEN_INJECTOR | SUBCUTANEOUS | 1 refills | Status: DC

## 2021-06-25 NOTE — Progress Notes (Addendum)
Chief Complaint  Patient presents with   Follow-up    Had ct scan today   Diabetes   Medication Management    HPI: RAZIA SCREWS 61 y.o. come in for a number of problems Back pain treated for UTI see Dr. Erick Blinks nose pain is mostly on the right and mid very uncomfortable hard to sleep on the left side not radiating usually to the abdomen but sometimes around not particularly on the left no associated fever was treated for potential UTI based on urinalysis but not enough QNS for culture. Had a CT scan renal stone study today which showed a 9 mm minimally obstructing stone on the left ureter a complex probable cyst on the right recommending ultrasound or follow-up a small ventral hernia with fat no other significant findings see below She finally has a new patient appointment with the diabetes specialist who also has an educator at the end of March.  For a few months she has been on no medicine for diabetes has just been trying to control with diet her sugars are usually in the low 200s.  Last medication was [place]  type medicine and she had a severe yeast infection  and memory concerns  with this ;in the remote past she was on some basal insulin and uncertain how effective it was but has not been on it recently.  Has reluctance and concerns about new medicines with the list of potential side effects.  Not sure what she can do but needs help. Hypertension :difficult control; because of intolerance of a number of medicine groups and/or ineffectiveness.  But she is now on the hypertension clinic and they are working with her with some progress.  She feels good about this his had side effects or swelling with multiple medications or they were ineffective.   ROS: See pertinent positives and negatives per HPI. Negative family history of kidney stones but apparent died of what she calls urethral cancer. Past Medical History:  Diagnosis Date   Allergic rhinitis    Diabetes mellitus     Hyperlipidemia    Hypertension    x many years (since birht of her son at 41) Had workup with renal artery angiogram that showed no evidence for fibromuscular dysplasia ( no significant renal artery stenosis.) ECHO (8/11) EF 60-65%, mild LVH, no regional WMAs.   Low back pain    Morbid obesity (HCC)    OSA (obstructive sleep apnea) 12/28/2015    Family History  Problem Relation Age of Onset   Cancer Mother    Hypertension Mother    Other Father        died in MVA   Hypertension Maternal Grandmother    Stroke Maternal Grandmother     Social History   Socioeconomic History   Marital status: Married    Spouse name: Not on file   Number of children: Not on file   Years of education: Not on file   Highest education level: Not on file  Occupational History   Not on file  Tobacco Use   Smoking status: Never   Smokeless tobacco: Never  Vaping Use   Vaping Use: Never used  Substance and Sexual Activity   Alcohol use: No    Alcohol/week: 0.0 standard drinks   Drug use: No   Sexual activity: Not on file  Other Topics Concern   Not on file  Social History Narrative   Married   Regular exercise-yes   HH of 4  69 and 24    No pets   Works at [employer]: Low Risk    Difficulty of Paying Living Expenses: Not hard at all  Food Insecurity: No Food Insecurity   Worried About Charity fundraiser in the Last Year: Never true   Arboriculturist in the Last Year: Never true  Transportation Needs: No Transportation Needs   Lack of Transportation (Medical): No   Lack of Transportation (Non-Medical): No  Physical Activity: Inactive   Days of Exercise per Week: 0 days   Minutes of Exercise per Session: 0 min  Stress: Not on file  Social Connections: Not on file    Outpatient Medications Prior to Visit  Medication Sig Dispense Refill   albuterol (VENTOLIN HFA) 108 (90 Base) MCG/ACT inhaler INHALE 1-2 PUFFS  INTO THE LUNGS 2 (TWO) TIMES DAILY AS NEEDED FOR WHEEZING OR SHORTNESS OF BREATH. 6.7 each 0   Ascorbic Acid (VITAMIN C PO) Take 1 tablet by mouth at bedtime.     Azelastine-Fluticasone (DYMISTA) 137-50 MCG/ACT SUSP Place 2 sprays into the nose daily. 23 g 0   BEE POLLEN PO Take by mouth.     Biotin 10000 MCG TABS Take 1 tablet by mouth daily.     BREO ELLIPTA 200-25 MCG/INH AEPB TAKE 1 PUFF BY MOUTH EVERY DAY 60 each 5   calcium carbonate (OSCAL) 1500 (600 Ca) MG TABS tablet Take 600 mg of elemental calcium by mouth daily.     Cholecalciferol (VITAMIN D3) 10 MCG (400 UNIT) CAPS Take 2 tablets by mouth daily.     Flaxseed, Linseed, (FLAX SEEDS) POWD Take by mouth.     glucose blood (ACCU-CHEK GUIDE) test strip Use as instructed to check sugar 3 times daily 300 each 1   guaifenesin (HUMIBID E) 400 MG TABS tablet Take 400 mg by mouth every 4 (four) hours.     Lancets Misc. (ACCU-CHEK MULTICLIX LANCET DEV) KIT Use as instructed to check sugar 3 times daily 1 each prn   loratadine (CLARITIN) 10 MG tablet Take 10 mg by mouth daily.     Magnesium 250 MG TABS Take 1 tablet by mouth daily.     Multiple Vitamin (MULTIVITAMIN WITH MINERALS) TABS tablet Take 1 tablet by mouth at bedtime. One a Day 50 plus     pantoprazole (PROTONIX) 40 MG tablet TAKE 1 TABLET BY MOUTH EVERY DAY 90 tablet 0   spironolactone (ALDACTONE) 50 MG tablet Take 1 tablet (50 mg total) by mouth daily. 90 tablet 3   vitamin C (ASCORBIC ACID) 500 MG tablet Take 500 mg by mouth 2 (two) times daily.     No facility-administered medications prior to visit.     EXAM:  BP (!) 150/102 (BP Location: Right Arm, Patient Position: Sitting, Cuff Size: Normal)    Pulse 86    Temp 99 F (37.2 C) (Oral)    Ht _0  (1.676 m)    Wt (!) 300 lb 3.2 oz (136.2 kg)    SpO2 97%    BMI 48.45 kg/m   Body mass index is 48.45 kg/m.  GENERAL: vitals reviewed and listed above, alert, oriented, appears well hydrated and in no acute distress HEENT:  atraumatic, conjunctiva  clear, no obvious abnormalities on inspection of external nose and ears OP : masked  NECK: no obvious masses on inspection palpation  LUNGS: clear to auscultation bilaterally, no wheezes, rales or  rhonchi, good air movement CV: HRRR, no clubbing cyanosis onl cap refill  Pints to area of r flank and mid  back   MS: moves all extremities without noticeable focal  abnormality PSYCH: pleasant and cooperative, no obvious depression or anxiety Lab Results  Component Value Date   WBC 6.0 06/05/2021   HGB 15.1 06/05/2021   HCT 45.5 06/05/2021   PLT 333 06/05/2021   GLUCOSE 178 (H) 06/05/2021   CHOL 195 06/05/2021   TRIG 158 (H) 06/05/2021   HDL 55 06/05/2021   LDLCALC 112 (H) 06/05/2021   ALT 22 06/05/2021   AST 16 06/05/2021   NA 141 06/05/2021   K 4.1 06/05/2021   CL 99 06/05/2021   CREATININE 1.00 06/05/2021   BUN 12 06/05/2021   CO2 26 06/05/2021   TSH 2.380 06/05/2021   INR 1.0 RATIO 07/08/2006   HGBA1C 8.9 (A) 06/25/2021   MICROALBUR 2.4 (H) 05/25/2018   BP Readings from Last 3 Encounters:  06/25/21 (!) 150/102  06/25/21 (!) 182/96  06/06/21 140/80  Ct reading  IMPRESSION: 9 mm minimally obstructing proximal left ureteral calculus as above.   3.8 cm right kidney posterior hypodense cortical lesion favored to represent a renal cyst. Consider correlation with renal ultrasound for confirmation.   Small midline fat containing abdominal wall ventral hernia.   Minor scattered colonic diverticulosis without acute inflammatory process.   Remote hysterectomy  ASSESSMENT AND PLAN:  Discussed the following assessment and plan:  Uncontrolled type 2 diabetes mellitus with hyperglycemia ([place]) - Plan: POC HgB A1c  Left renal stone  Flank pain - on right and mid    Renal cyst, right  Essential hypertension  Medication management  Morbid obesity, unspecified obesity type ([place])  Family history, urinary cancer Multiple issues and findings  discussed see above and below. Hypertension under care of hypertension clinic medicine recently adjusted. Left renal stone 9 mm somewhat large uncertain will pass possibly radiology reports that is minimally obstructed but should be followed up in addition to lesion on right kidney that appears to be complex which also needs a follow-up. I suggest and advised referral for urology consult to address both of these. To stay hydrated at this point we talked about pain medicine uncertain if her back pain is from her renal lesions or stone.  She can use local patch declined narcotics appropriately. In regard to the diabetes that has been out of control for a while ,her history of side effects from a number of medicines , reluctance and concerned about side effects ,she is willing to try a coupon given for Ozempic and a printed prescription that she can start before she sees a diabetologist. Reviewed side effects profile potential. If she chose to work on a gastric bariatric surgery she would have to have hypertension and sleep apnea controlled. She wants to avoid medications that can cause drowsiness so it does not affect her job. -Patient advised to return or notify health care team  if  new concerns arise. Record review discussion addressed medicine 45 minutes.  Addendum information patient never got the MRI of her brain when she had her imbalance walking she The given appointment time but never called to reschedule apparently those symptoms come and go but not there today. Patient Instructions  A1`c is 8.9   fortunatly not worse !.  Keep  working on  diet  control. Keep diabetes appt. Consider ozempic  weekly injection or rybeslsus   The kidney stone on the left  may  pass but is bigger than some and had some minimal obstruction ., Stay hydrated  . Will need follow up.  We can do an ultraoudn but I think best done by urology  Pain seems to be on the other side but is reminiscent of kidney pain .   Can try topical  OTC patches also   Plan follow up depending  . Or when due for cpx      IMPRESSION: 9 mm minimally obstructing proximal left ureteral calculus as above.   3.8 cm right kidney posterior hypodense cortical lesion favored to represent a renal cyst. Consider correlation with renal ultrasound for confirmation.   Small midline fat containing abdominal wall ventral hernia.   Minor scattered colonic diverticulosis without acute inflammatory process.   Remote hysterectomy   Standley Brooking. Barnet Benavides M.D.

## 2021-06-25 NOTE — Telephone Encounter (Signed)
I received stat report from radiology stating the pt has 9 mm minimally abstracting proximal left ureteral calculus. 3.8 cm right kidney posterior hypodense cortical lesion. 3% renal cysts, consider renal ultrasound for follow-up. Pt also has small midline fat containing abdominal hernia. Radiologist also reported  minor scattered diverticulosis without acute inflammatory process. Pt also had remote hysterectomy. Please advise

## 2021-06-25 NOTE — Telephone Encounter (Signed)
See note from today has been referred to urology  by Dr Caryl Never and agree with this

## 2021-06-25 NOTE — Progress Notes (Signed)
06/25/2021 [name] [birth year] [id number]   HPI:     SW is a pleasant 40 YOF patient of Dr. Skeet Latch. She was last seen by our clinic on 05/15/21 and her office BP was 196/92; equally uncontrolled at home but believes her cuff is too small. At this visit, Spironolactone 81m daily was added in addition to her Olmesartan. She was previously taking 448mOlmesartan but decreased it due to complaints of pain. It was recommended she increase it back to 408mt that visit as well. Medication selection and titration is hindered by several sensitivities. She has tried Carvedilol, Olmesartan, Candesartan, and HCTZ in the past with intolerances listed below. She expressed willingness to reattempt past agents. At a recent visit with family medicine on 06/06/21, her office BP was better controlled at 140/80.    In the interim, she had complaints of R flank pain accompanied by hematuria. She completed two weeks of Cephalexin and still had pain. CT for renal stone study performed 06/25/21 showed minimally obstructing proximal left ureteral calcus and possible right renal cyst. Radiologist also reported minor scattered diverticulosis without acute inflammatory process.    Today, her BP in the clinic is 182/96 and home pressures averaging 183/100 in the AM and 180/96 in the PM. Her current cuff is too small for her arm size (~44cm/17) but corresponding within 5 mmHg of clinic readings. She reports no adverse effects with spironolactone and is not taking Olmesartan. She also started taking [employer] of [employer] probiotics    Patient denies confusion, chest pain, swelling, vision changes, shortness of breath, headaches, dizziness, palpitations or arrhythmias     Patient endorses occasional swelling.   Labs (06/05/21): SCr 1.00, BUN 12, K 4.1, cortisol 14.7, TSH 2.38, all labs WNL   Past Medical History:  DM A1c 9% (03/28/21) No agents, in process of finding provider for DM care  HTN - Spironolactone  67m41mLD ASCVD 10-year risk: 17.4%; LDL 112 (06/05/21) No statin  OSA 12/28/15 Does not tolerate mask, concern for cost of sleep study  Recurrent bronchospasm, chronic cough, ?hyperaldosteronism vs low-renin HTN (ALDO/PRA ratio 143.8 on 02/11/13)    Blood Pressure Goal: < 130/80 Home Average: 183/100 in AM; 180/96 in PM   FH: cancer and HTN in mother, unknown history father, HTN and stroke in maternal grandmother   SH: does not smoke or drink; works from home for [employer] with insurance   Current CV agents: Olmesartan 20mg51mly, Spironolactone 67mg 30my   Past CV agents: Amlodipine (diarrhea, Candesartan (drowsiness, diarrhea, or nausea), HCTZ (hypokalemia), Telmisartan (drowsiness, diarrhea, or nausea), Farxiga (memory issues, yeast infections)     Patient-reported dietary habits:  o   Breakfast: oatmeal with banana; smoothie with blue berries, water or protein scoop, flax seed, bee pollen, peanut butter o   Lunch: salad, no meat with nuts; o   Dinner: salads for dinner with meat (chicken from [employer]); olive oil or [place]  o   Snacks: drinks coffee in the morning three times per week; yogurt cup or granola   Patient-reported exercise habits:  o   [facility] but stopped because of her allergies    Wt Readings from Last 3 Encounters:  06/25/21 (!) 300 lb 3.2 oz (136.2 kg)  06/25/21 (!) 300 lb 12.8 oz (136.4 kg)  06/06/21 (!) 303 lb 9.6 oz (137.7 kg)   BP Readings from Last 3 Encounters:  06/25/21 (!) 150/102  06/25/21 (!)[name]  182/96  06/06/21 140/80   Pulse Readings from Last 3 Encounters:  06/25/21 86  06/25/21 83  06/06/21 93    Current Outpatient Medications  Medication Sig Dispense Refill   albuterol (VENTOLIN HFA) 108 (90 Base) MCG/ACT inhaler INHALE 1-2 PUFFS INTO THE LUNGS 2 (TWO) TIMES DAILY AS NEEDED FOR WHEEZING OR SHORTNESS OF BREATH. 6.7 each 0   Ascorbic Acid (VITAMIN C PO) Take 1 tablet by mouth at bedtime.      Azelastine-Fluticasone (DYMISTA) 137-50 MCG/ACT SUSP Place 2 sprays into the nose daily. 23 g 0   BEE POLLEN PO Take by mouth.     Biotin 10000 MCG TABS Take 1 tablet by mouth daily.     BREO ELLIPTA 200-25 MCG/INH AEPB TAKE 1 PUFF BY MOUTH EVERY DAY 60 each 5   calcium carbonate (OSCAL) 1500 (600 Ca) MG TABS tablet Take 600 mg of elemental calcium by mouth daily.     Cholecalciferol (VITAMIN D3) 10 MCG (400 UNIT) CAPS Take 2 tablets by mouth daily.     Flaxseed, Linseed, (FLAX SEEDS) POWD Take by mouth.     glucose blood (ACCU-CHEK GUIDE) test strip Use as instructed to check sugar 3 times daily 300 each 1   guaifenesin (HUMIBID E) 400 MG TABS tablet Take 400 mg by mouth every 4 (four) hours.     Lancets Misc. (ACCU-CHEK MULTICLIX LANCET DEV) KIT Use as instructed to check sugar 3 times daily 1 each prn   loratadine (CLARITIN) 10 MG tablet Take 10 mg by mouth daily.     Magnesium 250 MG TABS Take 1 tablet by mouth daily.     Multiple Vitamin (MULTIVITAMIN WITH MINERALS) TABS tablet Take 1 tablet by mouth at bedtime. One a Day 50 plus     pantoprazole (PROTONIX) 40 MG tablet TAKE 1 TABLET BY MOUTH EVERY DAY 90 tablet 0   spironolactone (ALDACTONE) 50 MG tablet Take 1 tablet (50 mg total) by mouth daily. 90 tablet 3   vitamin C (ASCORBIC ACID) 500 MG tablet Take 500 mg by mouth 2 (two) times daily.     Semaglutide,0.25 or 0.5MG/DOS, (OZEMPIC, 0.25 OR 0.5 MG/DOSE,) 2 MG/1.5ML SOPN Inject 0.25 mg into the skin once a week. 1.5 mL 1   No current facility-administered medications for this visit.    Allergies  Allergen Reactions   Metformin And Related Diarrhea    Diarrhea  intolerance at lowest dose   Olmesartan Other (See Comments)    Felt like insides dying   tried for 3 weeks see text   Amlodipine Diarrhea   Candesartan Cilexetil Other (See Comments)    Reaction to Atacand - possibly drowsiness or diarrhea or nausea   Codeine Hives, Itching and Swelling    Possible throat swelling -  pt does not recall this reaction   [place] [Dapagliflozin] Other (See Comments)    Memory issues   Hydrochlorothiazide     hypokalemia   Ibuprofen Hives   Telmisartan Other (See Comments)    Reaction to Micardis - possibly drowsiness or diarrhea or nausea   Zithromax [Azithromycin] Diarrhea         Past Medical History:  Diagnosis Date   Allergic rhinitis    Diabetes mellitus    Hyperlipidemia    Hypertension    x many years (since birht of her son at 72) Had workup with renal artery angiogram that showed no evidence for fibromuscular dysplasia ( no significant renal artery stenosis.) ECHO (8/11) EF 60-65%, mild LVH, no regional  WMAs.   Low back pain    Morbid obesity (HCC)    OSA (obstructive sleep apnea) 12/28/2015    Blood pressure (!) 182/96, pulse 83, resp. rate 15, height '5\' 6"'  (1.676 m), weight (!) 300 lb 12.8 oz (136.4 kg), SpO2 90 %.  Essential hypertension Blood pressure is uncontrolled on current regimen. Reports medication adherence and no adverse effects from Spironolactone. BP goal < 130/80. BMP from 06/05/21 unremarkable.    Increase Spironolactone to 69m daily May consider ACEi in future given DM comorbidity May consider thiazide diuretic and/or CCB retrial Call if experiencing adverse effects rather than waiting for next follow-up  [place] of 2023  I was with patient and student for entire appointment and agree with above assessment and plan.  KTommy MedalPharmD CPP [facility] [address] [phone]

## 2021-06-25 NOTE — Patient Instructions (Addendum)
A1`c is 8.9   fortunatly not worse !.  Keep  working on  diet  control. Keep diabetes appt. Consider ozempic  weekly injection or rybeslsus   The kidney stone on the left  may pass but is bigger than some and had some minimal obstruction ., Stay hydrated  . Will need follow up.  We can do an ultraoudn but I think best done by urology  Pain seems to be on the other side but is reminiscent of kidney pain .  Can try topical  OTC patches also   Plan follow up depending  . Or when due for cpx      IMPRESSION: 9 mm minimally obstructing proximal left ureteral calculus as above.   3.8 cm right kidney posterior hypodense cortical lesion favored to represent a renal cyst. Consider correlation with renal ultrasound for confirmation.   Small midline fat containing abdominal wall ventral hernia.   Minor scattered colonic diverticulosis without acute inflammatory process.   Remote hysterectomy

## 2021-06-25 NOTE — Patient Instructions (Signed)
Return for a a follow up appointment March 15 at 8 am  Go to the lab in 2 weeks (first week of March) to check kidney function and electrolytes  Take your BP meds as follows:  Increase spironolactone to 50 mg once daily  Bring all of your meds, your BP cuff and your record of home blood pressures to your next appointment.  Exercise as youre able, try to walk approximately 30 minutes per day.  Keep salt intake to a minimum, especially watch canned and prepared boxed foods.  Eat more fresh fruits and vegetables and fewer canned items.  Avoid eating in fast food restaurants.    HOW TO TAKE YOUR BLOOD PRESSURE: Rest 5 minutes before taking your blood pressure.  Dont smoke or drink caffeinated beverages for at least 30 minutes before. Take your blood pressure before (not after) you eat. Sit comfortably with your back supported and both feet on the floor (dont cross your legs). Elevate your arm to heart level on a table or a desk. Use the proper sized cuff. It should fit smoothly and snugly around your bare upper arm. There should be enough room to slip a fingertip under the cuff. The bottom edge of the cuff should be 1 inch above the crease of the elbow. Ideally, take 3 measurements at one sitting and record the average.

## 2021-06-25 NOTE — Telephone Encounter (Signed)
Pt has an appt today with dr Fabian Sharp and calling to let md know she was able to get ct renal study done today at [facility] today and would like the results when she comes in today

## 2021-06-25 NOTE — Assessment & Plan Note (Signed)
Blood pressure is uncontrolled on current regimen. Reports medication adherence and no adverse effects from Spironolactone. BP goal < 130/80. BMP from 06/05/21 unremarkable.    Increase Spironolactone to 50mg  daily May consider ACEi in future given DM comorbidity May consider thiazide diuretic and/or CCB retrial Call if experiencing adverse effects rather than waiting for next follow-up

## 2021-06-26 ENCOUNTER — Ambulatory Visit: Payer: No Typology Code available for payment source

## 2021-06-26 NOTE — Telephone Encounter (Signed)
Noted  

## 2021-06-29 ENCOUNTER — Telehealth: Payer: Self-pay | Admitting: Internal Medicine

## 2021-06-29 NOTE — Telephone Encounter (Signed)
I received a call from [facility] regarding pt referral being sent to [facility] in [place]. Constance Holster stated pt wanted referral to be sent to the [facility] office. Constance Holster stated it may have been a mistake on the location. I advised that there was no location specified on the referral so the referral was sent to a location near pt. I re sent referral to [facility] in [place]. I received a teams message from [facility] stating that pt has made her own appt and per pt they  just need the referral. I responded referral has been sent.

## 2021-07-04 LAB — BASIC METABOLIC PANEL
BUN/Creatinine Ratio: 17 (ref 9–23)
BUN: 17 mg/dL (ref 6–24)
CO2: 21 mmol/L (ref 20–29)
Calcium: 9.4 mg/dL (ref 8.7–10.2)
Chloride: 101 mmol/L (ref 96–106)
Creatinine, Ser: 1.02 mg/dL — ABNORMAL HIGH (ref 0.57–1.00)
Glucose: 212 mg/dL — ABNORMAL HIGH (ref 70–99)
Potassium: 4.3 mmol/L (ref 3.5–5.2)
Sodium: 140 mmol/L (ref 134–144)
eGFR: 64 mL/min/{1.73_m2} (ref >59)

## 2021-07-05 ENCOUNTER — Other Ambulatory Visit: Payer: Self-pay

## 2021-07-05 ENCOUNTER — Ambulatory Visit (INDEPENDENT_AMBULATORY_CARE_PROVIDER_SITE_OTHER): Payer: No Typology Code available for payment source

## 2021-07-05 DIAGNOSIS — Z0000 Encounter for general adult medical examination without abnormal findings: Secondary | ICD-10-CM

## 2021-07-05 NOTE — Progress Notes (Signed)
Appointment Outcome:  ?Completed, Session #: 3  ?Start time: 12:16pm   End time: 12:37pm   Total Mins: 21 minutes ? ?Patient wasn't checked in until after session started at 12:16pm. ? ?AGREEMENTS SECTION ? ? ?Overall Goal(s): ?Stress management                                            ?  ?Agreement/Action Steps:  ?Exercise at [facility] 5 days/week from 6:30pm-7:30pm ?Walk on Saturdays and Sundays ?[employer] or in neighborhood ?Journal for 5 minutes during lunch break ?Implement down time before bed ?Read scriptures ?Implement positive self-talk/scripture affirmations ?Start day with positive self-talk/scripture affirmations ?Utilize support system ? ? ?Progress Notes:  ?Patient shared that she has walked in her neighborhood when she was feeling up to it. Patient explained that she has not been feeling well in the past few weeks, which has impacted her ability to be as physically active as she would like. Patient stated that once she can resolve her health issues and feel better, she will return to exercising.  ? ?Patient stated that she has not been journaling like she intended. However, patient write notes and reminders about things that are going on. Patient shared that she does have a notebook where she writes her scriptures in. Patient mentioned finding scriptures that relates to various situations that she can say to herself as a means of positive self-talk that helps manage stress as well. Patient continues to read her scriptures daily.  ? ?Patient continues to start her day with scripture/affirmations. Patient is being supported by her husband.  ? ?Indicators of Success and Accountability:  Patient has been consistent with implementing her action steps over the past two weeks to manage everyday stress.  ?Readiness: Patient is in the action phase of stress management.  ?Strengths and Supports: Patient is being supported by her husband. Patient is consistent with implementing her action steps.   ?Challenges and Barriers: Patient does not foresee any challenges to implementing her action steps moving forward.  ? ?Coaching Outcomes: ?Patient stated that these health coaching sessions has reminded her of things that she wanted to get back to doing. Patient stated that she has also worked in being more organized, which has helped her reduce stress. Patient stated that she has a daily schedule and is able to take one day at a time.  ? ?Patient expressed that this would be her last health coaching session. Health coaching agreement has been terminated as of 07/05/2021. ? ?Attempted: ?Fulfilled - Patient is implementing positive self-talk/scripture affirmations and utilizing her support system. ?Partial - Patient is engaging in a form of journaling by writing herself notes or scriptures.  ?Not met - Patient has not been able to exercise at [facility]. Patient is not implementing a wind down time before bed.  ? ? ?

## 2021-07-16 ENCOUNTER — Other Ambulatory Visit: Payer: Self-pay | Admitting: Urology

## 2021-07-16 NOTE — Progress Notes (Signed)
DUE TO COVID-19 ONLY ONE VISITOR IS ALLOWED TO COME WITH YOU AND STAY IN THE WAITING ROOM ONLY DURING PRE OP AND PROCEDURE DAY OF SURGERY.  2 VISITOR  MAY VISIT WITH YOU AFTER SURGERY IN YOUR PRIVATE ROOM DURING VISITING HOURS ONLY! ?YOU MAY HAVE ONE PERSON SPEND THE NITE WITH YOU IN YOUR ROOM AFTER SURGERY.   ? ?  ? ? Your procedure is scheduled on:  ?  07/20/21  ? Report to [facility] Main  Entrance ? ? Report to admitting at      0700am           AM ?DO NOT BRING INSURANCE CARD, PICTURE ID OR WALLET DAY OF SURGERY.  ?  ? ? Call this number if you have problems the morning of surgery [phone]  ? ? REMEMBER: NO  SOLID FOODS , CANDY, GUM OR MINTS AFTER MIDNITE THE NITE BEFORE SURGERY .       [name] CLEAR LIQUIDS UNTIL      0615am            DAY OF SURGERY.       ? ? ?CLEAR LIQUID DIET ? ? ?Foods Allowed      ?WATER ?BLACK COFFEE ( SUGAR OK, NO MILK, CREAM OR CREAMER) REGULAR AND DECAF  ?TEA ( SUGAR OK NO MILK, CREAM, OR CREAMER) REGULAR AND DECAF  ?PLAIN JELLO ( NO RED)  ?FRUIT ICES ( NO RED, NO FRUIT PULP)  ?POPSICLES ( NO RED)  ?JUICE- APPLE, WHITE GRAPE AND WHITE CRANBERRY  ?SPORT DRINK LIKE GATORADE ( NO RED)  ?CLEAR BROTH ( VEGETABLE , CHICKEN OR BEEF)                                                               ? ?    ? ?BRUSH YOUR TEETH MORNING OF SURGERY AND RINSE YOUR MOUTH OUT, NO CHEWING GUM CANDY OR MINTS. ?  ? ? Take these medicines the morning of surgery with A SIP OF WATER: inhalers as usual and bring, claritin, protonix  ? ? ?DO NOT TAKE ANY DIABETIC MEDICATIONS DAY OF YOUR SURGERY ?                  ?            You may not have any metal on your body including hair pins and  ?            piercings  Do not wear jewelry, make-up, lotions, powders or perfumes, deodorant ?            Do not wear nail polish on your fingernails.   ?           IF YOU ARE A FEMALE AND WANT TO SHAVE UNDER ARMS OR LEGS PRIOR TO SURGERY YOU MUST DO SO AT LEAST 48 HOURS PRIOR TO SURGERY.  ?            Men may shave  face and neck. ? ? Do not bring valuables to the hospital. [facility] IS NOT ?            RESPONSIBLE   FOR VALUABLES. ? Contacts, dentures or bridgework may not be worn into surgery. ? Leave suitcase in the car. After surgery it may  be brought to your room. ? ?  ? Patients discharged the day of surgery will not be allowed to drive home. IF YOU ARE HAVING SURGERY AND GOING HOME THE SAME DAY, YOU MUST HAVE AN ADULT TO DRIVE YOU HOME AND BE WITH YOU FOR 24 HOURS. YOU MAY GO HOME BY TAXI OR UBER OR ORTHERWISE, BUT AN ADULT MUST ACCOMPANY YOU HOME AND STAY WITH YOU FOR 24 HOURS. ?  ? ?            Please read over the following fact sheets you were given: ?_____________________________________________________________________ ? ?[facility] - Preparing for Surgery ?Before surgery, you can play an important role.  Because skin is not sterile, your skin needs to be as free of germs as possible.  You can reduce the number of germs on your skin by washing with CHG (chlorahexidine gluconate) soap before surgery.  CHG is an antiseptic cleaner which kills germs and bonds with the skin to continue killing germs even after washing. ?Please DO NOT use if you have an allergy to CHG or antibacterial soaps.  If your skin becomes reddened/irritated stop using the CHG and inform your nurse when you arrive at Short Stay. ?Do not shave (including legs and underarms) for at least 48 hours prior to the first CHG shower.  You may shave your face/neck. ?Please follow these instructions carefully: ? 1.  Shower with CHG Soap the night before surgery and the  morning of Surgery. ? 2.  If you choose to wash your hair, wash your hair first as usual with your  normal  shampoo. ? 3.  After you shampoo, rinse your hair and body thoroughly to remove the  shampoo.                           4.  Use CHG as you would any other liquid soap.  You can apply chg directly  to the skin and wash  ?                     Gently with a scrungie or clean  washcloth. ? 5.  Apply the CHG Soap to your body ONLY FROM THE NECK DOWN.   Do not use on face/ open      ?                     Wound or open sores. Avoid contact with eyes, ears mouth and genitals (private parts).  ?                     Engineering geologist,  Genitals (private parts) with your normal soap. ?            6.  Wash thoroughly, paying special attention to the area where your surgery  will be performed. ? 7.  Thoroughly rinse your body with warm water from the neck down. ? 8.  DO NOT shower/wash with your normal soap after using and rinsing off  the CHG Soap. ?               9.  Pat yourself dry with a clean towel. ?           10.  Wear clean pajamas. ?           11.  Place clean sheets on your bed the night of your first shower and do not  sleep with pets. ?Day  of Surgery : ?Do not apply any lotions/deodorants the morning of surgery.  Please wear clean clothes to the hospital/surgery center. ? ?FAILURE TO FOLLOW THESE INSTRUCTIONS MAY RESULT IN THE CANCELLATION OF YOUR SURGERY ?PATIENT SIGNATURE_________________________________ ? ?NURSE SIGNATURE__________________________________ ? ?________________________________________________________________________  ? ? ?           ?

## 2021-07-16 NOTE — Progress Notes (Signed)
Anesthesia Review:  PCP: DR Smitty Cords burchette- :PV 06/06/21  Cardiologist : DR Chilton Si- LOV 05/15/21.  Chest x-ray : EKG : 05/15/21.  Echo : 2019  Stress test: Cardiac Cath :  Activity level:  Sleep Study/ CPAP : Fasting Blood Sugar :      / Checks Blood Sugar -- times a day:   Blood Thinner/ Instructions /Last Dose: ASA / Instructions/ Last Dose :   Diabetes Hgba1c- 06/25/21- 8.9 BMp- 06/25/21- in epic

## 2021-07-17 ENCOUNTER — Encounter (HOSPITAL_COMMUNITY)
Admission: RE | Admit: 2021-07-17 | Discharge: 2021-07-17 | Disposition: A | Discharge status: Home / Self Care | Payer: No Typology Code available for payment source | Source: Ambulatory Visit | Attending: Urology | Admitting: Urology

## 2021-07-17 ENCOUNTER — Other Ambulatory Visit: Payer: Self-pay

## 2021-07-17 ENCOUNTER — Encounter (HOSPITAL_COMMUNITY): Payer: Self-pay

## 2021-07-17 VITALS — BP 179/99 | HR 83 | Temp 98.2°F | Resp 16 | Ht 66.0 in | Wt 292.0 lb

## 2021-07-17 DIAGNOSIS — Z01818 Encounter for other preprocedural examination: Secondary | ICD-10-CM | POA: Insufficient documentation

## 2021-07-17 DIAGNOSIS — I10 Essential (primary) hypertension: Secondary | ICD-10-CM

## 2021-07-17 HISTORY — DX: Gastro-esophageal reflux disease without esophagitis: K21.9

## 2021-07-17 HISTORY — DX: Personal history of urinary calculi: Z87.442

## 2021-07-17 HISTORY — DX: Unspecified asthma, uncomplicated: J45.909

## 2021-07-17 LAB — CBC
HCT: 45 % (ref 36.0–46.0)
Hemoglobin: 14.8 g/dL (ref 12.0–15.0)
MCH: 31.5 pg (ref 26.0–34.0)
MCHC: 32.9 g/dL (ref 30.0–36.0)
MCV: 95.7 fL (ref 80.0–100.0)
Platelets: 314 10*3/uL (ref 150–400)
RBC: 4.7 MIL/uL (ref 3.87–5.11)
RDW: 12 % (ref 11.5–15.5)
WBC: 5.5 10*3/uL (ref 4.0–10.5)
nRBC: 0 % (ref 0.0–0.2)

## 2021-07-17 LAB — BASIC METABOLIC PANEL
Anion gap: 10 (ref 5–15)
BUN: 16 mg/dL (ref 6–20)
CO2: 28 mmol/L (ref 22–32)
Calcium: 8.8 mg/dL — ABNORMAL LOW (ref 8.9–10.3)
Chloride: 98 mmol/L (ref 98–111)
Creatinine, Ser: 1.05 mg/dL — ABNORMAL HIGH (ref 0.44–1.00)
GFR, Estimated: >60 mL/min (ref >60)
Glucose, Bld: 203 mg/dL — ABNORMAL HIGH (ref 70–99)
Potassium: 4.2 mmol/L (ref 3.5–5.1)
Sodium: 136 mmol/L (ref 135–145)

## 2021-07-17 LAB — GLUCOSE, CAPILLARY: Glucose-Capillary: 203 mg/dL — ABNORMAL HIGH (ref 70–99)

## 2021-07-18 ENCOUNTER — Ambulatory Visit: Payer: No Typology Code available for payment source

## 2021-07-19 NOTE — Anesthesia Preprocedure Evaluation (Addendum)
Anesthesia Evaluation  ?Patient identified by MRN, date of birth, ID band ?Patient awake ? ? ? ?Reviewed: ?Allergy & Precautions, NPO status , Patient's Chart, lab work & pertinent test results ? ?Airway ?Mallampati: II ? ?TM Distance: >3 FB ?Neck ROM: Full ? ? ? Dental ?no notable dental hx. ?(+) Teeth Intact, Dental Advisory Given ?  ?Pulmonary ?asthma , sleep apnea ,  ?  ?Pulmonary exam normal ?breath sounds clear to auscultation ? ? ? ? ? ? Cardiovascular ?hypertension, Pt. on medications ?Normal cardiovascular exam ?Rhythm:Regular Rate:Normal ? ? ?  ?Neuro/Psych ?negative neurological ROS ?   ? GI/Hepatic ?Neg liver ROS, GERD  ,  ?Endo/Other  ?diabetesMorbid obesity (BMI 47.1) ? Renal/GU ?Renal diseaseLab Results ?     Component                Value               Date                 ?     CREATININE               1.05 (H)            07/17/2021           ?     BUN                      16                  07/17/2021           ?     NA                       136                 07/17/2021           ?     K                        4.2                 07/17/2021           ?     CL                       98                  07/17/2021           ?     CO2                      28                  07/17/2021           ?  ? ?  ?Musculoskeletal ?negative musculoskeletal ROS ?(+)  ? Abdominal ?  ?Peds ? Hematology ?Lab Results ?     Component                Value               Date                 ?     WBC  5.5                 07/17/2021           ?     HGB                      14.8                07/17/2021           ?     HCT                      45.0                07/17/2021           ?     MCV                      95.7                07/17/2021           ?     PLT                      314                 07/17/2021           ?   ?Anesthesia Other Findings ?All: See list ? Reproductive/Obstetrics ? ?  ? ? ? ? ? ? ? ? ? ? ? ? ? ?  ?  ? ? ? ? ? ? ? ?Anesthesia  Physical ?Anesthesia Plan ? ?ASA: 3 ? ?Anesthesia Plan: General  ? ?Post-op Pain Management:   ? ?Induction: Intravenous ? ?PONV Risk Score and Plan: Treatment may vary due to age or medical condition and Ondansetron ? ?Airway Management Planned: LMA ? ?Additional Equipment: None ? ?Intra-op Plan:  ? ?Post-operative Plan:  ? ?Informed Consent: I have reviewed the patients History and Physical, chart, labs and discussed the procedure including the risks, benefits and alternatives for the proposed anesthesia with the patient or authorized representative who has indicated his/her understanding and acceptance.  ? ? ? ?Dental advisory given ? ?Plan Discussed with:  ? ?Anesthesia Plan Comments:   ? ? ? ? ? ?Anesthesia Quick Evaluation ? ?

## 2021-07-19 NOTE — Discharge Instructions (Addendum)
[facility] ?[phone] ?Post Ureteroscopy With or Without Stent Instructions ? ?Definitions: ? ?Ureter: The duct that transports urine from the kidney to the bladder. ?Stent:   A plastic hollow tube that is placed into the ureter, from the kidney to the bladder to prevent the ureter from swelling shut. ? ?GENERAL INSTRUCTIONS: ? ?Despite the fact that no skin incisions were used, the area around the ureter and bladder is raw and irritated. The stent is a foreign body which will further irritate the bladder wall. This irritation is manifested by increased frequency of urination, both day and night, and by an increase in the urge to urinate. In some, the urge to urinate is present almost always. Sometimes the urge is strong enough that you may not be able to stop yourself from urinating. The only real cure is to remove the stent and then give time for the bladder wall to heal which can't be done until the danger of the ureter swelling shut has passed, which varies. ? ?You may see some blood in your urine while the stent is in place and a few days afterwards. Do not be alarmed, even if the urine was clear for a while. Get off your feet and drink lots of fluids until clearing occurs. If you start to pass clots or don't improve, call us. ? ?DIET: ?You may return to your normal diet immediately. Because of the raw surface of your bladder, alcohol, spicy foods, acid type foods and drinks with caffeine may cause irritation or frequency and should be used in moderation. To keep your urine flowing freely and to avoid constipation, drink plenty of fluids during the day ( 8-10 glasses ). ?Tip: Avoid cranberry juice because it is very acidic. ? ?ACTIVITY: ?Your physical activity doesn't need to be restricted. However, if you are very active, you may see some blood in your urine. We suggest that you reduce your activity under these circumstances until the bleeding has stopped. ? ?BOWELS: ?It is important to  keep your bowels regular during the postoperative period. Straining with bowel movements can cause bleeding. A bowel movement every other day is reasonable. Use a mild laxative if needed, such as Milk of Magnesia 2-3 tablespoons, or 2 Dulcolax tablets. Call if you continue to have problems. If you have been taking narcotics for pain, before, during or after your surgery, you may be constipated. Take a laxative if necessary. ? ? ?MEDICATION: ?You should resume your pre-surgery medications unless told not to. In addition you will often be given an antibiotic to prevent infection. These should be taken as prescribed until the bottles are finished unless you are having an unusual reaction to one of the drugs. ? ?PROBLEMS YOU SHOULD REPORT TO [place]: ?Fevers over 100.5 Fahrenheit. ?Heavy bleeding, or clots ( See above notes about blood in urine ). ?Inability to urinate. ?Drug reactions ( hives, rash, nausea, vomiting, diarrhea ). ?Severe burning or pain with urination that is not improving. ? ?FOLLOW-UP: ?You will need a follow-up appointment to monitor your progress. Call for this appointment at the number listed above. Usually the first appointment will be about three to fourteen days after your surgery. ? ?You have a stent draining your kidney. Remove this by pulling on attached string on Monday AM. ? ?

## 2021-07-19 NOTE — H&P (Signed)
?  ?CC/HPI: [name] is a 61 year old African-American female seen in consultation today for a left ureteral stone and a right renal cyst.  ? ?1. Left ureteral stone:  ?-Patient was seen by her PCP in 06/2021 with complaints of an right and left-sided flank pain. CT A/P without contrast 06/25/2021 demonstrated 9 mm proximal left ureteral stone. She denies a history of urolithiasis prior. She states that currently, most of her pain is on her right side. She denies fevers or chills or dysuria. She denies gross hematuria. Urinalysis today is without sign of infection and no evidence of microscopic hematuria.  ? ?#2. Right renal cyst: CT A/P without contrast 06/25/2021 incidentally revealed a 3.8 cm posterior right hypodense cortical lesion favored to represent a renal cyst. The study was without contrast. She does report intermittent right-sided back pain. This is reproducible on exam and is favored to be musculoskeletal. There is no mass effect from the small renal lesion on CT scan.  ? ?Patient currently denies fever, chills, sweats, nausea, vomiting, abdominal or flank pain, gross hematuria or dysuria.  ? ?She does have a past medical history asthma, obstructive sleep apnea, hypertension, obesity.  ? ?  ?ALLERGIES: Ibuprofen TABS ?  ? ?MEDICATIONS: Allergy & Sinus Relief  ?Apple Cider Vinegar  ?Bee Pollen  ?Dymista  ?Flax Seed Oil  ?Guaifenesin 400 mg tablet  ?Lemon Juice  ?Multivitamin  ?Spironolactone 25 mg tablet  ?Women's Multivitamin Collagen  ?  ? ?GU PSH: Hysterectomy Unilat SO - 2011 ? ?  ?   ?PSH Notes: Hysterectomy  ? ?NON-GU PSH: None  ? ?GU PMH: Urinary Tract Inf, Unspec site, Urinary tract infection - 2014 ?  ?   ?PMH Notes:  ?1898-05-06 00:00:00 - Note: Normal Routine History And Physical Adult  ? ?NON-GU PMH: Personal history of other diseases of the circulatory system, History of hypertension - 2014 ?Personal history of other specified conditions, History of heartburn -  2014 ?Asthma ?GERD ?Sleep Apnea ?  ? ?FAMILY HISTORY: 1 Daughter - Runs in Family ?1 son - Runs in Family ?Family Health Status Number - Runs In Family ?Father Deceased At Age28 ___ - Runs In Family ?Mother Deceased At Age 39 from diabetic complicati - Runs In Family  ? ?SOCIAL HISTORY: Marital Status: Married ?Ethnicity: Not Hispanic Or Latino; Race: Black or African American ?Current Smoking Status: Patient has never smoked.  ? ?Tobacco Use Assessment Completed: Used Tobacco in last 30 days? ?Has never drank.  ?Drinks 2 caffeinated drinks per day. ?  ?  Notes: Alcohol Use, Tobacco Use, Marital History - Currently Married  ? ?REVIEW OF SYSTEMS:    ?GU Review Female:   Patient reports hard to postpone urination, get up at night to urinate, and leakage of urine. Patient denies frequent urination, burning /pain with urination, stream starts and stops, trouble starting your stream, have to strain to urinate, and being pregnant.  ?Gastrointestinal (Upper):   Patient reports nausea. Patient denies vomiting and indigestion/ heartburn.  ?Gastrointestinal (Lower):   Patient denies diarrhea and constipation.  ?Constitutional:   Patient reports fatigue. Patient denies night sweats, fever, and weight loss.  ?Skin:   Patient denies skin rash/ lesion and itching.  ?Eyes:   Patient denies blurred vision and double vision.  ?Ears/ Nose/ Throat:   Patient reports sinus problems. Patient denies sore throat.  ?Hematologic/Lymphatic:   Patient denies swollen glands and easy bruising.  ?Cardiovascular:   Patient denies leg swelling and chest pains.  ?Respiratory:   Patient denies  cough and shortness of breath.  ?Endocrine:   Patient denies excessive thirst.  ?Musculoskeletal:   Patient reports back pain. Patient denies joint pain.  ?Neurological:   Patient denies headaches and dizziness.  ?Psychologic:   Patient denies depression and anxiety.  ? ?VITAL SIGNS:    ?  07/05/2021 01:57 PM  ?Weight 301 lb / 136.53 kg  ?Height 67 in / 170.18  cm  ?BP 186/95 mmHg  ?Pulse 80 /min  ?Temperature 98.0 F / 36.6 C  ?BMI 47.1 kg/m?  ? ?MULTI-SYSTEM PHYSICAL EXAMINATION:    ?Constitutional: Well-nourished. No physical deformities. Normally developed. Good grooming.  ?Respiratory: No labored breathing, no use of accessory muscles.   ?Cardiovascular: Normal temperature, normal extremity pulses, no swelling, no varicosities.  ?Gastrointestinal: Obese abdomen. No mass, no tenderness, no rigidity. No CVA tenderness. Paramidline spinal tenderness, reproducible on exam.  ? ?  ?Complexity of Data:  ?Source Of History:  Patient, Medical Record Summary  ?Records Review:   Previous Doctor Records  ?Urine Test Review:   Urinalysis  ?X-Ray Review: C.T. Abdomen/Pelvis: Reviewed Films. Reviewed Report. Discussed With Radiologist.  ?  ?Notes:                     CLINICAL DATA: Flank pain, concern for nephrolithiasis  ? ?EXAM:  ?CT ABDOMEN AND PELVIS WITHOUT CONTRAST  ? ?TECHNIQUE:  ?Multidetector CT imaging of the abdomen and pelvis was performed  ?following the standard protocol without IV contrast.  ? ?RADIATION DOSE REDUCTION: This exam was performed according to the  ?departmental dose-optimization program which includes automated  ?exposure control, adjustment of the mA and/or kV according to  ?patient size and/or use of iterative reconstruction technique.  ? ?COMPARISON: 10/25/2004 MRI abdomen only  ? ?FINDINGS:  ?Lower chest: No acute abnormality.  ? ?Hepatobiliary: Limited without IV contrast. No large focal hepatic  ?abnormality. No biliary dilatation or obstruction pattern.  ?Gallbladder nondistended. Common bile duct nondilated.  ? ?Pancreas: Unremarkable. No pancreatic ductal dilatation or  ?surrounding inflammatory changes.  ? ?Spleen: Normal in size without focal abnormality.  ? ?Adrenals/Urinary Tract: Normal adrenal glands.  ? ?Left kidney demonstrates very minimal hydronephrosis and proximal  ?hydroureter secondary to a mildly obstructing proximal left ureteral   ?9 mm calculus, image 42/2.  ? ?Right kidney demonstrates no acute obstructing urinary tract or  ?ureteral calculus. No hydronephrosis or acute obstructive uropathy.  ?Right posterior midpole hypodense lesion noted measuring 3.8 cm,  ?favored to be a renal cyst.  ? ?Urinary bladder collapsed.  ? ?Stomach/Bowel: Negative for bowel obstruction, significant  ?dilatation, ileus, free air. Normal appendix visualized. Scattered  ?colonic diverticulosis. No acute inflammatory process.  ? ?No free fluid, fluid collection, hemorrhage, hematoma, abscess or  ?ascites.  ? ?Vascular/Lymphatic: Limited without IV contrast. Negative for  ?aneurysm. No adenopathy.  ? ?Reproductive: Status post hysterectomy. No adnexal masses.  ? ?Other: Small midline fat containing umbilical hernia, hernia defect  ?measures 11 mm on image 32/2. Lower abdominal wall diastasis noted.  ?No inguinal abnormality. No abdominopelvic ascites.  ? ?Musculoskeletal: Degenerative changes of the spine. Lower lumbar  ?facet arthropathy. No acute osseous finding.  ? ?IMPRESSION:  ?9 mm minimally obstructing proximal left ureteral calculus as above.  ? ?3.8 cm right kidney posterior hypodense cortical lesion favored to  ?represent a renal cyst. Consider correlation with renal ultrasound  ?for confirmation.  ? ?Small midline fat containing abdominal wall ventral hernia.  ? ?Minor scattered colonic diverticulosis without acute inflammatory  ?process.  ? ?  Remote hysterectomy  ? ?These results will be called to the ordering clinician or  ?representative by the Radiologist Assistant, and communication  ?documented in the PACS or [employer].  ? ? ?Electronically Signed  ?By: Judie Petit. Shick M.D.  ?On: 06/25/2021 12:48  ? ?PROCEDURES:    ? ?     Urinalysis w/Scope ?Dipstick Dipstick Cont'd Micro  ?Color: Yellow Bilirubin: Neg mg/dL WBC/hpf: 6 - 89/QJJ  ?Appearance: Cloudy Ketones: Neg mg/dL RBC/hpf: 0 - 2/hpf  ?Specific Gravity: 1.015 Blood: Neg ery/uL Bacteria: Few  (10-25/hpf)  ?pH: 5.5 Protein: Trace mg/dL Cystals: NS (Not Seen)  ?Glucose: Neg mg/dL Urobilinogen: 0.2 mg/dL Casts: NS (Not Seen)  ?  Nitrites: Neg Trichomonas: Not Present  ?  Leukocyte Esterase: 1+ leu/uL Mucous: Not Prese

## 2021-07-20 ENCOUNTER — Ambulatory Visit (HOSPITAL_COMMUNITY): Payer: No Typology Code available for payment source | Admitting: Physician Assistant

## 2021-07-20 ENCOUNTER — Ambulatory Visit (HOSPITAL_COMMUNITY)
Admission: RE | Admit: 2021-07-20 | Discharge: 2021-07-20 | Disposition: A | Discharge status: Home / Self Care | Payer: No Typology Code available for payment source | Source: Ambulatory Visit | Attending: Urology | Admitting: Urology

## 2021-07-20 ENCOUNTER — Ambulatory Visit (HOSPITAL_COMMUNITY): Payer: No Typology Code available for payment source

## 2021-07-20 ENCOUNTER — Encounter (HOSPITAL_COMMUNITY)
Admission: RE | Disposition: A | Discharge status: Home / Self Care | Payer: Self-pay | Source: Ambulatory Visit | Attending: Urology

## 2021-07-20 ENCOUNTER — Ambulatory Visit (HOSPITAL_BASED_OUTPATIENT_CLINIC_OR_DEPARTMENT_OTHER): Payer: No Typology Code available for payment source | Admitting: Anesthesiology

## 2021-07-20 ENCOUNTER — Encounter (HOSPITAL_COMMUNITY): Payer: Self-pay | Admitting: Urology

## 2021-07-20 DIAGNOSIS — E119 Type 2 diabetes mellitus without complications: Secondary | ICD-10-CM | POA: Insufficient documentation

## 2021-07-20 DIAGNOSIS — Z6841 Body Mass Index (BMI) 40.0 and over, adult: Secondary | ICD-10-CM | POA: Insufficient documentation

## 2021-07-20 DIAGNOSIS — J45909 Unspecified asthma, uncomplicated: Secondary | ICD-10-CM | POA: Diagnosis not present

## 2021-07-20 DIAGNOSIS — N201 Calculus of ureter: Secondary | ICD-10-CM

## 2021-07-20 DIAGNOSIS — N281 Cyst of kidney, acquired: Secondary | ICD-10-CM | POA: Diagnosis not present

## 2021-07-20 DIAGNOSIS — I10 Essential (primary) hypertension: Secondary | ICD-10-CM | POA: Diagnosis not present

## 2021-07-20 DIAGNOSIS — G4733 Obstructive sleep apnea (adult) (pediatric): Secondary | ICD-10-CM | POA: Diagnosis not present

## 2021-07-20 HISTORY — PX: CYSTOSCOPY WITH RETROGRADE PYELOGRAM, URETEROSCOPY AND STENT PLACEMENT: SHX5789

## 2021-07-20 LAB — GLUCOSE, CAPILLARY: Glucose-Capillary: 203 mg/dL — ABNORMAL HIGH (ref 70–99)

## 2021-07-20 SURGERY — CYSTOURETEROSCOPY, WITH RETROGRADE PYELOGRAM AND STENT INSERTION
Anesthesia: General | Laterality: Left

## 2021-07-20 MED ORDER — LIDOCAINE HCL (CARDIAC) PF 100 MG/5ML IV SOSY
PREFILLED_SYRINGE | INTRAVENOUS | Status: DC | PRN
  Administered 2021-07-20: 100 mg via INTRAVENOUS

## 2021-07-20 MED ORDER — ACETAMINOPHEN 10 MG/ML IV SOLN: 1000.0000 mg | Freq: Once | INTRAVENOUS | Status: DC | PRN

## 2021-07-20 MED ORDER — ONDANSETRON HCL 4 MG/2ML IJ SOLN
INTRAMUSCULAR | Status: AC
  Filled 2021-07-20: qty 2

## 2021-07-20 MED ORDER — OXYCODONE-ACETAMINOPHEN 5-325 MG PO TABS: 1.0000 | ORAL_TABLET | ORAL | 0 refills | Status: DC | PRN

## 2021-07-20 MED ORDER — OXYCODONE HCL 5 MG PO TABS: 5.0000 mg | ORAL_TABLET | Freq: Once | ORAL | Status: DC | PRN

## 2021-07-20 MED ORDER — MIDAZOLAM HCL 5 MG/5ML IJ SOLN
INTRAMUSCULAR | Status: DC | PRN
  Administered 2021-07-20: 2 mg via INTRAVENOUS

## 2021-07-20 MED ORDER — IOHEXOL 300 MG/ML  SOLN
INTRAMUSCULAR | Status: DC | PRN
  Administered 2021-07-20: 10 mL via URETHRAL

## 2021-07-20 MED ORDER — DEXAMETHASONE SODIUM PHOSPHATE 10 MG/ML IJ SOLN
INTRAMUSCULAR | Status: AC
  Filled 2021-07-20: qty 1

## 2021-07-20 MED ORDER — LABETALOL HCL 5 MG/ML IV SOLN
INTRAVENOUS | Status: DC | PRN
  Administered 2021-07-20: 5 mg via INTRAVENOUS

## 2021-07-20 MED ORDER — ONDANSETRON HCL 4 MG/2ML IJ SOLN
4.0000 mg | Freq: Once | INTRAMUSCULAR | Status: AC | PRN
  Administered 2021-07-20: 4 mg via INTRAVENOUS

## 2021-07-20 MED ORDER — DOCUSATE SODIUM 100 MG PO CAPS: 100.0000 mg | ORAL_CAPSULE | Freq: Every day | ORAL | 0 refills | Status: AC | PRN

## 2021-07-20 MED ORDER — CEFAZOLIN IN SODIUM CHLORIDE 3-0.9 GM/100ML-% IV SOLN
3.0000 g | INTRAVENOUS | Status: AC
Start: 2021-07-20 — End: 2021-07-20
  Administered 2021-07-20: 3 g via INTRAVENOUS
  Filled 2021-07-20: qty 100

## 2021-07-20 MED ORDER — LABETALOL HCL 5 MG/ML IV SOLN
5.0000 mg | Freq: Once | INTRAVENOUS | Status: AC
  Administered 2021-07-20: 5 mg via INTRAVENOUS
  Filled 2021-07-20: qty 4

## 2021-07-20 MED ORDER — LACTATED RINGERS IV SOLN: INTRAVENOUS | Status: DC

## 2021-07-20 MED ORDER — PHENYLEPHRINE 40 MCG/ML (10ML) SYRINGE FOR IV PUSH (FOR BLOOD PRESSURE SUPPORT)
PREFILLED_SYRINGE | INTRAVENOUS | Status: DC | PRN
  Administered 2021-07-20: 80 ug via INTRAVENOUS

## 2021-07-20 MED ORDER — ONDANSETRON HCL 4 MG/2ML IJ SOLN
INTRAMUSCULAR | Status: DC | PRN
  Administered 2021-07-20: 4 mg via INTRAVENOUS

## 2021-07-20 MED ORDER — SODIUM CHLORIDE 0.9 % IR SOLN
Status: DC | PRN
Start: 2021-07-20 — End: 2021-07-20
  Administered 2021-07-20: 3000 mL

## 2021-07-20 MED ORDER — FENTANYL CITRATE (PF) 100 MCG/2ML IJ SOLN
INTRAMUSCULAR | Status: DC | PRN
  Administered 2021-07-20: 100 ug via INTRAVENOUS

## 2021-07-20 MED ORDER — CEFAZOLIN IN SODIUM CHLORIDE 3-0.9 GM/100ML-% IV SOLN
INTRAVENOUS | Status: AC
  Filled 2021-07-20: qty 100

## 2021-07-20 MED ORDER — ORAL CARE MOUTH RINSE
15.0000 mL | Freq: Once | OROMUCOSAL | Status: AC
  Administered 2021-07-20: 15 mL via OROMUCOSAL

## 2021-07-20 MED ORDER — FENTANYL CITRATE (PF) 100 MCG/2ML IJ SOLN
INTRAMUSCULAR | Status: AC
  Filled 2021-07-20: qty 2

## 2021-07-20 MED ORDER — MIDAZOLAM HCL 2 MG/2ML IJ SOLN
INTRAMUSCULAR | Status: AC
  Filled 2021-07-20: qty 2

## 2021-07-20 MED ORDER — PROPOFOL 10 MG/ML IV BOLUS
INTRAVENOUS | Status: AC
  Filled 2021-07-20: qty 20

## 2021-07-20 MED ORDER — CHLORHEXIDINE GLUCONATE 0.12 % MT SOLN: 15.0000 mL | Freq: Once | OROMUCOSAL | Status: AC

## 2021-07-20 MED ORDER — OXYCODONE HCL 5 MG/5ML PO SOLN: 5.0000 mg | Freq: Once | ORAL | Status: DC | PRN

## 2021-07-20 MED ORDER — HYDROMORPHONE HCL 1 MG/ML IJ SOLN: 0.2500 mg | INTRAMUSCULAR | Status: DC | PRN

## 2021-07-20 MED ORDER — PROPOFOL 10 MG/ML IV BOLUS
INTRAVENOUS | Status: DC | PRN
  Administered 2021-07-20: 150 mg via INTRAVENOUS

## 2021-07-20 MED ORDER — LIDOCAINE HCL (PF) 2 % IJ SOLN
INTRAMUSCULAR | Status: AC
  Filled 2021-07-20: qty 5

## 2021-07-20 MED ORDER — CEPHALEXIN 500 MG PO CAPS: 500.0000 mg | ORAL_CAPSULE | Freq: Two times a day (BID) | ORAL | 0 refills | Status: AC

## 2021-07-20 SURGICAL SUPPLY — 22 items
BAG URO CATCHER STRL LF (MISCELLANEOUS) ×3 IMPLANT
BASKET ZERO TIP NITINOL 2.4FR (BASKET) ×1 IMPLANT
BSKT STON RTRVL ZERO TP 2.4FR (BASKET) ×1
CATH URETL OPEN END 6FR 70 (CATHETERS) IMPLANT
CLOTH BEACON ORANGE TIMEOUT ST (SAFETY) ×3 IMPLANT
EXTRACTOR STONE NITINOL NGAGE (UROLOGICAL SUPPLIES) IMPLANT
GLOVE SURG ENC TEXT LTX SZ7 (GLOVE) ×3 IMPLANT
GUIDEWIRE ANG ZIPWIRE 038X150 (WIRE) IMPLANT
GUIDEWIRE STR DUAL SENSOR (WIRE) ×6 IMPLANT
IV NS 1000ML (IV SOLUTION) ×2
IV NS 1000ML BAXH (IV SOLUTION) ×2 IMPLANT
LASER FIB FLEXIVA PULSE ID 365 (Laser) IMPLANT
MANIFOLD NEPTUNE II (INSTRUMENTS) ×3 IMPLANT
PACK CYSTO (CUSTOM PROCEDURE TRAY) ×3 IMPLANT
SHEATH DILATOR SET 8/10 (MISCELLANEOUS) ×1 IMPLANT
SHEATH NAVIGATOR HD 11/13X28 (SHEATH) IMPLANT
SHEATH NAVIGATOR HD 11/13X36 (SHEATH) ×1 IMPLANT
SHEATH NAVIGATOR HD 12/14X46 (SHEATH) IMPLANT
TRACTIP FLEXIVA PULS ID 200XHI (Laser) IMPLANT
TRACTIP FLEXIVA PULSE ID 200 (Laser) ×1 IMPLANT
TUBING CONNECTING 10 (TUBING) ×3 IMPLANT
TUBING UROLOGY SET (TUBING) ×3 IMPLANT

## 2021-07-20 NOTE — Op Note (Signed)
Operative Note ? ?Preoperative diagnosis:  ?1.  Left ureteral stone ? ?Postoperative diagnosis: ?1.  Left ureteral stone ? ?Procedure(s): ?1.  Cystoscopy ?2. Left ureteroscopy with laser lithotripsy and basket extraction of stones ?3. Left retrograde pyelogram ?4. Left ureteral stent placement ?5. Fluoroscopy with intraoperative interpretation ? ?Surgeon: Jettie Pagan, MD ? ?Assistants:  None ? ?Anesthesia:  General ? ?Complications:  None ? ?EBL:  Minimal ? ?Specimens: ?1. Stones for stone analysis (to be done at [facility]) ? ?Drains/Catheters: ?1.  Left 6Fr x 24cm ureteral stent WITH a tether string ? ?Intraoperative findings:   ?Cystoscopy demonstrated no suspicious bladder lesions. ?Left ureteroscopy demonstrated 60mm stone in the proximal left ureter. ?Successful left ureteral stent placement. ? ?Indication:  CATHA ONTKO is a 61 y.o. female with a history of a 32mm left ureteral stone here for definitive stone management. ? ?Description of procedure: ?After informed consent was obtained from the patient, the patient was identified and taken to the operating room and placed in the supine position.  General anesthesia was administered as well as perioperative IV antibiotics.  At the beginning of the case, a time-out was performed to properly identify the patient, the surgery to be performed, and the surgical site.  Sequential compression devices were applied to the lower extremities at the beginning of the case for DVT prophylaxis.  The patient was then placed in the dorsal lithotomy supine position, prepped and draped in sterile fashion. ? ?Preliminary scout fluoroscopy revealed that there was a 96mm calcification area at the left proximal ureter, which corresponds to the stone found on the preoperative CT scan. We then passed the 21-French rigid cystoscope through the urethra and into the bladder under vision without any difficulty. A systematic evaluation of the bladder revealed no evidence of any  suspicious bladder lesions.  Ureteral orifices were in normal position.   ? ?Under cystoscopic and flouroscopic guidance, we cannulated the left ureteral orifice with a 5-French open-ended ureteral catheter and a gentle retrograde pyelogram was performed, revealing a normal caliber ureter without any filling defects. There was mild left hydronephrosis of the collecting system. A 0.038 sensor wire was then passed up to the level of the renal pelvis and secured to the drape as a safety wire. The ureteral catheter and cystoscope were removed, leaving the safety wire in place.  ? ?A semi-rigid ureteroscope was passed alongside the wire up the distal ureter which appeared normal. A second 0.038 sensor wire was passed under direct vision and the semirigid scope was removed. An 8/10 Fr dilator and subsequently a 11/13Fr ureteral access sheath was carefully advanced up the ureter to the level of the UPJ over this wire under fluoroscopic guidance. The flexible ureteroscope was advanced into the collecting system via the access sheath. The collecting system was inspected. The calculus was identified at the left renal pelvis and was placed into an upper pole calyx. Using the 200 micron holmium laser fiber, the stone was fragmented completely. A 2.2 Fr zero tip basket was used to remove the fragments under visual guidance. These were sent for chemical analysis. With the ureteroscope in the kidney, a gentle pyelogram was performed to delineate the calyceal system and we evaluated the calyces systematically. We encountered a no further large stone fragments. The rest of the stone fragments were very tiny and these were  irrigated away gently. The calyces were re-inspected and there were no significant stone fragment residual.  ? ?We then withdrew the ureteroscope back down the ureter along  with the access sheath, noting no evidence of any stones along the course of the ureter.  Prior to removing the ureteroscope, we did pass the  Glidewire back up to the ureter to the renal pelvis.  ? ?Once the ureteroscope was removed, the Glidewire was backloaded through the rigid cystoscope, which was then advanced down the urethra and into the bladder. We then used the Glidewire under direct vision through the rigid cystoscope and under fluoroscopic guidance and passed up a 6-French, 26 cm double-pigtail ureteral stent up ureter, making sure that the proximal and distal ends coiled within the kidney and bladder respectively. ? ?Note that we left a long tether string attached to the distal end of the ureteral stent and it exited the urethral meatus and was secured to the penile inner thigh with a tegaderm adhesive.  The cystoscope was then advanced back into the bladder under vision.  We were able to see the distal stent coiling nicely within the bladder.  The bladder was then emptied with irrigation solution.  The cystoscope was then removed.   ? ?The patient tolerated the procedure well and there was no complication. Patient was awoken from anesthesia and taken to the recovery room in stable condition. I was present and scrubbed for the entirety of the case. ? ?Plan:  Patient will be discharged home.  She will remove her stent in 3 days.  She will follow-up with me as scheduled. ? ? ?Matt R. Nkosi Cortright MD ?[facility]  ?Pager: [phone] ? ?

## 2021-07-20 NOTE — Anesthesia Postprocedure Evaluation (Signed)
Anesthesia Post Note ? ?Patient: [name] ? ?Procedure(s) Performed: CYSTOSCOPY WITH RETROGRADE PYELOGRAM, URETEROSCOPY HOLMIUM LASER LITOTRIPSY AND STENT PLACEMENT (Left) ? ?  ? ?Patient location during evaluation: PACU ?Anesthesia Type: General ?Level of consciousness: awake and alert ?Pain management: pain level controlled ?Vital Signs Assessment: post-procedure vital signs reviewed and stable ?Respiratory status: spontaneous breathing, nonlabored ventilation, respiratory function stable and patient connected to nasal cannula oxygen ?Cardiovascular status: blood pressure returned to baseline and stable ?Postop Assessment: no apparent nausea or vomiting ?Anesthetic complications: no ? ? ?No notable events documented. ? ?Last Vitals:  ?Vitals:  ? 07/20/21 1045 07/20/21 1100  ?BP: (!) 202/109   ?Pulse: 74   ?Resp: (!) 22   ?Temp:  36.7 ?C  ?SpO2: 100% 93%  ?  ?Last Pain:  ?Vitals:  ? 07/20/21 0740  ?TempSrc: Oral  ? ? ?  ?  ?  ?  ?  ?  ? ?Barnet Glasgow ? ? ? ? ?

## 2021-07-20 NOTE — Anesthesia Procedure Notes (Signed)
Procedure Name: Intubation ?Date/Time: 07/20/2021 9:02 AM ?Performed by: Doran Clay, CRNA ?Pre-anesthesia Checklist: Patient identified, Emergency Drugs available, Suction available and Patient being monitored ?Patient Re-evaluated:Patient Re-evaluated prior to induction ?Oxygen Delivery Method: Circle system utilized ?Preoxygenation: Pre-oxygenation with 100% oxygen ?Induction Type: IV induction ?LMA: LMA with gastric port inserted ?LMA Size: 4.0 ?Tube type: Oral ?Number of attempts: 1 ?Placement Confirmation: positive ETCO2 and breath sounds checked- equal and bilateral ?Tube secured with: Tape ?Dental Injury: Teeth and Oropharynx as per pre-operative assessment  ? ? ? ? ?

## 2021-07-20 NOTE — Transfer of Care (Signed)
Immediate Anesthesia Transfer of Care Note ? ?Patient: [name] ? ?Procedure(s) Performed: CYSTOSCOPY WITH RETROGRADE PYELOGRAM, URETEROSCOPY HOLMIUM LASER LITOTRIPSY AND STENT PLACEMENT (Left) ? ?Patient Location: PACU ? ?Anesthesia Type:General ? ?Level of Consciousness: sedated ? ?Airway & Oxygen Therapy: Patient Spontanous Breathing and Patient connected to face mask oxygen ? ?Post-op Assessment: Report given to RN and Post -op Vital signs reviewed and stable ? ?Post vital signs: Reviewed and stable ? ?Last Vitals:  ?Vitals Value Taken Time  ?BP    ?Temp    ?Pulse    ?Resp    ?SpO2    ? ? ?Last Pain:  ?Vitals:  ? 07/20/21 0740  ?TempSrc: Oral  ?   ? ?  ? ?Complications: No notable events documented. ?

## 2021-07-21 ENCOUNTER — Encounter (HOSPITAL_COMMUNITY): Payer: Self-pay | Admitting: Urology

## 2021-08-02 ENCOUNTER — Ambulatory Visit
Payer: No Typology Code available for payment source | Admitting: Pharmacist Clinician (PhC)/ Clinical Pharmacy Specialist

## 2021-08-02 ENCOUNTER — Telehealth: Payer: Self-pay | Admitting: Pharmacist Clinician (PhC)/ Clinical Pharmacy Specialist

## 2021-08-02 VITALS — BP 152/86 | HR 89 | Resp 15 | Ht 66.25 in | Wt 297.0 lb

## 2021-08-02 DIAGNOSIS — I10 Essential (primary) hypertension: Secondary | ICD-10-CM

## 2021-08-02 DIAGNOSIS — E1169 Type 2 diabetes mellitus with other specified complication: Secondary | ICD-10-CM

## 2021-08-02 DIAGNOSIS — E669 Obesity, unspecified: Secondary | ICD-10-CM

## 2021-08-02 NOTE — Assessment & Plan Note (Signed)
Patient was given Ozempic by PCP, but unsure of how to use pen.  Reviewed information on injection technique and pen use with our sample pen in the office.  Explained dosing regimen, but encouraged her to take to her new PCP today and have them give her more guidance.   ?

## 2021-08-02 NOTE — Assessment & Plan Note (Signed)
Patient with hard to treat hypertension, secondary to multiple medication sensitivities.  In reviewing Epic notes, she does not appear to have taken any ACEI or hydralazine in the past.  (hydralazine was mentioned, but not in med history).  Because she took double dose of spironolactone for the past 2 weeks, will check BMET today to confirm that renal function and potassium levels stable.  If they are, will consider leaving her at spironolactone 100 mg as after 10 days on this, she had a good blood pressure reading at home this morning.  Can also consider using hydralazine if don't want to push spiro.  Would consider starting with 10 mg bid, which won't have a big impact to start, but would rather titrate up slowly, as she has tolerability issues.  I will call her once labs are reviewed (probably Monday) to determine which of these options will be best.  Will schedule her for 4-6 week follow up at that time ?

## 2021-08-02 NOTE — Progress Notes (Signed)
? ? ? ?08/02/2021 ?Diamantina Providence ?04/25/1961 ?[id number] ? ? ?HPI:  [name] is a 61 y.o. female patient of Dr Oval Linsey, with a [place] below who presents today for hypertension clinic follow up.  She was referred by Dr. Regis Bill and first saw Dr. Wyman Songster in January.   At that visit her pressure was 196/92, and she had been having trouble with sensitivities to multiple mediations.  (See list below - no indication she has tried ACEI or hydralazine).  Dr. Oval Linsey had her re-challenge with olmesartan 40 mg and added spironolactone 25 mg.  I saw her in follow up a month later.  Pressure was slightly improved to 182/96.  She had discontinued olmesartan due to pain and was only on spironolactone, so we increased that to 50 mg daily.  She was dealing with left side kidney stone and possible right renal cyst.   ? ?Today she returns for follow up.  While her kidney stone issue was resolved, she continues to have right flank pain, and is working with urology on this.  She does note that she took 2 of the 25 mg spironolactone tablets until they were gone, then picked up the 50 mg tablets and continued to take 2 daily.  So for the past 2 weeks she has actually had 100 mg daily.  Home BP reading this morning was the first readings < 951 systolic in a long time.   ? ?Past Medical History: ?hyperliidemia 1/23 LDL 112 no medications  ?DM2 9/21 A1c 9.0 - no current meds, will start Ozempic in next week  ?  ? ?Blood Pressure Goal:  130/80 ? ?Current Medications: spironolactone 100 mg (should be on 50 mg) ? ?Family History: cancer and HTN in mother, unknown history father, HTN and stroke in maternal grandmother ?  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 ?Diet:   Breakfast: oatmeal with banana; smoothie with blue berries, water or protein scoop, flax seed, bee pollen, peanut butter;  Lunch: salad, no meat with nuts;  Dinner: salads for dinner  with meat (chicken from [employer]); olive oil or [place];  Snacks: drinks coffee in the morning three times per week; yogurt cup or granola ? ?Exercise: no regular exercise ? ?Home BP readings: 4 reading since surgery ? 155/90   188/98    177/89    114/77 ? ?Intolerances:  ? Olmesartan - felt "like insides were dying" ?Amlodipine - diarrhea ?Candesartan, telmisartan- drowsiness, diarrhea ?HCTZ - hypokalemia ?Feldodipine - lower extremity edema ?Carvedilol - swelling ? ? ?Labs: 07/17/21: Na 136, K 4.2, Glu 203, BUN 16, SCr 1.05, GFR > 60 ? ? ?Wt Readings from Last 3 Encounters:  ?08/02/21 297 lb (134.7 kg)  ?07/17/21 292 lb (132.5 kg)  ?06/25/21 (!) 300 lb 3.2 oz (136.2 kg)  ? ?BP Readings from Last 3 Encounters:  ?08/02/21 (!) 152/86  ?07/20/21 (!) 197/100  ?07/17/21 (!) 179/99  ? ?Pulse Readings from Last 3 Encounters:  ?08/02/21 89  ?07/20/21 85  ?07/17/21 83  ? ? ?Current Outpatient Medications  ?Medication Sig Dispense Refill  ? albuterol (VENTOLIN HFA) 108 (90 Base) MCG/ACT inhaler INHALE 1-2 PUFFS INTO THE LUNGS 2 (TWO) TIMES DAILY AS NEEDED FOR WHEEZING OR SHORTNESS OF BREATH. 6.7 each 0  ? Azelastine-Fluticasone (DYMISTA) 137-50 MCG/ACT SUSP Place 2 sprays into the nose daily. (Patient taking differently: Place 2 sprays into the nose daily as needed (allergies).) 23 g 0  ?  BEE POLLEN PO Take by mouth.    ? BREO ELLIPTA 200-25 MCG/INH AEPB TAKE 1 PUFF BY MOUTH EVERY DAY (Patient taking differently: Inhale 1 puff into the lungs daily as needed (wheezing or shortness of breath).) 60 each 5  ? docusate sodium (COLACE) 100 MG capsule Take 1 capsule (100 mg total) by mouth daily as needed for up to 30 doses. 30 capsule 0  ? Flaxseed, Linseed, (FLAX SEEDS) POWD Take 1 capsule by mouth daily.    ? glucose blood (ACCU-CHEK GUIDE) test strip Use as instructed to check sugar 3 times daily 300 each 1  ? guaifenesin (HUMIBID E) 400 MG TABS tablet Take 400 mg by mouth daily.    ? Lancets Misc. (ACCU-CHEK MULTICLIX  LANCET DEV) KIT Use as instructed to check sugar 3 times daily 1 each prn  ? Multiple Vitamin (MULTIVITAMIN WITH MINERALS) TABS tablet Take 1 tablet by mouth at bedtime. One a Day 50 plus    ? OVER THE COUNTER MEDICATION Equate Chlorpheniramine maleate 4 mg.  PT reports takes in the am, in the afternoon and in the pm.    ? spironolactone (ALDACTONE) 50 MG tablet Take 1 tablet (50 mg total) by mouth daily. 90 tablet 3  ? pantoprazole (PROTONIX) 40 MG tablet TAKE 1 TABLET BY MOUTH EVERY DAY (Patient not taking: Reported on 07/16/2021) 90 tablet 0  ? Semaglutide,0.25 or 0.5MG/DOS, (OZEMPIC, 0.25 OR 0.5 MG/DOSE,) 2 MG/1.5ML SOPN Inject 0.25 mg into the skin once a week. (Patient not taking: Reported on 08/02/2021) 1.5 mL 1  ? ?No current facility-administered medications for this visit.  ? ? ?Allergies  ?Allergen Reactions  ? Metformin And Related Diarrhea  ?  Diarrhea  intolerance at lowest dose  ? Olmesartan Other (See Comments)  ?  Felt like insides dying   tried for 3 weeks see text  ? Amlodipine Diarrhea  ? Candesartan Cilexetil Other (See Comments)  ?  Reaction to Atacand - possibly drowsiness or diarrhea or nausea  ? Codeine Hives, Itching and Swelling  ?  Possible throat swelling - pt does not recall this reaction  ? Wilder Glade [Dapagliflozin] Other (See Comments)  ?  Memory issues  ? Hydrochlorothiazide   ?  hypokalemia  ? Ibuprofen Hives  ? Telmisartan Other (See Comments)  ?  Reaction to Micardis - possibly drowsiness or diarrhea or nausea  ? Zithromax [Azithromycin] Diarrhea  ?   ?  ? ? ?Past Medical History:  ?Diagnosis Date  ? Allergic rhinitis   ? Asthma   ? Diabetes mellitus   ? GERD (gastroesophageal reflux disease)   ? History of kidney stones   ? Hyperlipidemia   ? Hypertension   ? x many years (since birht of her son at 66) Had workup with renal artery angiogram that showed no evidence for fibromuscular dysplasia ( no significant renal artery stenosis.) ECHO (8/11) EF 60-65%, mild LVH, no regional WMAs.   ? Low back pain   ? Morbid obesity ([place])   ? OSA (obstructive sleep apnea) 12/28/2015  ? ? ?Blood pressure (!) 152/86, pulse 89, resp. rate 15, height 5' 6.25" (1.683 m), weight 297 lb (134.7 kg), SpO2 98 %. ? ?Essential hypertension ?Patient with hard to treat hypertension, secondary to multiple medication sensitivities.  In reviewing Epic notes, she does not appear to have taken any ACEI or hydralazine in the past.  (hydralazine was mentioned, but not in med history).  Because she took double dose of spironolactone for the past 2 weeks,  will check BMET today to confirm that renal function and potassium levels stable.  If they are, will consider leaving her at spironolactone 100 mg as after 10 days on this, she had a good blood pressure reading at home this morning.  Can also consider using hydralazine if don't want to push spiro.  Would consider starting with 10 mg bid, which won't have a big impact to start, but would rather titrate up slowly, as she has tolerability issues.  I will call her once labs are reviewed (probably Monday) to determine which of these options will be best.  Will schedule her for 4-6 week follow up at that time ? ?Diabetes mellitus type 2 in obese [facility]) ?Patient was given Ozempic by PCP, but unsure of how to use pen.  Reviewed information on injection technique and pen use with our sample pen in the office.  Explained dosing regimen, but encouraged her to take to her new PCP today and have them give her more guidance.   ? ? ?Tommy Medal PharmD CPP [facility] ?[address] [place] ?[place], [place] [address] ?[phone] ?

## 2021-08-02 NOTE — Patient Instructions (Addendum)
Return for a a follow up appointment in 4-6 weeks. ? ?Go to the lab today to check kidney function - I will call you Monday with the results and we can determine which medication to try next.  ? ?Take your BP meds as follows: ? Continue with spironolactone 50 mg once daily ? ?Bring all of your meds, your BP cuff and your record of home blood pressures to your next appointment.  Exercise as you?re able, try to walk approximately 30 minutes per day.  Keep salt intake to a minimum, especially watch canned and prepared boxed foods.  Eat more fresh fruits and vegetables and fewer canned items.  Avoid eating in fast food restaurants.  ? ? HOW TO TAKE YOUR BLOOD PRESSURE: ?Rest 5 minutes before taking your blood pressure. ? Don?t smoke or drink caffeinated beverages for at least 30 minutes before. ?Take your blood pressure before (not after) you eat. ?Sit comfortably with your back supported and both feet on the floor (don?t cross your legs). ?Elevate your arm to heart level on a table or a desk. ?Use the proper sized cuff. It should fit smoothly and snugly around your bare upper arm. There should be enough room to slip a fingertip under the cuff. The bottom edge of the cuff should be 1 inch above the crease of the elbow. ?Ideally, take 3 measurements at one sitting and record the average. ? ? ?Ozempic ? ?TIPS FOR SUCCESS ?Write down the reasons why you want to lose weight and post it in a place where you'll see it often. ?Start small and work your way up. Keep in mind that it takes time to achieve goals, and small steps add up. ?Any additional movements help to burn calories. Taking the stairs rather than the elevator and parking at the far end of your parking lot are easy ways to start. Brisk walking for at least 30 minutes 4 or more days of the week is an excellent goal to work toward ? ?UNDERSTANDING WHAT IT MEANS TO FEEL FULL ?Did you know that it can take 15 minutes or more for your brain to receive the message that  you've eaten? That means that, if you eat less food, but consume it slower, you may still feel satisfied. ?Eating a lot of fruits and vegetables can also help you feel fuller. ?Eat off of smaller plates so that moderate portions don't seem too small ? ?TITRATION PLAN ?Will plan to follow the titration plan as below, pending patient is tolerating each dose before increasing to the next. Can slow titration if needed for tolerability.  ?  ?-Weeks 1-4: Inject 0.25 mg SQ once weekly  ?-Weeks 5-8: Inject 0.5 mg SQ once weekly  ? ? ?THANK YOU FOR CHOOSING [facility]  ?

## 2021-08-02 NOTE — Telephone Encounter (Signed)
Pt wanted to reach out and let Belenda Cruise know that she had a great appointment today. Belenda Cruise is very knowledgeable, patient and understanding. This is the patients second visit and she is very grateful that she was referred to [place]. Would like to thank Belenda Cruise again for her assistance today and let her know how great she is and she hopes she has a great day! :) ?

## 2021-08-03 LAB — BASIC METABOLIC PANEL
BUN/Creatinine Ratio: 18 (ref 9–23)
BUN: 26 mg/dL — ABNORMAL HIGH (ref 6–24)
CO2: 24 mmol/L (ref 20–29)
Calcium: 9.8 mg/dL (ref 8.7–10.2)
Chloride: 97 mmol/L (ref 96–106)
Creatinine, Ser: 1.45 mg/dL — ABNORMAL HIGH (ref 0.57–1.00)
Glucose: 239 mg/dL — ABNORMAL HIGH (ref 70–99)
Potassium: 5.3 mmol/L — ABNORMAL HIGH (ref 3.5–5.2)
Sodium: 136 mmol/L (ref 134–144)
eGFR: 42 mL/min/{1.73_m2} — ABNORMAL LOW (ref >59)

## 2021-08-07 ENCOUNTER — Other Ambulatory Visit: Payer: Self-pay | Admitting: Pharmacist Clinician (PhC)/ Clinical Pharmacy Specialist

## 2021-08-07 DIAGNOSIS — E669 Obesity, unspecified: Secondary | ICD-10-CM

## 2021-08-07 DIAGNOSIS — I10 Essential (primary) hypertension: Secondary | ICD-10-CM

## 2021-08-07 MED ORDER — HYDRALAZINE HCL 10 MG PO TABS: 10.0000 mg | ORAL_TABLET | Freq: Three times a day (TID) | ORAL | 3 refills | Status: DC

## 2021-08-07 NOTE — Addendum Note (Signed)
Addended by: Rockne Menghini on: 08/07/2021 09:52 AM ? ? Modules accepted: Orders ? ?

## 2021-08-15 LAB — BASIC METABOLIC PANEL
BUN/Creatinine Ratio: 14 (ref 9–23)
BUN: 18 mg/dL (ref 6–24)
CO2: 26 mmol/L (ref 20–29)
Calcium: 9.8 mg/dL (ref 8.7–10.2)
Chloride: 104 mmol/L (ref 96–106)
Creatinine, Ser: 1.3 mg/dL — ABNORMAL HIGH (ref 0.57–1.00)
Glucose: 161 mg/dL — ABNORMAL HIGH (ref 70–99)
Potassium: 4.6 mmol/L (ref 3.5–5.2)
Sodium: 145 mmol/L — ABNORMAL HIGH (ref 134–144)
eGFR: 48 mL/min/{1.73_m2} — ABNORMAL LOW (ref >59)

## 2021-08-30 ENCOUNTER — Telehealth: Payer: Self-pay | Admitting: Cardiovascular Disease

## 2021-08-30 NOTE — Telephone Encounter (Signed)
? ?  Pt is returning call. She said, if she unable to answer her phone to leave her a detailed message ?

## 2021-08-30 NOTE — Telephone Encounter (Signed)
It's fine to put her on my schedule.  (Either NL or [facility]), thanks. ?

## 2021-08-30 NOTE — Telephone Encounter (Signed)
Thank you :)

## 2021-08-30 NOTE — Telephone Encounter (Addendum)
Results called to patient who verbalizes understanding! Patient inquired about follow up. Per last office visit with Tommy Medal patient would follow up in 4-6 weeks. Will route to [facility] to see if patient needs to follow up with PharmD or Oval Linsey.  ? ? ? ? ? Skeet Latch, MD  ?08/21/2021  8:37 AM EDT  ? ?Kidney function improving but not yet back to baseline.  ? ? ?

## 2021-08-30 NOTE — Telephone Encounter (Signed)
Don't worry, I'll have Hale[facility] call her.  My schedule is confusing, even to me!  ?

## 2021-09-07 NOTE — Telephone Encounter (Signed)
Spoke with patient and she will call back to schedule follow up, her break at work is over and work is really strict  ?

## 2021-09-07 NOTE — Telephone Encounter (Signed)
Left message to call back to get scheduled. 

## 2021-10-26 ENCOUNTER — Telehealth: Payer: Self-pay

## 2021-10-26 NOTE — Telephone Encounter (Signed)
Called pt reference PREP class starting on 10/30/21 Mailbox is full unable to leave message

## 2021-10-31 ENCOUNTER — Other Ambulatory Visit (HOSPITAL_BASED_OUTPATIENT_CLINIC_OR_DEPARTMENT_OTHER): Payer: Self-pay | Admitting: Cardiovascular Disease

## 2021-11-01 ENCOUNTER — Other Ambulatory Visit: Payer: Self-pay | Admitting: Internal Medicine

## 2021-11-01 NOTE — Telephone Encounter (Signed)
Ok to refill x 6  

## 2021-11-01 NOTE — Telephone Encounter (Signed)
Last Ov 06/25/21 Filled 06/21/21 Is it ok to refill?

## 2021-12-25 ENCOUNTER — Ambulatory Visit (HOSPITAL_BASED_OUTPATIENT_CLINIC_OR_DEPARTMENT_OTHER): Payer: No Typology Code available for payment source | Admitting: Family

## 2021-12-25 ENCOUNTER — Encounter (HOSPITAL_BASED_OUTPATIENT_CLINIC_OR_DEPARTMENT_OTHER): Payer: Self-pay | Admitting: Family

## 2021-12-25 VITALS — BP 142/96 | HR 89 | Ht 66.25 in | Wt 303.0 lb

## 2021-12-25 DIAGNOSIS — E782 Mixed hyperlipidemia: Secondary | ICD-10-CM | POA: Diagnosis not present

## 2021-12-25 DIAGNOSIS — E1169 Type 2 diabetes mellitus with other specified complication: Secondary | ICD-10-CM | POA: Diagnosis not present

## 2021-12-25 DIAGNOSIS — E669 Obesity, unspecified: Secondary | ICD-10-CM

## 2021-12-25 DIAGNOSIS — I10 Essential (primary) hypertension: Secondary | ICD-10-CM | POA: Diagnosis not present

## 2021-12-25 DIAGNOSIS — G4733 Obstructive sleep apnea (adult) (pediatric): Secondary | ICD-10-CM

## 2021-12-25 MED ORDER — OZEMPIC (0.25 OR 0.5 MG/DOSE) 2 MG/3ML ~~LOC~~ SOPN: 0.2500 mg | PEN_INJECTOR | SUBCUTANEOUS | 0 refills | Status: AC

## 2021-12-25 MED ORDER — SPIRONOLACTONE 50 MG PO TABS: 50.0000 mg | ORAL_TABLET | Freq: Every day | ORAL | 3 refills | Status: DC

## 2021-12-25 MED ORDER — PANTOPRAZOLE SODIUM 40 MG PO TBEC: 40.0000 mg | DELAYED_RELEASE_TABLET | Freq: Every day | ORAL | 3 refills | Status: AC

## 2021-12-25 NOTE — Patient Instructions (Addendum)
Medication Instructions:  Continue your current medications.   We will consider changing your dose of Spironolactone depending on your lab results.   If you will please drop off your blood pressure readings in one week. Be sure to check after sitting and resting for 5-10 minutes.   *If you need a refill on your cardiac medications before your next appointment, please call your pharmacy*   Lab Work: Your physician recommends that you return for lab work today: BMP  If you have labs (blood work) drawn today and your tests are completely normal, you will receive your results only by: MyChart Message (if you have MyChart) OR A paper copy in the mail If you have any lab test that is abnormal or we need to change your treatment, we will call you to review the results.   Testing/Procedures: None ordered today.   Follow-Up: At [facility], you and your health needs are our priority.  As part of our continuing mission to provide you with exceptional heart care, we have created designated Provider Care Teams.  These Care Teams include your primary Cardiologist (physician) and Advanced Practice Providers (APPs -  Physician Assistants and Nurse Practitioners) who all work together to provide you with the care you need, when you need it.  We recommend signing up for the patient portal called "MyChart".  Sign up information is provided on this After Visit Summary.  MyChart is used to connect with patients for Virtual Visits (Telemedicine).  Patients are able to view lab/test results, encounter notes, upcoming appointments, etc.  Non-urgent messages can be sent to your provider as well.   To learn more about what you can do with MyChart, go to [email or web address].    Your next appointment:   2 month(s)  The format for your next appointment:   In Person  Provider:   Chilton Si, MD, Gillian Shields, NP, or Phillips Hay, [facility]  in [facility]    Other Instructions  Tips to Measure  your Blood Pressure Correctly  Here's what you can do to ensure a correct reading:  Don't drink a caffeinated beverage or smoke during the 30 minutes before the test.  Sit quietly for five minutes before the test begins.  During the measurement, sit in a chair with your feet on the floor and your arm supported so your elbow is at about heart level.  The inflatable part of the cuff should completely cover at least 80% of your upper arm, and the cuff should be placed on bare skin, not over a shirt.  Don't talk during the measurement.   Blood pressure categories  Blood pressure category SYSTOLIC (upper number)  DIASTOLIC (lower number)  Normal Less than 120 mm Hg and Less than 80 mm Hg  Elevated 120-129 mm Hg and Less than 80 mm Hg  High blood pressure: Stage 1 hypertension 130-139 mm Hg or 80-89 mm Hg  High blood pressure: Stage 2 hypertension 140 mm Hg or higher or 90 mm Hg or higher  Hypertensive crisis (consult your doctor immediately) Higher than 180 mm Hg and/or Higher than 120 mm Hg  Source: American Heart Association and American Stroke Association. For more on getting your blood pressure under control, buy Controlling Your Blood Pressure, a Special Health Report from [facility].   Blood Pressure Log   Date   Time  Blood Pressure  Example: Nov 1 9 AM 124/78  Heart Healthy Diet Recommendations: A low-salt diet is recommended. Meats should be grilled, baked, or boiled. Avoid fried foods. Focus on lean protein sources like fish or chicken with vegetables and fruits. The American Heart Association is a Chief Technology Officer!  American Heart Association Diet and Lifeystyle Recommendations   Exercise recommendations: The American Heart Association recommends 150 minutes of moderate intensity exercise weekly. Try 30 minutes of moderate intensity exercise 4-5 times per week. This could include walking, jogging, or  swimming.   Important Information About Sugar

## 2021-12-25 NOTE — Progress Notes (Signed)
Advanced Hypertension Clinic Assessment:    Date:  12/25/2021   ID:  [name], DOB 30-Nov-1960, MRN [id number]  PCP:  Burnis Medin, MD  Cardiologist:  None  Nephrologist:  Referring MD: Burnis Medin, MD   CC: Hypertension  History of Present Illness:    [name] is a 61 y.o. female with a hx of hypertension, hyperlipidemia, DM2, OSA, obesity here to follow-up in the Advanced Hypertension Clinic.   Established with advanced hypertension clinic 05/14/2021.  She reported having had high blood pressure for 25 years that has been difficult to control.  Previous medication intolerances detailed below.  Presents today for follow-up independently. Enjoys reading in her spare time.  No formal exercise routine. Notes work is stressful - does work from home.  Elevated BP in the 160s when checked during work but readings in the 120s when not working.  Encouraged to check after 5 to 10 minutes of resting. No lightheadedness, dizziness, pain, dyspnea. She noted side effects with Hydralazine and stopped after only a few doses. Noted swelling a few hours after taking.   Previous antihypertensives: Carvedilol - LE edema Olmesartan - pain Amlodipine - diarrhea Hydralazine- LE edema, diarrhea Candesartan/ARBs  Past Medical History:  Diagnosis Date   Allergic rhinitis    Asthma    Diabetes mellitus    GERD (gastroesophageal reflux disease)    History of kidney stones    Hyperlipidemia    Hypertension    x many years (since birht of her son at 64) Had workup with renal artery angiogram that showed no evidence for fibromuscular dysplasia ( no significant renal artery stenosis.) ECHO (8/11) EF 60-65%, mild LVH, no regional WMAs.   Low back pain    Morbid obesity (HCC)    OSA (obstructive sleep apnea) 12/28/2015    Past Surgical History:  Procedure Laterality Date   ABDOMINAL HYSTERECTOMY     partial for fibroids   CYSTOSCOPY WITH RETROGRADE PYELOGRAM, URETEROSCOPY  AND STENT PLACEMENT Left 07/20/2021   Procedure: CYSTOSCOPY WITH RETROGRADE PYELOGRAM, URETEROSCOPY HOLMIUM LASER LITOTRIPSY AND STENT PLACEMENT;  Surgeon: Janith Lima, MD;  Location: [facility];  Service: Urology;  Laterality: Left;  60 MINS FOR CASE   DENTAL SURGERY     TUBAL LIGATION      Current Medications: Current Meds  Medication Sig   albuterol (VENTOLIN HFA) 108 (90 Base) MCG/ACT inhaler INHALE 1-2 PUFFS INTO THE LUNGS 2 (TWO) TIMES DAILY AS NEEDED FOR WHEEZING OR SHORTNESS OF BREATH.   Azelastine-Fluticasone 137-50 MCG/ACT SUSP PLACE 2 SPRAYS INTO THE NOSE DAILY.   BEE POLLEN PO Take by mouth.   BREO ELLIPTA 200-25 MCG/INH AEPB TAKE 1 PUFF BY MOUTH EVERY DAY (Patient taking differently: Inhale 1 puff into the lungs daily as needed (wheezing or shortness of breath).)   docusate sodium (COLACE) 100 MG capsule Take 1 capsule (100 mg total) by mouth daily as needed for up to 30 doses.   Flaxseed, Linseed, (FLAX SEEDS) POWD Take 1 capsule by mouth daily.   glucose blood (ACCU-CHEK GUIDE) test strip Use as instructed to check sugar 3 times daily   guaifenesin (HUMIBID E) 400 MG TABS tablet Take 400 mg by mouth daily.   Lancets Misc. (ACCU-CHEK MULTICLIX LANCET DEV) KIT Use as instructed to check sugar 3 times daily   Multiple Vitamin (MULTIVITAMIN WITH MINERALS) TABS tablet Take 1 tablet by mouth at bedtime. One a Day 50 plus   OVER THE COUNTER MEDICATION Equate Chlorpheniramine maleate 4  mg.  PT reports takes in the am, in the afternoon and in the pm.   UNABLE TO FIND Take 1 Scoop by mouth daily. Med Name: Organic Proteins + Greens  Contains: Calcium 25 mg, Potassium 120 mg, Vitamin C 2.4 mg, Phosphorus 200 mg, Zinc 3 mg, Manganese 0.4 mg, Molybdenum 75 mcg, Iron 4 mg, Vitamin A 120 mcg, Vitamin K 32 mcg, Biotin 6 mcg, Magnesium 40 mg, Selenium 25 mcg, and Chromium 9.6 mcg.   [DISCONTINUED] OZEMPIC, 0.25 OR 0.5 MG/DOSE, 2 MG/3ML SOPN INJECT 0.25 MG INTO THE SKIN ONCE A WEEK   [DISCONTINUED]  pantoprazole (PROTONIX) 40 MG tablet TAKE 1 TABLET BY MOUTH EVERY DAY   [DISCONTINUED] spironolactone (ALDACTONE) 50 MG tablet Take 1 tablet (50 mg total) by mouth daily.     Allergies:   Hydralazine, Metformin and related, Olmesartan, Amlodipine, Candesartan cilexetil, Codeine, Farxiga [dapagliflozin], Hydrochlorothiazide, Ibuprofen, Telmisartan, and Zithromax [azithromycin]   Social History   Socioeconomic History   Marital status: Married    Spouse name: Not on file   Number of children: Not on file   Years of education: Not on file   Highest education level: Not on file  Occupational History   Not on file  Tobacco Use   Smoking status: Never   Smokeless tobacco: Never  Vaping Use   Vaping Use: Never used  Substance and Sexual Activity   Alcohol use: No    Alcohol/week: 0.0 standard drinks of alcohol   Drug use: No   Sexual activity: Not on file  Other Topics Concern   Not on file  Social History Narrative   Married   Regular exercise-yes   HH of 4    28 and 24    No pets   Works at [employer] Strain: [place]  (05/15/2021)   Overall Financial Resource Strain (CARDIA)    Difficulty of Paying Living Expenses: Not hard at all  Food Insecurity: No Food Insecurity (05/15/2021)   Hunger Vital Sign    Worried About Running Out of Food in the Last Year: Never true    [place] in the Last Year: Never true  Transportation Needs: No Transportation Needs (05/15/2021)   PRAPARE - Hydrologist (Medical): No    Lack of Transportation (Non-Medical): No  Physical Activity: Inactive (05/15/2021)   Exercise Vital Sign    Days of Exercise per Week: 0 days    Minutes of Exercise per Session: 0 min  Stress: Not on file  Social Connections: Not on file     Family History: The patient's family history includes Cancer in her mother; Hypertension in her maternal grandmother and mother;  Other in her father; Stroke in her maternal grandmother.  ROS:   Please see the history of present illness.     All other systems reviewed and are negative.  EKGs/Labs/Other Studies Reviewed:    EKG:  No EKG today    Echo 07/31/20 -Left ventricle: The cavity size was normal. Wall thickness was    increased in a pattern of mild LVH. Systolic function was normal.    The estimated ejection fraction was in the range of 55% to 60%.   Recent Labs: 06/05/2021: ALT 22; TSH 2.380 07/17/2021: Hemoglobin 14.8; Platelets 314 08/14/2021: BUN 18; Creatinine, Ser 1.30; Potassium 4.6; Sodium 145   Recent Lipid Panel    Component Value Date/Time   CHOL 195  06/05/2021 0753   TRIG 158 (H) 06/05/2021 0753   HDL 55 06/05/2021 0753   CHOLHDL 3.5 06/05/2021 0753   CHOLHDL 3.7 01/27/2020 0826   VLDL 28.6 05/25/2018 1019   LDLCALC 112 (H) 06/05/2021 0753   LDLCALC 107 (H) 01/27/2020 0826    Physical Exam:   VS:  BP (!) 142/96 Comment: right arm  Pulse 89   Ht 5' 6.25" (1.683 m)   Wt (!) 303 lb (137.4 kg)   BMI 48.54 kg/m  , BMI Body mass index is 48.54 kg/m. GENERAL:  Well appearing HEENT: Pupils equal round and reactive, fundi not visualized, oral mucosa unremarkable NECK:  No jugular venous distention, waveform within normal limits, carotid upstroke brisk and symmetric, no bruits, no thyromegaly LYMPHATICS:  No cervical adenopathy LUNGS:  Clear to auscultation bilaterally HEART:  RRR.  PMI not displaced or sustained,S1 and S2 within normal limits, no S3, no S4, no clicks, no rubs, no murmurs ABD:  Flat, positive bowel sounds normal in frequency in pitch, no bruits, no rebound, no guarding, no midline pulsatile mass, no hepatomegaly, no splenomegaly EXT:  2 plus pulses throughout, no edema, no cyanosis no clubbing SKIN:  No rashes no nodules NEURO:  Cranial nerves II through XII grossly intact, motor grossly intact throughout PSYCH:  Cognitively intact, oriented to person place and  time  ASSESSMENT/PLAN:    HTN -BP above goal of 130/80.  Home readings labile 120s-160s.  Encouraged to check after at least 5 to 10 minutes of rest.  Continue current dose of spironolactone 50 mg daily.  Update BMP today.  If renal function improved consider increasing dose to 100 mg.  Alternatively could consider addition of doxazosin versus minoxidil.  Multiple medication intolerances make medication management difficult. Heart healthy diet and regular cardiovascular exercise encouraged.    OSA-CPAP previously returned to [facility] as she was not using regularly.  She wishes to think about repeat sleep study.  Hyperlipidemia-not presently on lipid-lowering medication.  History of multiple medication intolerances.  Discuss again at follow-up.  Addressed today.  Screening for Secondary Hypertension:     05/15/2021    8:52 AM  Causes  Drugs/Herbals Screened     - Comments caffeine in coffee/tea, no EtOH  Renovascular HTN N/A  Sleep Apnea Screened     - Comments untreated OSA.  Hasn't tolerated CPAP  Thyroid Disease Screened     - Comments Repeat TSH  Hyperaldosteronism N/A  Pheochromocytoma N/A  Cushing's Syndrome Screened     - Comments cortisol  Hyperparathyroidism N/A  Coarctation of the Aorta Screened     - Comments Blood pressure symmetric  Compliance Screened    Relevant Labs/Studies:    Latest Ref Rng & Units 08/14/2021    8:06 AM 08/02/2021   11:48 AM 07/17/2021    8:25 AM  Basic Labs  Sodium 134 - 144 mmol/L 145  136  136   Potassium 3.5 - 5.2 mmol/L 4.6  5.3  4.2   Creatinine 0.57 - 1.00 mg/dL 1.30  1.45  1.05        Latest Ref Rng & Units 06/05/2021    7:53 AM 01/27/2020    8:26 AM  Thyroid   TSH 0.450 - 4.500 uIU/mL 2.380  1.71        Latest Ref Rng & Units 02/11/2013    9:35 AM 02/25/2011    9:43 AM 01/15/2011    8:56 AM  Renin/Aldosterone   Aldosterone ng/dL 23  22  16  Latest Ref Rng & Units 02/25/2011    9:43 AM 01/15/2011    8:56  AM  Cortisol  Cortisol  ug/dL 6.6  6.5         Disposition:    FU with MD/PharmD in 2 months    Medication Adjustments/Labs and Tests Ordered: Current medicines are reviewed at length with the patient today.  Concerns regarding medicines are outlined above.  Orders Placed This Encounter  Procedures   Basic metabolic panel   Meds ordered this encounter  Medications   spironolactone (ALDACTONE) 50 MG tablet    Sig: Take 1 tablet (50 mg total) by mouth daily.    Dispense:  90 tablet    Refill:  3   pantoprazole (PROTONIX) 40 MG tablet    Sig: Take 1 tablet (40 mg total) by mouth daily.    Dispense:  90 tablet    Refill:  3   Semaglutide,0.25 or 0.5MG/DOS, (OZEMPIC, 0.25 OR 0.5 MG/DOSE,) 2 MG/3ML SOPN    Sig: Inject 0.25 mg into the skin once a week.    Dispense:  3 mL    Refill:  0    NEED REFILL     Signed, Loel Dubonnet, NP  12/25/2021 4:17 PM    [facility]

## 2021-12-26 ENCOUNTER — Telehealth (HOSPITAL_BASED_OUTPATIENT_CLINIC_OR_DEPARTMENT_OTHER): Payer: Self-pay

## 2021-12-26 LAB — BASIC METABOLIC PANEL
BUN/Creatinine Ratio: 16 (ref 9–23)
BUN: 21 mg/dL (ref 6–24)
CO2: 24 mmol/L (ref 20–29)
Calcium: 9.9 mg/dL (ref 8.7–10.2)
Chloride: 103 mmol/L (ref 96–106)
Creatinine, Ser: 1.29 mg/dL — ABNORMAL HIGH (ref 0.57–1.00)
Glucose: 112 mg/dL — ABNORMAL HIGH (ref 70–99)
Potassium: 4.6 mmol/L (ref 3.5–5.2)
Sodium: 142 mmol/L (ref 134–144)
eGFR: 48 mL/min/{1.73_m2} — ABNORMAL LOW (ref >59)

## 2021-12-26 NOTE — Telephone Encounter (Addendum)
Called patient to review labs results, patient verbalizes understanding! Patient states that her blood pressures are more elevated during the day when she is stressed at work. Patient would like to see how her pressures do within the next two weeks and then she will drop of her log and we can reevaluate. NP notified.    ----- Message from Alver Sorrow, NP sent at 12/26/2021 10:05 AM EDT ----- Kidney function stable compared to previous but has not returned to normal.  Unable to further increase dose of spironolactone.  Due to elevated blood pressure would recommend adding doxazosin 1 mg at night.

## 2021-12-26 NOTE — Telephone Encounter (Signed)
Patient returned call to RN regarding medication.

## 2021-12-26 NOTE — Telephone Encounter (Signed)
Patient called back to clarify why we wanted to prescribe a new medication vs doubling her dose of spiro. Rn explained to patient that spiro cannot be adjsuted due to kidney numbers but can take small dose of doxazosin without affecting kidneys. Patient still wants to wait until we see her BP in two weeks to decide if she will take or not.

## 2022-01-01 IMAGING — CR DG CHEST 2V
2 series · 2 of 2 positions shown · non-contrast
Comparison: 07/25/2017

CLINICAL DATA: Cough and wheezing for 4 months.

EXAM:
CHEST - 2 VIEW

[w chest pa]
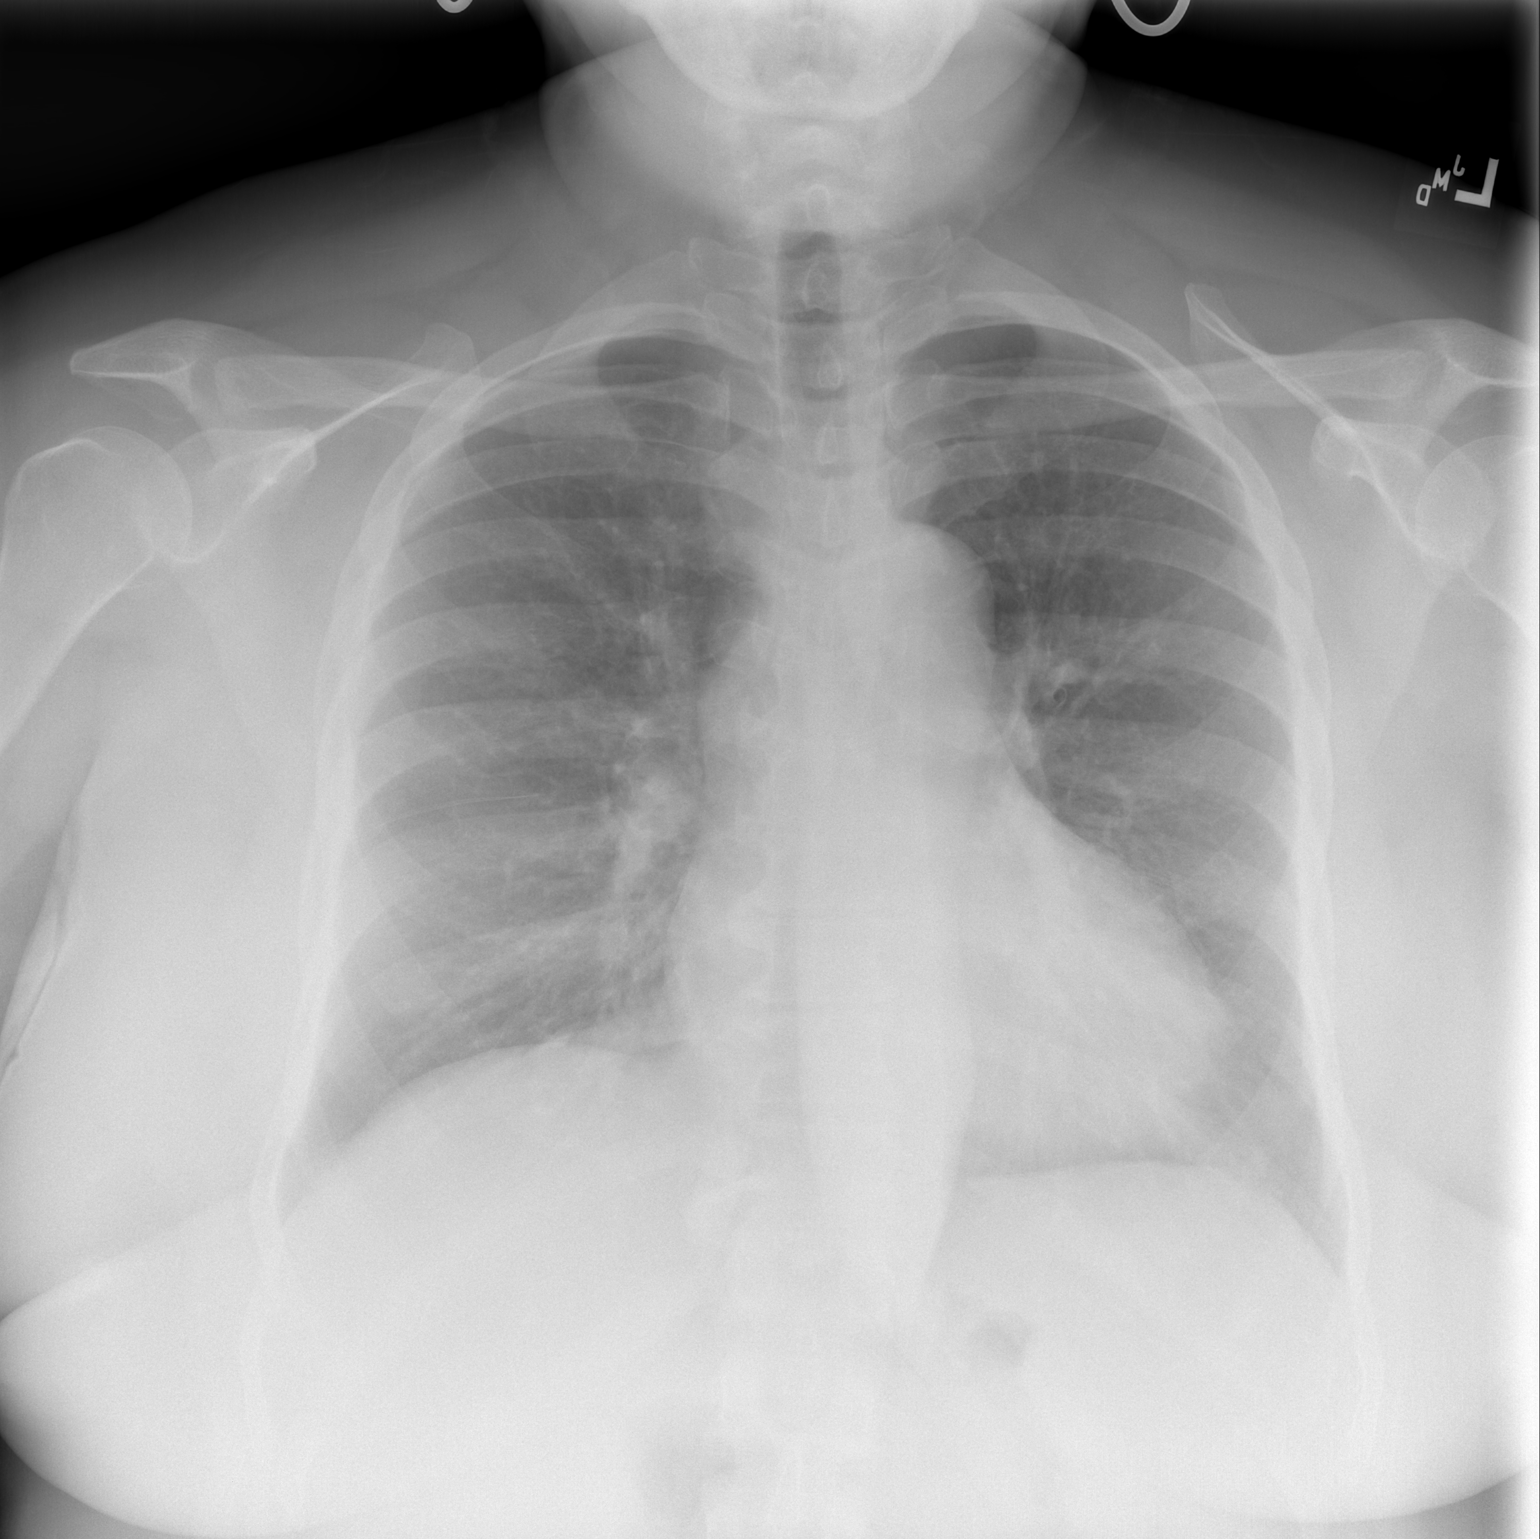

[w chest lat]
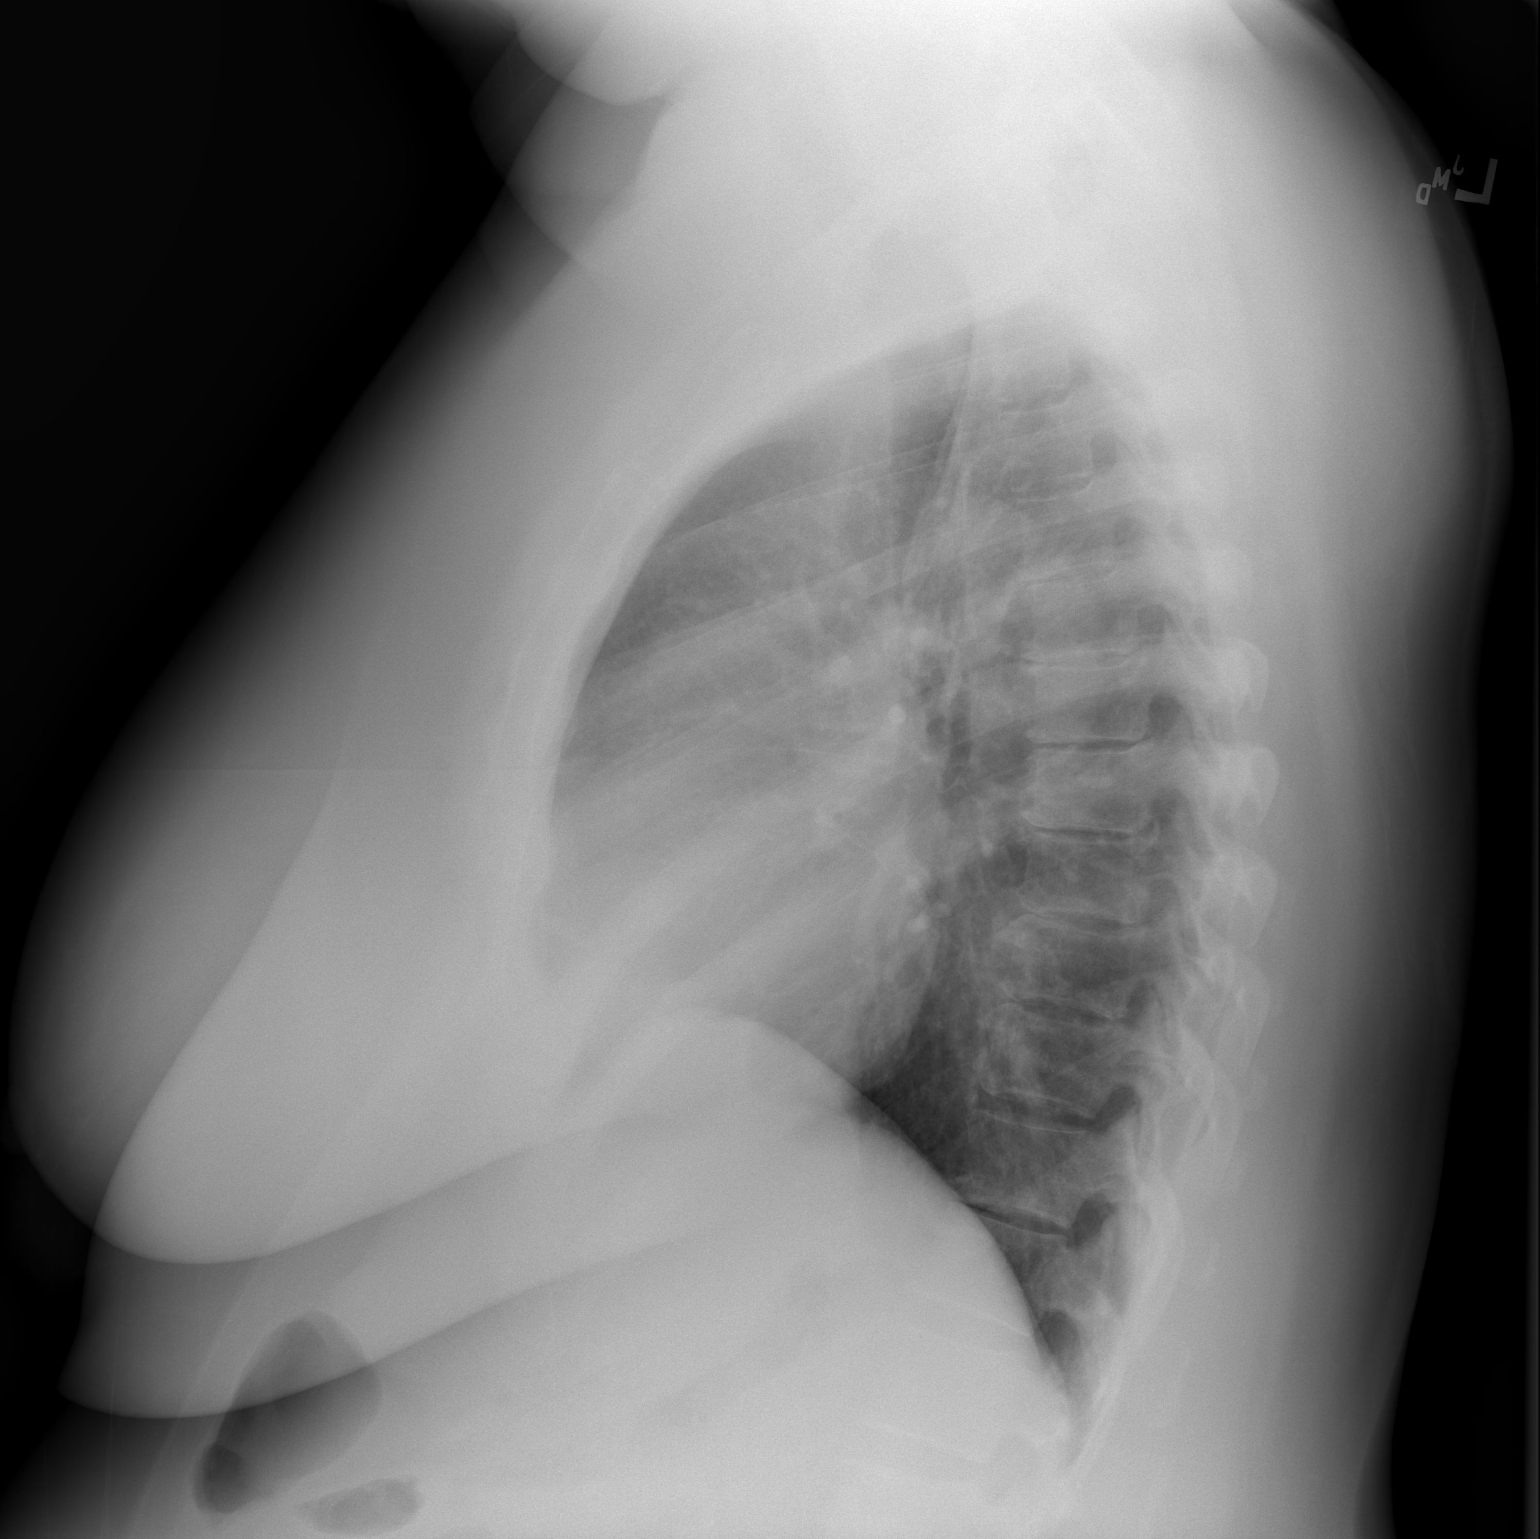

[2 of 2 positions shown; findings below may reference images not displayed]

FINDINGS: The heart size and mediastinal contours are within normal limits.
Both lungs are clear. The visualized skeletal structures are
unremarkable.
IMPRESSION: No active cardiopulmonary disease.

## 2022-01-18 ENCOUNTER — Telehealth (HOSPITAL_BASED_OUTPATIENT_CLINIC_OR_DEPARTMENT_OTHER): Payer: Self-pay | Admitting: Cardiovascular Disease

## 2022-01-18 MED ORDER — DOXAZOSIN MESYLATE 2 MG PO TABS: 2.0000 mg | ORAL_TABLET | Freq: Every day | ORAL | 1 refills | Status: DC

## 2022-01-18 NOTE — Telephone Encounter (Signed)
Rx Doxazosin 2 mg daily.  Please ask her to provide [place] updated BP log in one week.  May use Tylenol 650-1000mg  BID PRN for headache.   Gillian Shields, NP

## 2022-01-18 NOTE — Telephone Encounter (Signed)
Advised patient, verbalized understanding  

## 2022-01-18 NOTE — Telephone Encounter (Signed)
  Pt c/o BP issue: STAT if pt c/o blurred vision, one-sided weakness or slurred speech  1. What are your last 5 BP readings? 155/164  2. Are you having any other symptoms (ex. Dizziness, headache, blurred vision, passed out)? Headache,   3. What is your BP issue?    Patient called to say that she wanted to go ahead and be put on the 2nd bp medication.

## 2022-02-22 ENCOUNTER — Other Ambulatory Visit: Payer: Self-pay | Admitting: Internal Medicine

## 2022-02-22 DIAGNOSIS — Z8673 Personal history of transient ischemic attack (TIA), and cerebral infarction without residual deficits: Secondary | ICD-10-CM

## 2022-04-17 ENCOUNTER — Ambulatory Visit: Payer: No Typology Code available for payment source | Admitting: Podiatry

## 2022-04-17 ENCOUNTER — Encounter: Payer: Self-pay | Admitting: Podiatry

## 2022-04-17 DIAGNOSIS — M79674 Pain in right toe(s): Secondary | ICD-10-CM | POA: Diagnosis not present

## 2022-04-17 DIAGNOSIS — Z8673 Personal history of transient ischemic attack (TIA), and cerebral infarction without residual deficits: Secondary | ICD-10-CM | POA: Insufficient documentation

## 2022-04-17 DIAGNOSIS — E119 Type 2 diabetes mellitus without complications: Secondary | ICD-10-CM | POA: Diagnosis not present

## 2022-04-17 DIAGNOSIS — M2011 Hallux valgus (acquired), right foot: Secondary | ICD-10-CM | POA: Diagnosis not present

## 2022-04-17 DIAGNOSIS — M2141 Flat foot [pes planus] (acquired), right foot: Secondary | ICD-10-CM | POA: Diagnosis not present

## 2022-04-17 DIAGNOSIS — Q828 Other specified congenital malformations of skin: Secondary | ICD-10-CM

## 2022-04-17 DIAGNOSIS — B351 Tinea unguium: Secondary | ICD-10-CM | POA: Diagnosis not present

## 2022-04-17 DIAGNOSIS — M2012 Hallux valgus (acquired), left foot: Secondary | ICD-10-CM

## 2022-04-17 DIAGNOSIS — M2142 Flat foot [pes planus] (acquired), left foot: Secondary | ICD-10-CM

## 2022-04-17 DIAGNOSIS — E1142 Type 2 diabetes mellitus with diabetic polyneuropathy: Secondary | ICD-10-CM

## 2022-04-17 DIAGNOSIS — M79675 Pain in left toe(s): Secondary | ICD-10-CM | POA: Diagnosis not present

## 2022-04-21 NOTE — Progress Notes (Signed)
ANNUAL DIABETIC FOOT EXAM  Subjective: [name] presents today for annual diabetic foot examination.  Chief Complaint  Patient presents with   Diabetes    Diabetic foot care, patient came in today for a possible plantar wart on the right foot, which started 2 weeks ago,     Patient confirms h/o diabetes.  Patient relates 15 year h/o diabetes.  Patient denies any h/o foot wounds.  Patient relates some numbness, tingling, burning in feet.  Risk factors: diabetes, neuropathy, h/o TIA, HTN, hyperlipidemia.  Panosh, Standley Brooking, MD is patient's PCP.  Past Medical History:  Diagnosis Date   Allergic rhinitis    Asthma    Diabetes mellitus    GERD (gastroesophageal reflux disease)    History of kidney stones    Hyperlipidemia    Hypertension    x many years (since birht of her son at 32) Had workup with renal artery angiogram that showed no evidence for fibromuscular dysplasia ( no significant renal artery stenosis.) ECHO (8/11) EF 60-65%, mild LVH, no regional WMAs.   Low back pain    Morbid obesity (HCC)    OSA (obstructive sleep apnea) 12/28/2015   Patient Active Problem List   Diagnosis Date Noted   Personal history of transient ischemic attack (TIA), and cerebral infarction without residual deficits 04/17/2022   Colon cancer screening 06/26/2016   OSA (obstructive sleep apnea) 12/28/2015   Visit for preventive health examination 03/22/2014   Essential hypertension, benign 03/22/2014   Recurrent bronchospasm 03/22/2014   Influenza vaccination declined 03/22/2014   Diabetes mellitus without complication ([place]) 08/67/6195   Lower extremity edema 02/27/2013   Knee pain 02/27/2013   Allergic conjunctivitis and rhinitis 06/22/2012   Hyperaldosteronism ([place]) 02/25/2011   Diabetes mellitus type 2 in obese ([place]) 01/15/2011   FOOT PAIN, LEFT 11/17/2009   Chronic cough 10/13/2009   FREQUENCY, URINARY 09/09/2008   ADVERSE REACTION TO MEDICATION 12/02/2007   HYPOKALEMIA  08/19/2007   Nocturia 07/07/2007   HYPERGLYCEMIA 07/07/2007   Pure hypercholesterolemia 12/29/2006   Allergic rhinitis 12/29/2006   LOW BACK PAIN 12/29/2006   MORBID OBESITY 12/25/2006   Essential hypertension 12/25/2006   Past Surgical History:  Procedure Laterality Date   ABDOMINAL HYSTERECTOMY     partial for fibroids   CYSTOSCOPY WITH RETROGRADE PYELOGRAM, URETEROSCOPY AND STENT PLACEMENT Left 07/20/2021   Procedure: CYSTOSCOPY WITH RETROGRADE PYELOGRAM, URETEROSCOPY HOLMIUM LASER LITOTRIPSY AND STENT PLACEMENT;  Surgeon: Janith Lima, MD;  Location: [facility];  Service: Urology;  Laterality: Left;  60 MINS FOR CASE   DENTAL SURGERY     TUBAL LIGATION     Current Outpatient Medications on File Prior to Visit  Medication Sig Dispense Refill   albuterol (VENTOLIN HFA) 108 (90 Base) MCG/ACT inhaler INHALE 1-2 PUFFS INTO THE LUNGS 2 (TWO) TIMES DAILY AS NEEDED FOR WHEEZING OR SHORTNESS OF BREATH. 6.7 each 0   Azelastine-Fluticasone 137-50 MCG/ACT SUSP PLACE 2 SPRAYS INTO THE NOSE DAILY. 23 g 6   BEE POLLEN PO Take by mouth.     BREO ELLIPTA 200-25 MCG/INH AEPB TAKE 1 PUFF BY MOUTH EVERY DAY (Patient taking differently: Inhale 1 puff into the lungs daily as needed (wheezing or shortness of breath).) 60 each 5   docusate sodium (COLACE) 100 MG capsule Take 1 capsule (100 mg total) by mouth daily as needed for up to 30 doses. 30 capsule 0   doxazosin (CARDURA) 2 MG tablet Take 1 tablet (2 mg total) by mouth daily. 90 tablet 1  Flaxseed, Linseed, (FLAX SEEDS) POWD Take 1 capsule by mouth daily.     glucose blood (ACCU-CHEK GUIDE) test strip Use as instructed to check sugar 3 times daily 300 each 1   guaifenesin (HUMIBID E) 400 MG TABS tablet Take 400 mg by mouth daily.     Lancets Misc. (ACCU-CHEK MULTICLIX LANCET DEV) KIT Use as instructed to check sugar 3 times daily 1 each prn   Multiple Vitamin (MULTIVITAMIN WITH MINERALS) TABS tablet Take 1 tablet by mouth at bedtime. One a Day 50 plus      OVER THE COUNTER MEDICATION Equate Chlorpheniramine maleate 4 mg.  PT reports takes in the am, in the afternoon and in the pm.     pantoprazole (PROTONIX) 40 MG tablet Take 1 tablet (40 mg total) by mouth daily. 90 tablet 3   Semaglutide,0.25 or 0.5MG/DOS, (OZEMPIC, 0.25 OR 0.5 MG/DOSE,) 2 MG/3ML SOPN Inject 0.25 mg into the skin once a week. 3 mL 0   spironolactone (ALDACTONE) 50 MG tablet Take 1 tablet (50 mg total) by mouth daily. 90 tablet 3   UNABLE TO FIND Take 1 Scoop by mouth daily. Med Name: Organic Proteins + Greens  Contains: Calcium 25 mg, Potassium 120 mg, Vitamin C 2.4 mg, Phosphorus 200 mg, Zinc 3 mg, Manganese 0.4 mg, Molybdenum 75 mcg, Iron 4 mg, Vitamin A 120 mcg, Vitamin K 32 mcg, Biotin 6 mcg, Magnesium 40 mg, Selenium 25 mcg, and Chromium 9.6 mcg.     No current facility-administered medications on file prior to visit.    Allergies  Allergen Reactions   Hydralazine Diarrhea and Swelling   Metformin And Related Diarrhea    Diarrhea  intolerance at lowest dose   Olmesartan Other (See Comments)    Felt like insides dying   tried for 3 weeks see text   Amlodipine Diarrhea   Candesartan Cilexetil Other (See Comments)    Reaction to [place] - possibly drowsiness or diarrhea or nausea   Carvedilol     Other Reaction(s): edema/nausea   Codeine Hives, Itching and Swelling    Possible throat swelling - pt does not recall this reaction   [place] [Dapagliflozin] Other (See Comments)    Memory issues   Hydrochlorothiazide     hypokalemia   Ibuprofen Hives   Telmisartan Other (See Comments)    Reaction to Micardis - possibly drowsiness or diarrhea or nausea   Zithromax [Azithromycin] Diarrhea        Social History   Occupational History   Not on file  Tobacco Use   Smoking status: Never   Smokeless tobacco: Never  Vaping Use   Vaping Use: Never used  Substance and Sexual Activity   Alcohol use: No    Alcohol/week: 0.0 standard drinks of alcohol   Drug use: No    Sexual activity: Not on file   Family History  Problem Relation Age of Onset   Cancer Mother    Hypertension Mother    Other Father        died in MVA   Hypertension Maternal Grandmother    Stroke Maternal Grandmother    Immunization History  Administered Date(s) Administered   Influenza,inj,Quad PF,6+ Mos 03/15/2017   Td 08/30/1996     Review of Systems: Negative except as noted in the HPI.   Objective: There were no vitals filed for this visit.  [name] is a pleasant 61 y.o. female in NAD. AAO X 3.  Vascular Examination: Capillary refill time immediate b/l. Vascular status  intact b/l with palpable pedal pulses. Pedal hair present b/l. No edema. No pain with calf compression b/l. Skin temperature gradient WNL b/l.   Neurological Examination: Sensation grossly intact b/l with 10 gram monofilament. Vibratory sensation intact b/l. Pt has subjective symptoms of neuropathy.  Dermatological Examination: Pedal skin with normal turgor, texture and tone b/l. Toenails 1-5 b/l thick, discolored, elongated with subungual debris and pain on dorsal palpation. No open wounds b/l LE. No interdigital macerations noted b/l LE. Porokeratotic lesion(s) submet head 5 b/l. No erythema, no edema, no drainage, no fluctuance.  Musculoskeletal Examination: Muscle strength 5/5 to all lower extremity muscle groups bilaterally. HAV with bunion deformity noted b/l LE. Pes planus deformity noted bilateral LE.  Radiographs: None  Last A1c:      Latest Ref Rng & Units 06/25/2021    3:00 PM  Hemoglobin A1C  Hemoglobin-A1c 4.0 - 5.6 % 8.9    Footwear Assessment: Does the patient wear appropriate shoes? Yes. Does the patient need inserts/orthotics? Yes.  ADA Risk Categorization: Low Risk :  Patient has all of the following: Intact protective sensation No prior foot ulcer  No severe deformity Pedal pulses present  Assessment: 1. Pain due to onychomycosis of toenails of both feet    2. Porokeratosis   3. Hallux valgus, acquired, bilateral   4. Pes planus of both feet   5. Diabetic peripheral neuropathy associated with type 2 diabetes mellitus ([place])   6. Encounter for diabetic foot exam ([place])     Plan: No orders of the defined types were placed in this encounter.  -Patient was evaluated and treated. All patient's and/or POA's questions/concerns answered on today's visit. -Diabetic foot examination performed today. -Stressed the importance of good glycemic control and the detriment of not  controlling glucose levels in relation to the foot. -Patient to continue soft, supportive shoe gear daily. -Toenails 1-5 b/l were debrided in length and girth with sterile nail nippers and dremel without iatrogenic bleeding.  -Porokeratotic lesion(s) submet head 5 b/l pared and enucleated with sterile currette without incident. Total number of lesions debrided=2. -Patient/POA to call should there be question/concern in the interim. Return in about 3 months (around 07/17/2022).  Marzetta Board, DPM

## 2022-06-04 ENCOUNTER — Telehealth: Payer: Self-pay | Admitting: Pulmonary Disease

## 2022-06-04 NOTE — Telephone Encounter (Signed)
PT calling for a [facility]rio refill but has not been seen in awhile (05/14/20). Pls call to advise @ [phone]  Pharm: [facility] on [address]  Has to check calender before making appt for FU/RX.Tried to get her to.

## 2022-06-05 NOTE — Telephone Encounter (Signed)
Patient last seen 05/30/2020 with no pending appt.  She is aware that an appt is needed prior to refills.  She will call PCP and request refill. She stated that she would call back to schedule appt if needed. Nothing further needed.

## 2022-07-20 ENCOUNTER — Other Ambulatory Visit (HOSPITAL_BASED_OUTPATIENT_CLINIC_OR_DEPARTMENT_OTHER): Payer: Self-pay | Admitting: Family

## 2022-07-22 NOTE — Telephone Encounter (Signed)
Rx(s) sent to pharmacy electronically.  

## 2022-07-28 ENCOUNTER — Ambulatory Visit
Admission: EM | Admit: 2022-07-28 | Discharge: 2022-07-28 | Disposition: A | Discharge status: Home / Self Care | Payer: No Typology Code available for payment source | Attending: Family Medicine | Admitting: Family Medicine

## 2022-07-28 DIAGNOSIS — Z1152 Encounter for screening for COVID-19: Secondary | ICD-10-CM | POA: Diagnosis not present

## 2022-07-28 DIAGNOSIS — I10 Essential (primary) hypertension: Secondary | ICD-10-CM | POA: Insufficient documentation

## 2022-07-28 DIAGNOSIS — R059 Cough, unspecified: Secondary | ICD-10-CM | POA: Diagnosis present

## 2022-07-28 DIAGNOSIS — J4521 Mild intermittent asthma with (acute) exacerbation: Secondary | ICD-10-CM | POA: Diagnosis not present

## 2022-07-28 DIAGNOSIS — J069 Acute upper respiratory infection, unspecified: Secondary | ICD-10-CM

## 2022-07-28 MED ORDER — ALBUTEROL SULFATE HFA 108 (90 BASE) MCG/ACT IN AERS: 1.0000 | INHALATION_SPRAY | RESPIRATORY_TRACT | 0 refills | Status: AC | PRN

## 2022-07-28 MED ORDER — PREDNISONE 20 MG PO TABS: 40.0000 mg | ORAL_TABLET | Freq: Every day | ORAL | 0 refills | Status: AC

## 2022-07-28 NOTE — ED Triage Notes (Signed)
Pt c/o cough and congestion that began Wednesday but has not yet resolved.   Home interventions: Mucinex

## 2022-07-28 NOTE — ED Provider Notes (Addendum)
[facility]    CSN: [id number] Arrival date & time: 07/28/22  1213      History   Chief Complaint Chief Complaint  Patient presents with   Cough   Nasal Congestion    HPI [name] is a 62 y.o. female.    Cough  For cough and congestion that began on the evening of March 20.  She maybe had fever 1 day but that has resolved quickly.  No vomiting or diarrhea.  She has had a couple of episodes of wheezing.  She does have diabetes.  She has had prednisone increased her sugar some in the past.  She states usually she is prescribed Breo for "acute bronchitis".  She does have hypertension and a new medication has been added recently.  Her blood pressures today are quite elevated  She is actually starting to feel a little better in the last few days.  She states Tessalon Perles makes her stomach upset.  Past Medical History:  Diagnosis Date   Allergic rhinitis    Asthma    Diabetes mellitus    GERD (gastroesophageal reflux disease)    History of kidney stones    Hyperlipidemia    Hypertension    x many years (since birht of her son at 1) Had workup with renal artery angiogram that showed no evidence for fibromuscular dysplasia ( no significant renal artery stenosis.) ECHO (8/11) EF 60-65%, mild LVH, no regional WMAs.   Low back pain    Morbid obesity (HCC)    OSA (obstructive sleep apnea) 12/28/2015    Patient Active Problem List   Diagnosis Date Noted   Personal history of transient ischemic attack (TIA), and cerebral infarction without residual deficits 04/17/2022   Colon cancer screening 06/26/2016   OSA (obstructive sleep apnea) 12/28/2015   Visit for preventive health examination 03/22/2014   Essential hypertension, benign 03/22/2014   Recurrent bronchospasm 03/22/2014   Influenza vaccination declined 03/22/2014   Diabetes mellitus without complication ([place]) [id number]   Lower extremity edema 02/27/2013   Knee pain 02/27/2013   Allergic  conjunctivitis and rhinitis 06/22/2012   Hyperaldosteronism ([place]) 02/25/2011   Diabetes mellitus type 2 in obese ([place]) 01/15/2011   FOOT PAIN, LEFT 11/17/2009   Chronic cough 10/13/2009   FREQUENCY, URINARY 09/09/2008   ADVERSE REACTION TO MEDICATION 12/02/2007   HYPOKALEMIA 08/19/2007   Nocturia 07/07/2007   HYPERGLYCEMIA 07/07/2007   Pure hypercholesterolemia 12/29/2006   Allergic rhinitis 12/29/2006   LOW BACK PAIN 12/29/2006   MORBID OBESITY 12/25/2006   Essential hypertension 12/25/2006    Past Surgical History:  Procedure Laterality Date   ABDOMINAL HYSTERECTOMY     partial for fibroids   CYSTOSCOPY WITH RETROGRADE PYELOGRAM, URETEROSCOPY AND STENT PLACEMENT Left 07/20/2021   Procedure: CYSTOSCOPY WITH RETROGRADE PYELOGRAM, URETEROSCOPY HOLMIUM LASER LITOTRIPSY AND STENT PLACEMENT;  Surgeon: Janith Lima, MD;  Location: [facility];  Service: Urology;  Laterality: Left;  [address] [place]      OB History   No obstetric history on file.      Home Medications    Prior to Admission medications   Medication Sig Start Date End Date Taking? Authorizing Provider  predniSONE (DELTASONE) 20 MG tablet Take 2 tablets (40 mg total) by mouth daily with breakfast for 3 days. 07/28/22 07/31/22 Yes Darlisa Spruiell, Gwenlyn Perking, MD  albuterol (VENTOLIN HFA) 108 (90 Base) MCG/ACT inhaler Inhale 1-2 puffs into the lungs every 4 (four)  hours as needed for wheezing or shortness of breath. 07/28/22   Barrett Henle, MD  Azelastine-Fluticasone 137-50 MCG/ACT SUSP PLACE 2 SPRAYS INTO THE NOSE DAILY. 11/02/21   Panosh, Standley Brooking, MD  BEE POLLEN PO Take by mouth.    [provider]  BREO ELLIPTA 200-25 MCG/INH AEPB TAKE 1 PUFF BY MOUTH EVERY DAY Patient taking differently: Inhale 1 puff into the lungs daily as needed (wheezing or shortness of breath). 06/20/20   Mannam, Hart Robinsons, MD  docusate sodium (COLACE) 100 MG capsule Take 1 capsule (100 mg total) by mouth daily  as needed for up to 30 doses. 07/20/21   Janith Lima, MD  doxazosin (CARDURA) 2 MG tablet TAKE 1 TABLET BY MOUTH EVERY DAY 07/22/22   Loel Dubonnet, NP  Flaxseed, Linseed, (FLAX SEEDS) POWD Take 1 capsule by mouth daily.    [provider]  glucose blood (ACCU-CHEK GUIDE) test strip Use as instructed to check sugar 3 times daily 07/02/17   Philemon Kingdom, MD  guaifenesin (HUMIBID E) 400 MG TABS tablet Take 400 mg by mouth daily.    [provider]  Lancets Misc. (ACCU-CHEK MULTICLIX LANCET DEV) KIT Use as instructed to check sugar 3 times daily 07/02/17   Philemon Kingdom, MD  Multiple Vitamin (MULTIVITAMIN WITH MINERALS) TABS tablet Take 1 tablet by mouth at bedtime. One a Day 50 plus    [provider]  OVER THE COUNTER MEDICATION Equate Chlorpheniramine maleate 4 mg.  PT reports takes in the am, in the afternoon and in the pm.    [provider]  pantoprazole (PROTONIX) 40 MG tablet Take 1 tablet (40 mg total) by mouth daily. 12/25/21   Loel Dubonnet, NP  Semaglutide,0.25 or 0.5MG /DOS, (OZEMPIC, 0.25 OR 0.5 MG/DOSE,) 2 MG/3ML SOPN Inject 0.25 mg into the skin once a week. 12/25/21   Loel Dubonnet, NP  spironolactone (ALDACTONE) 50 MG tablet Take 1 tablet (50 mg total) by mouth daily. 12/25/21   Loel Dubonnet, NP  UNABLE TO FIND Take 1 Scoop by mouth daily. Med Name: Organic Proteins + Greens  Contains: Calcium 25 mg, Potassium 120 mg, Vitamin C 2.4 mg, Phosphorus 200 mg, Zinc 3 mg, Manganese 0.4 mg, Molybdenum 75 mcg, Iron 4 mg, Vitamin A 120 mcg, Vitamin K 32 mcg, Biotin 6 mcg, Magnesium 40 mg, Selenium 25 mcg, and Chromium 9.6 mcg.    [provider]    Family History Family History  Problem Relation Age of Onset   Cancer Mother    Hypertension Mother    Other Father        died in MVA   Hypertension Maternal Grandmother    Stroke Maternal Grandmother     Social History Social History   Tobacco Use   Smoking status: Never    Smokeless tobacco: Never  Vaping Use   Vaping Use: Never used  Substance Use Topics   Alcohol use: No    Alcohol/week: 0.0 standard drinks of alcohol   Drug use: No     Allergies   Hydralazine, Metformin and related, Olmesartan, Amlodipine, Candesartan cilexetil, Carvedilol, Codeine, Farxiga [dapagliflozin], Hydrochlorothiazide, Ibuprofen, Telmisartan, and Zithromax [azithromycin]   Review of Systems Review of Systems  Respiratory:  Positive for cough.      Physical Exam Triage Vital Signs ED Triage Vitals  Enc Vitals Group     BP 07/28/22 1237 (!) 161/122     Pulse Rate 07/28/22 1237 82     Resp 07/28/22 1237  18     Temp 07/28/22 1237 98.8 F (37.1 C)     Temp Source 07/28/22 1237 Oral     SpO2 07/28/22 1237 95 %     Weight --      Height --      Head Circumference --      Peak Flow --      Pain Score 07/28/22 1232 0     Pain Loc --      Pain Edu? --      Excl. in [place]? --    No data found.  Updated Vital Signs BP (!) 194/96 (BP Location: Right Arm) Comment: provider made aware, patient denies chest pain  Pulse 82   Temp 98.8 F (37.1 C) (Oral)   Resp 18   SpO2 95%   Visual Acuity Right Eye Distance:   Left Eye Distance:   Bilateral Distance:    Right Eye Near:   Left Eye Near:    Bilateral Near:     Physical Exam Vitals reviewed.  Constitutional:      General: She is not in acute distress.    Appearance: She is not toxic-appearing.  HENT:     Right Ear: Tympanic membrane and ear canal normal.     Left Ear: Tympanic membrane and ear canal normal.     Nose: Congestion present.     Mouth/Throat:     Mouth: Mucous membranes are moist.     Pharynx: No oropharyngeal exudate or posterior oropharyngeal erythema.  Eyes:     Extraocular Movements: Extraocular movements intact.     Conjunctiva/sclera: Conjunctivae normal.     Pupils: Pupils are equal, round, and reactive to light.  Cardiovascular:     Rate and Rhythm: Normal rate and regular rhythm.      Heart sounds: No murmur heard. Pulmonary:     Effort: No respiratory distress.     Breath sounds: No stridor. No wheezing, rhonchi or rales.  Musculoskeletal:     Cervical back: Neck supple.  Lymphadenopathy:     Cervical: No cervical adenopathy.  Skin:    Capillary Refill: Capillary refill takes less than 2 seconds.     Coloration: Skin is not jaundiced or pale.  Neurological:     General: No focal deficit present.     Mental Status: She is alert and oriented to person, place, and time.  Psychiatric:        Behavior: Behavior normal.      UC Treatments / Results  Labs (all labs ordered are listed, but only abnormal results are displayed) Labs Reviewed  SARS CORONAVIRUS 2 (TAT 6-24 HRS)    EKG   Radiology No results found.  Procedures Procedures (including critical care time)  Medications Ordered in UC Medications - No data to display  Initial Impression / Assessment and Plan / UC Course  I have reviewed the triage vital signs and the nursing notes.  Pertinent labs & imaging results that were available during my care of the patient were reviewed by me and considered in my medical decision making (see chart for details).        I want to treat for asthma exacerbation.  She was alarmed initially when I discussed the prednisone prescription with her.  I am just going to do 3 days.  We discussed doing Breo instead, but I do not think that would be quite as effective.  COVID swab was done, and if positive she is a candidate for Paxlovid, renal dosing.  Her EGFR was 48 in August 2023. She is to follow-up with her primary care about her hypertension.  Final Clinical Impressions(s) / UC Diagnoses   Final diagnoses:  Viral URI  Mild intermittent asthma with (acute) exacerbation     Discharge Instructions      Albuterol inhaler--do 2 puffs every 4 hours as needed for shortness of breath or wheezing  Take prednisone 20 mg--2 daily for 3 days    You  have been swabbed for COVID, and the test will result in the next 24 hours. Our staff will call you if positive. If the COVID test is positive, you should quarantine until you are fever free for 24 hours and you are starting to feel better, and then take added precautions for the next 5 days, such as physical distancing/wearing a mask and good hand hygiene/washing.       ED Prescriptions     Medication Sig Dispense Auth. Provider   albuterol (VENTOLIN HFA) 108 (90 Base) MCG/ACT inhaler Inhale 1-2 puffs into the lungs every 4 (four) hours as needed for wheezing or shortness of breath. 6.7 each Barrett Henle, MD   predniSONE (DELTASONE) 20 MG tablet Take 2 tablets (40 mg total) by mouth daily with breakfast for 3 days. 6 tablet Windy Carina Gwenlyn Perking, MD      PDMP not reviewed this encounter.   Barrett Henle, MD 07/28/22 1322    Barrett Henle, MD 07/28/22 1324

## 2022-07-28 NOTE — Discharge Instructions (Signed)
Albuterol inhaler--do 2 puffs every 4 hours as needed for shortness of breath or wheezing  Take prednisone 20 mg--2 daily for 3 days    You have been swabbed for COVID, and the test will result in the next 24 hours. Our staff will call you if positive. If the COVID test is positive, you should quarantine until you are fever free for 24 hours and you are starting to feel better, and then take added precautions for the next 5 days, such as physical distancing/wearing a mask and good hand hygiene/washing.

## 2022-07-29 LAB — SARS CORONAVIRUS 2 (TAT 6-24 HRS): SARS Coronavirus 2: NEGATIVE

## 2022-08-08 ENCOUNTER — Encounter: Payer: Self-pay | Admitting: Internal Medicine

## 2022-08-25 ENCOUNTER — Ambulatory Visit
Admission: EM | Admit: 2022-08-25 | Discharge: 2022-08-25 | Disposition: A | Discharge status: Home / Self Care | Payer: No Typology Code available for payment source | Attending: Internal Medicine | Admitting: Internal Medicine

## 2022-08-25 DIAGNOSIS — J4531 Mild persistent asthma with (acute) exacerbation: Secondary | ICD-10-CM

## 2022-08-25 MED ORDER — FLUTICASONE FUROATE-VILANTEROL 200-25 MCG/ACT IN AEPB: 1.0000 | INHALATION_SPRAY | Freq: Every day | RESPIRATORY_TRACT | 0 refills | Status: DC

## 2022-08-25 MED ORDER — MONTELUKAST SODIUM 10 MG PO TABS: 10.0000 mg | ORAL_TABLET | Freq: Every day | ORAL | 0 refills | Status: AC

## 2022-08-25 MED ORDER — AMLODIPINE BESYLATE 5 MG PO TABS: 5.0000 mg | ORAL_TABLET | Freq: Every day | ORAL | 1 refills | Status: AC

## 2022-08-25 NOTE — ED Triage Notes (Signed)
Pt presents with c/o SOB, cough for a few days.   States she has a hx of bronchitis. States she has been wheezing more after being around her sons cat.

## 2022-08-25 NOTE — Discharge Instructions (Addendum)
Your symptoms are likely related to pollen and also domestic animals Please take medications as prescribed You may use the albuterol up to 6 times a day I have added amlodipine to your blood pressure medication as we discussed.  Please monitor for diarrhea and if you have worsening diarrhea please reach out to your primary care provider Please return to urgent care if you have any other concerns.

## 2022-08-25 NOTE — ED Provider Notes (Signed)
[facility]    CSN: [id number] Arrival date & time: 08/25/22  4098      History   Chief Complaint Chief Complaint  Patient presents with   Cough   Nasal Congestion    HPI [name] is a 62 y.o. female comes to urgent care with several days history of wheezing, nasal congestion and nonproductive cough.  Patient has a history of asthma.  Patient's son recently moved back into her house.  He brought to cats with him.  Since the cats were brought into the house she has had worsening shortness of breath wheezing, chest tightness and nonproductive cough.  No fever or chills.  She was seen in the urgent care about 3 weeks ago for asthma exacerbation and viral URI.  She was on a course of steroids and an inhaler.  His symptoms have been persistent.  No significant sputum production.  Patient has a history of hypertension currently managed with spironolactone 50 mg orally daily.  Patient's blood pressure remains elevated.  She denies any chest pain or chest pressure.  No abdominal pain or headaches. HPI  Past Medical History:  Diagnosis Date   Allergic rhinitis    Asthma    Diabetes mellitus    GERD (gastroesophageal reflux disease)    History of kidney stones    Hyperlipidemia    Hypertension    x many years (since birht of her son at 34) Had workup with renal artery angiogram that showed no evidence for fibromuscular dysplasia ( no significant renal artery stenosis.) ECHO (8/11) EF 60-65%, mild LVH, no regional WMAs.   Low back pain    Morbid obesity    OSA (obstructive sleep apnea) 12/28/2015    Patient Active Problem List   Diagnosis Date Noted   Personal history of transient ischemic attack (TIA), and cerebral infarction without residual deficits 04/17/2022   Colon cancer screening 06/26/2016   OSA (obstructive sleep apnea) 12/28/2015   Visit for preventive health examination 03/22/2014   Essential hypertension, benign 03/22/2014   Recurrent bronchospasm  03/22/2014   Influenza vaccination declined 03/22/2014   Diabetes mellitus without complication 03/22/2014   Lower extremity edema 02/27/2013   Knee pain 02/27/2013   Allergic conjunctivitis and rhinitis 06/22/2012   Hyperaldosteronism 02/25/2011   Diabetes mellitus type 2 in obese 01/15/2011   FOOT PAIN, LEFT 11/17/2009   Chronic cough 10/13/2009   FREQUENCY, URINARY 09/09/2008   ADVERSE REACTION TO MEDICATION 12/02/2007   HYPOKALEMIA 08/19/2007   Nocturia 07/07/2007   HYPERGLYCEMIA 07/07/2007   Pure hypercholesterolemia 12/29/2006   Allergic rhinitis 12/29/2006   LOW BACK PAIN 12/29/2006   MORBID OBESITY 12/25/2006   Essential hypertension 12/25/2006    Past Surgical History:  Procedure Laterality Date   ABDOMINAL HYSTERECTOMY     partial for fibroids   CYSTOSCOPY WITH RETROGRADE PYELOGRAM, URETEROSCOPY AND STENT PLACEMENT Left 07/20/2021   Procedure: CYSTOSCOPY WITH RETROGRADE PYELOGRAM, URETEROSCOPY HOLMIUM LASER LITOTRIPSY AND STENT PLACEMENT;  Surgeon: Jannifer Hick, MD;  Location: [facility];  Service: Urology;  Laterality: Left;  60 MINS FOR CASE   DENTAL SURGERY     TUBAL LIGATION      OB History   No obstetric history on file.      Home Medications    Prior to Admission medications   Medication Sig Start Date End Date Taking? Authorizing Provider  amLODipine (NORVASC) 5 MG tablet Take 1 tablet (5 mg total) by mouth daily. 08/25/22  Yes Valyncia Wiens, Britta Mccreedy, MD  fluticasone  furoate-vilanterol (BREO ELLIPTA) 200-25 MCG/ACT AEPB Inhale 1 puff into the lungs daily. 08/25/22  Yes Makeila Yamaguchi, Britta Mccreedy, MD  montelukast (SINGULAIR) 10 MG tablet Take 1 tablet (10 mg total) by mouth at bedtime. 08/25/22  Yes Neosha Switalski, Britta Mccreedy, MD  albuterol (VENTOLIN HFA) 108 (90 Base) MCG/ACT inhaler Inhale 1-2 puffs into the lungs every 4 (four) hours as needed for wheezing or shortness of breath. 07/28/22   Zenia Resides, MD  Azelastine-Fluticasone 137-50 MCG/ACT SUSP PLACE 2 SPRAYS INTO THE  NOSE DAILY. 11/02/21   Panosh, Neta Mends, MD  BEE POLLEN PO Take by mouth.    [provider]  docusate sodium (COLACE) 100 MG capsule Take 1 capsule (100 mg total) by mouth daily as needed for up to 30 doses. 07/20/21   Jannifer Hick, MD  Flaxseed, Linseed, (FLAX SEEDS) POWD Take 1 capsule by mouth daily.    [provider]  glucose blood (ACCU-CHEK GUIDE) test strip Use as instructed to check sugar 3 times daily 07/02/17   Carlus Pavlov, MD  guaifenesin (HUMIBID E) 400 MG TABS tablet Take 400 mg by mouth daily.    [provider]  Lancets Misc. (ACCU-CHEK MULTICLIX LANCET DEV) KIT Use as instructed to check sugar 3 times daily 07/02/17   Carlus Pavlov, MD  Multiple Vitamin (MULTIVITAMIN WITH MINERALS) TABS tablet Take 1 tablet by mouth at bedtime. One a Day 50 plus    [provider]  OVER THE COUNTER MEDICATION Equate Chlorpheniramine maleate 4 mg.  PT reports takes in the am, in the afternoon and in the pm.    [provider]  pantoprazole (PROTONIX) 40 MG tablet Take 1 tablet (40 mg total) by mouth daily. 12/25/21   Alver Sorrow, NP  Semaglutide,0.25 or 0.5MG /DOS, (OZEMPIC, 0.25 OR 0.5 MG/DOSE,) 2 MG/3ML SOPN Inject 0.25 mg into the skin once a week. 12/25/21   Alver Sorrow, NP  spironolactone (ALDACTONE) 50 MG tablet Take 1 tablet (50 mg total) by mouth daily. 12/25/21   Alver Sorrow, NP  UNABLE TO FIND Take 1 Scoop by mouth daily. Med Name: Organic Proteins + Greens  Contains: Calcium 25 mg, Potassium 120 mg, Vitamin C 2.4 mg, Phosphorus 200 mg, Zinc 3 mg, Manganese 0.4 mg, Molybdenum 75 mcg, Iron 4 mg, Vitamin A 120 mcg, Vitamin K 32 mcg, Biotin 6 mcg, Magnesium 40 mg, Selenium 25 mcg, and Chromium 9.6 mcg.    [provider]    Family History Family History  Problem Relation Age of Onset   Cancer Mother    Hypertension Mother    Other Father        died in MVA   Hypertension Maternal Grandmother    Stroke Maternal  Grandmother     Social History Social History   Tobacco Use   Smoking status: Never   Smokeless tobacco: Never  Vaping Use   Vaping Use: Never used  Substance Use Topics   Alcohol use: No    Alcohol/week: 0.0 standard drinks of alcohol   Drug use: No     Allergies   Hydralazine, Metformin and related, Olmesartan, Amlodipine, Candesartan cilexetil, Carvedilol, Codeine, Farxiga [dapagliflozin], Hydrochlorothiazide, Ibuprofen, Telmisartan, and Zithromax [azithromycin]   Review of Systems Review of Systems As per HPI  Physical Exam Triage Vital Signs ED Triage Vitals [08/25/22 0954]  Enc Vitals Group     BP (!) 175/100     Pulse Rate 82     Resp 18     Temp  Temp Source Oral     SpO2 97 %     Weight      Height      Head Circumference      Peak Flow      Pain Score 0     Pain Loc      Pain Edu?      Excl. in [place]?    No data found.  Updated Vital Signs BP (!) 175/100 (BP Location: Left Arm) Comment: has not taken her BP meds yesterday or today  Pulse 82   Resp 18   SpO2 97%   Visual Acuity Right Eye Distance:   Left Eye Distance:   Bilateral Distance:    Right Eye Near:   Left Eye Near:    Bilateral Near:     Physical Exam Vitals and nursing note reviewed.  Constitutional:      General: She is not in acute distress.    Appearance: She is not ill-appearing.  HENT:     Right Ear: Tympanic membrane normal.     Left Ear: Tympanic membrane normal.     Mouth/Throat:     Mouth: Mucous membranes are moist.  Cardiovascular:     Rate and Rhythm: Normal rate and regular rhythm.     Pulses: Normal pulses.     Heart sounds: Normal heart sounds.  Pulmonary:     Effort: Pulmonary effort is normal.     Breath sounds: Wheezing present.  Abdominal:     General: Bowel sounds are normal.     Palpations: Abdomen is soft.  Musculoskeletal:        General: Normal range of motion.  Neurological:     Mental Status: She is alert.      UC Treatments /  Results  Labs (all labs ordered are listed, but only abnormal results are displayed) Labs Reviewed - No data to display  EKG   Radiology No results found.  Procedures Procedures (including critical care time)  Medications Ordered in UC Medications - No data to display  Initial Impression / Assessment and Plan / UC Course  I have reviewed the triage vital signs and the nursing notes.  Pertinent labs & imaging results that were available during my care of the patient were reviewed by me and considered in my medical decision making (see chart for details).     1.  Mild persistent asthma with acute exacerbation: Patient's symptoms may be perpetuated by the presence of cats in the house Patient is advised to place the cat litter box away from her home office  Breo Ellipta Singulair daily No indication for antibiotics Patient's lung exam reveals good air entry bilaterally  2.  Uncontrolled hypertension: Add amlodipine to the current regimen. Allergy profile states that patient is not allergic to amlodipine and the allergy is diarrhea On further questioning/discussion the patient denies allergy to amlodipine. She is willing to take amlodipine to help better control her blood pressure. Return precautions given Final Clinical Impressions(s) / UC Diagnoses   Final diagnoses:  Mild persistent asthma with (acute) exacerbation     Discharge Instructions      Your symptoms are likely related to pollen and also domestic animals Please take medications as prescribed You may use the albuterol up to 6 times a day I have added amlodipine to your blood pressure medication as we discussed.  Please monitor for diarrhea and if you have worsening diarrhea please reach out to your primary care provider Please return to urgent care  if you have any other concerns.   ED Prescriptions     Medication Sig Dispense Auth. Provider   montelukast (SINGULAIR) 10 MG tablet Take 1 tablet (10 mg  total) by mouth at bedtime. 60 tablet Gus Littler, Britta Mccreedy, MD   fluticasone furoate-vilanterol (BREO ELLIPTA) 200-25 MCG/ACT AEPB Inhale 1 puff into the lungs daily. 1 each Ajai Harville, Britta Mccreedy, MD   amLODipine (NORVASC) 5 MG tablet Take 1 tablet (5 mg total) by mouth daily. 30 tablet Kipling Graser, Britta Mccreedy, MD      PDMP not reviewed this encounter.   Merrilee Jansky, MD 08/25/22 2149

## 2022-10-03 ENCOUNTER — Ambulatory Visit
Admission: RE | Admit: 2022-10-03 | Discharge: 2022-10-03 | Disposition: A | Discharge status: Home / Self Care | Payer: No Typology Code available for payment source | Source: Ambulatory Visit | Attending: Urgent Care | Admitting: Urgent Care

## 2022-10-03 VITALS — BP 168/70 | HR 82 | Temp 98.8°F | Resp 18

## 2022-10-03 DIAGNOSIS — H1033 Unspecified acute conjunctivitis, bilateral: Secondary | ICD-10-CM

## 2022-10-03 MED ORDER — TOBRAMYCIN 0.3 % OP SOLN: 1.0000 [drp] | OPHTHALMIC | 0 refills | Status: AC

## 2022-10-03 MED ORDER — AZELASTINE HCL 0.05 % OP SOLN: 1.0000 [drp] | Freq: Two times a day (BID) | OPHTHALMIC | 12 refills | Status: AC

## 2022-10-03 NOTE — ED Provider Notes (Signed)
[facility]  Note:  This document was prepared using Conservation officer, historic buildings and may include unintentional dictation errors.  MRN: [id number] DOB: 02-27-1961  Subjective:   [name] is a 62 y.o. female presenting for 3-day history of persistent redness of the left eye, watery discharge now affecting both eyes.  Patient has had difficulty with pinkeye, reports 1-2 episodes per year.  Last episode was a year ago and she was prescribed polymyxin B.  She tried this again this year but is not getting any relief.  No vision changes, double vision, eyelid pain or swelling.   No current facility-administered medications for this encounter.  Current Outpatient Medications:    albuterol (VENTOLIN HFA) 108 (90 Base) MCG/ACT inhaler, Inhale 1-2 puffs into the lungs every 4 (four) hours as needed for wheezing or shortness of breath., Disp: 6.7 each, Rfl: 0   amLODipine (NORVASC) 5 MG tablet, Take 1 tablet (5 mg total) by mouth daily., Disp: 30 tablet, Rfl: 1   Azelastine-Fluticasone 137-50 MCG/ACT SUSP, PLACE 2 SPRAYS INTO THE NOSE DAILY., Disp: 23 g, Rfl: 6   BEE POLLEN PO, Take by mouth., Disp: , Rfl:    docusate sodium (COLACE) 100 MG capsule, Take 1 capsule (100 mg total) by mouth daily as needed for up to 30 doses., Disp: 30 capsule, Rfl: 0   Flaxseed, Linseed, (FLAX SEEDS) POWD, Take 1 capsule by mouth daily., Disp: , Rfl:    fluticasone furoate-vilanterol (BREO ELLIPTA) 200-25 MCG/ACT AEPB, Inhale 1 puff into the lungs daily., Disp: 1 each, Rfl: 0   glucose blood (ACCU-CHEK GUIDE) test strip, Use as instructed to check sugar 3 times daily, Disp: 300 each, Rfl: 1   guaifenesin (HUMIBID E) 400 MG TABS tablet, Take 400 mg by mouth daily., Disp: , Rfl:    Lancets Misc. (ACCU-CHEK MULTICLIX LANCET DEV) KIT, Use as instructed to check sugar 3 times daily, Disp: 1 each, Rfl: prn   montelukast (SINGULAIR) 10 MG tablet, Take 1 tablet (10 mg total) by mouth at  bedtime., Disp: 60 tablet, Rfl: 0   Multiple Vitamin (MULTIVITAMIN WITH MINERALS) TABS tablet, Take 1 tablet by mouth at bedtime. One a Day 50 plus, Disp: , Rfl:    OVER THE COUNTER MEDICATION, Equate Chlorpheniramine maleate 4 mg.  PT reports takes in the am, in the afternoon and in the pm., Disp: , Rfl:    pantoprazole (PROTONIX) 40 MG tablet, Take 1 tablet (40 mg total) by mouth daily., Disp: 90 tablet, Rfl: 3   Semaglutide,0.25 or 0.5MG /DOS, (OZEMPIC, 0.25 OR 0.5 MG/DOSE,) 2 MG/3ML SOPN, Inject 0.25 mg into the skin once a week., Disp: 3 mL, Rfl: 0   spironolactone (ALDACTONE) 50 MG tablet, Take 1 tablet (50 mg total) by mouth daily., Disp: 90 tablet, Rfl: 3   UNABLE TO FIND, Take 1 Scoop by mouth daily. Med Name: Organic Proteins + Greens  Contains: Calcium 25 mg, Potassium 120 mg, Vitamin C 2.4 mg, Phosphorus 200 mg, Zinc 3 mg, Manganese 0.4 mg, Molybdenum 75 mcg, Iron 4 mg, Vitamin A 120 mcg, Vitamin K 32 mcg, Biotin 6 mcg, Magnesium 40 mg, Selenium 25 mcg, and Chromium 9.6 mcg., Disp: , Rfl:    Allergies  Allergen Reactions   Hydralazine Diarrhea and Swelling   Metformin And Related Diarrhea    Diarrhea  intolerance at lowest dose   Olmesartan Other (See Comments)    Felt like insides dying   tried for 3 weeks see text  Amlodipine Diarrhea   Candesartan Cilexetil Other (See Comments)    Reaction to Atacand - possibly drowsiness or diarrhea or nausea   Carvedilol     Other Reaction(s): edema/nausea   Codeine Hives, Itching and Swelling    Possible throat swelling - pt does not recall this reaction   [place] [Dapagliflozin] Other (See Comments)    Memory issues   Hydrochlorothiazide     hypokalemia   Ibuprofen Hives   Telmisartan Other (See Comments)    Reaction to Micardis - possibly drowsiness or diarrhea or nausea   Zithromax [Azithromycin] Diarrhea         Past Medical History:  Diagnosis Date   Allergic rhinitis    Asthma    Diabetes mellitus    GERD  (gastroesophageal reflux disease)    History of kidney stones    Hyperlipidemia    Hypertension    x many years (since birht of her son at 50) Had workup with renal artery angiogram that showed no evidence for fibromuscular dysplasia ( no significant renal artery stenosis.) ECHO (8/11) EF 60-65%, mild LVH, no regional WMAs.   Low back pain    Morbid obesity (HCC)    OSA (obstructive sleep apnea) 12/28/2015     Past Surgical History:  Procedure Laterality Date   ABDOMINAL HYSTERECTOMY     partial for fibroids   CYSTOSCOPY WITH RETROGRADE PYELOGRAM, URETEROSCOPY AND STENT PLACEMENT Left 07/20/2021   Procedure: CYSTOSCOPY WITH RETROGRADE PYELOGRAM, URETEROSCOPY HOLMIUM LASER LITOTRIPSY AND STENT PLACEMENT;  Surgeon: Jannifer Hick, MD;  Location: [facility];  Service: Urology;  Laterality: Left;  60 MINS FOR CASE   DENTAL SURGERY     TUBAL LIGATION      Family History  Problem Relation Age of Onset   Cancer Mother    Hypertension Mother    Other Father        died in MVA   Hypertension Maternal Grandmother    Stroke Maternal Grandmother     Social History   Tobacco Use   Smoking status: Never   Smokeless tobacco: Never  Vaping Use   Vaping Use: Never used  Substance Use Topics   Alcohol use: No    Alcohol/week: 0.0 standard drinks of alcohol   Drug use: Never    ROS   Objective:   Vitals: BP (!) 168/70 (BP Location: Left Arm)   Pulse 82   Temp 98.8 F (37.1 C) (Oral)   Resp 18   SpO2 95%   Physical Exam Constitutional:      General: She is not in acute distress.    Appearance: Normal appearance. She is well-developed. She is not ill-appearing, toxic-appearing or diaphoretic.  HENT:     Head: Normocephalic and atraumatic.     Nose: Nose normal.     Mouth/Throat:     Mouth: Mucous membranes are moist.  Eyes:     General: Lids are normal. Lids are everted, no foreign bodies appreciated. Vision grossly intact. No scleral icterus.       Right eye: No foreign  body, discharge or hordeolum.        Left eye: No foreign body, discharge or hordeolum.     Extraocular Movements: Extraocular movements intact.     Right eye: Normal extraocular motion.     Left eye: Normal extraocular motion and no nystagmus.     Conjunctiva/sclera:     Right eye: Right conjunctiva is not injected. No chemosis, exudate or hemorrhage.    Left eye: Left  conjunctiva is injected. No chemosis, exudate or hemorrhage.    Pupils: Pupils are equal, round, and reactive to light.  Cardiovascular:     Rate and Rhythm: Normal rate.  Pulmonary:     Effort: Pulmonary effort is normal.  Skin:    General: Skin is warm and dry.  Neurological:     General: No focal deficit present.     Mental Status: She is alert and oriented to person, place, and time.  Psychiatric:        Mood and Affect: Mood normal.        Behavior: Behavior normal.     Assessment and Plan :   PDMP not reviewed this encounter.  1. Acute conjunctivitis of both eyes, unspecified acute conjunctivitis type    Suspect patient primarily has allergic conjunctivitis and recommended regular use of azelastine eyedrops.  I was agreeable to help her with a secondary bacterial conjunctivitis with use of tobramycin.  No signs of an acute ophthalmologic emergency. Counseled patient on potential for adverse effects with medications prescribed/recommended today, ER and return-to-clinic precautions discussed, patient verbalized understanding.    Wallis Bamberg, [place] 10/03/22 [phone]

## 2022-10-03 NOTE — ED Triage Notes (Signed)
Pt reports redness, watery, discharge in eyes x 3 days. Polymyxin B gives no relief.

## 2022-10-03 NOTE — Discharge Instructions (Signed)
Tobramycin can be used for typical pink eye. Use 1 week's worth of medicine. Azelastine drops can be used daily long term as needed for irritant or allergic conjunctivitis.

## 2023-04-01 ENCOUNTER — Other Ambulatory Visit: Payer: Self-pay | Admitting: Internal Medicine

## 2023-04-01 DIAGNOSIS — R269 Unspecified abnormalities of gait and mobility: Secondary | ICD-10-CM

## 2024-02-10 ENCOUNTER — Ambulatory Visit: Payer: Self-pay

## 2024-02-10 NOTE — Telephone Encounter (Signed)
 FYI Only or Action Required?: FYI only for provider.  Patient is followed in Pulmonology for asthma, last seen on 2022.  Called Nurse Triage reporting Shortness of Breath.  Symptoms began several weeks ago.  Interventions attempted: OTC medications: cough meds.  Symptoms are: gradually worsening.  Triage Disposition: See HCP Within 4 Hours (Or PCP Triage)  Patient/caregiver understands and will follow disposition?:   Copied from CRM #[id number]. Topic: Clinical - Red Word Triage >> Feb 10, 2024  2:17 PM Essie A wrote: Red Word that prompted transfer to Nurse Triage: Patient hasn't been seen for 3 years.  She is suffering from deep dry cough, shortness of breath for about 3 weeks now.  She is a patient of Dr. Theophilus. Reason for Disposition  [1] MILD difficulty breathing (e.g., minimal/no SOB at rest, SOB with walking, pulse < 100) AND [2] NEW-onset or WORSE than normal  Answer Assessment - Initial Assessment Questions Pt calls with SOB and cough. States she hasn't been seen by pulm in three years but this happens annually and she would like to be re-established. RN attempted to schedule appointment for pt but d/t her not being seen in three years, it will be NP apt. RN recommended UC/ED based on triage sxs. Pt agreeable, pt transferred to [facility] to be scheduled for NP apt.   1. RESPIRATORY STATUS: Describe your breathing? (e.g., wheezing, shortness of breath, unable to speak, severe coughing)      Worsens w/ exertion. Requests inhaler 2. ONSET: When did this breathing problem begin?      3 weeks ago  3. PATTERN Does the difficult breathing come and go, or has it been constant since it started?      Intermittent, worse with exertion.  4. SEVERITY: How bad is your breathing? (e.g., mild, moderate, severe)      mild 5. RECURRENT SYMPTOM: Have you had difficulty breathing before? If Yes, ask: When was the last time? and What happened that time?      Yes,  yearly, feels the same.  6. CARDIAC HISTORY: Do you have any history of heart disease? (e.g., heart attack, angina, bypass surgery, angioplasty)      Htn  7. LUNG HISTORY: Do you have any history of lung disease?  (e.g., pulmonary embolus, asthma, emphysema)     asthma 8. CAUSE: What do you think is causing the breathing problem?      Pt thinks bronchitis 9. OTHER SYMPTOMS: Do you have any other symptoms? (e.g., chest pain, cough, dizziness, fever, runny nose)     cough 10. O2 SATURATION MONITOR:  Do you use an oxygen saturation monitor (pulse oximeter) at home? If Yes, ask: What is your reading (oxygen level) today? What is your usual oxygen saturation reading? (e.g., 95%)       denies  12. TRAVEL: Have you traveled out of the country in the last month? (e.g., travel history, exposures)       denies  Protocols used: Breathing Difficulty-A-AH

## 2024-03-11 ENCOUNTER — Ambulatory Visit: Admitting: Pulmonary Disease

## 2024-05-11 ENCOUNTER — Ambulatory Visit (INDEPENDENT_AMBULATORY_CARE_PROVIDER_SITE_OTHER): Payer: Self-pay | Admitting: Pulmonary Disease

## 2024-05-11 ENCOUNTER — Encounter: Payer: Self-pay | Admitting: Pulmonary Disease

## 2024-05-11 ENCOUNTER — Encounter (HOSPITAL_BASED_OUTPATIENT_CLINIC_OR_DEPARTMENT_OTHER): Payer: Self-pay | Admitting: Family

## 2024-05-11 VITALS — BP 149/84 | HR 75 | Temp 97.6°F | Ht 67.0 in | Wt 299.0 lb

## 2024-05-11 DIAGNOSIS — J45909 Unspecified asthma, uncomplicated: Secondary | ICD-10-CM

## 2024-05-11 DIAGNOSIS — G4733 Obstructive sleep apnea (adult) (pediatric): Secondary | ICD-10-CM

## 2024-05-11 DIAGNOSIS — R058 Other specified cough: Secondary | ICD-10-CM

## 2024-05-11 DIAGNOSIS — J4540 Moderate persistent asthma, uncomplicated: Secondary | ICD-10-CM

## 2024-05-11 DIAGNOSIS — R0600 Dyspnea, unspecified: Secondary | ICD-10-CM

## 2024-05-11 MED ORDER — FLUTICASONE FUROATE-VILANTEROL 200-25 MCG/ACT IN AEPB: 1.0000 | INHALATION_SPRAY | Freq: Every day | RESPIRATORY_TRACT | 3 refills | Status: AC

## 2024-05-11 NOTE — Progress Notes (Signed)
 "              [name]    [id number]    04-Oct-1960  Primary Care Physician:Pa, [facility]  Referring Physician: [facility] [address] [place],  [place] [address]  Chief complaint:   Follow up for upper airway cough syndrome. OSA  HPI: 64 y.o. with past medical history of severe untreated sleep apnea, diabetes, hypertension, hyperlipidemia.  Originally seen in 2018 for upper airway cough after an episode of bronchitis.  This responded to treatment with antihistamine, nasal spray and antiacid medication.  She did notice episode of bronchitis in early 2019 which has been slow to resolve.  Seen by nurse practitioner in clinic a couple of times with slow to resolve flare.  Work-up including labs, echocardiogram, chest x-ray has been unrevealing.  She was on Symbicort at some point but stopped as it was not helping with her symptoms.  She has history of sleep apnea and started on CPAP at 16 in 2017. She stopped using it since the mask was suffocating her. The CPAP machine was taken back by the insurance company because she was not using it. She wants to retry the CPAP with a different interface now but was told that she may need a new sleep study.  Interim History: Discussed the use of AI scribe software for clinical note transcription with the patient, who gave verbal consent to proceed.  History of Present Illness [name] is a 64 year old female with asthma who presents for a routine visit and medication refill.  Asthma and respiratory symptoms - Asthma with annual fall episodes of bronchitis-like illness characterized by cough and shortness of breath - Uses Breo only during symptomatic periods; currently asymptomatic but requests refill for future exacerbations - Occasional cough, which worsens during respiratory illnesses - Avoids montelukast  due to concern for side effects - No current respiratory symptoms  Recent upper  respiratory symptoms - Recent brief episode of sore throat and mild nausea after visiting a car service center - Symptoms resolved with over-the-counter remedies - Concern about potential for upcoming illness, but currently asymptomatic  Gastroesophageal reflux symptoms - Acid reflux managed with dietary modifications - No recent use of pantoprazole   Obstructive sleep apnea - History of sleep apnea - Discontinued CPAP therapy due to discomfort and cost  Psychosocial stressors and impact on self-care - Significant stress related to caring for hospitalized husband after stroke - Stress may limit ability to focus on personal treatments    Outpatient Encounter Medications as of 05/11/2024  Medication Sig   albuterol  (VENTOLIN  HFA) 108 (90 Base) MCG/ACT inhaler Inhale 1-2 puffs into the lungs every 4 (four) hours as needed for wheezing or shortness of breath.   amLODipine  (NORVASC ) 5 MG tablet Take 1 tablet (5 mg total) by mouth daily.   azelastine  (OPTIVAR ) 0.05 % ophthalmic solution Place 1 drop into both eyes 2 (two) times daily.   BEE POLLEN PO Take by mouth.   fluticasone  furoate-vilanterol (BREO ELLIPTA ) 200-25 MCG/ACT AEPB Inhale 1 puff into the lungs daily.   glucose blood (ACCU-CHEK GUIDE) test strip Use as instructed to check sugar 3 times daily   Lancets Misc. (ACCU-CHEK MULTICLIX LANCET DEV) KIT Use as instructed to check sugar 3 times daily   Multiple Vitamin (MULTIVITAMIN WITH MINERALS) TABS tablet Take 1 tablet by mouth at bedtime. One a Day 50 plus   Azelastine -Fluticasone  137-50 MCG/ACT SUSP PLACE 2 SPRAYS INTO THE NOSE DAILY. (Patient not  taking: Reported on 05/11/2024)   docusate sodium  (COLACE) 100 MG capsule Take 1 capsule (100 mg total) by mouth daily as needed for up to 30 doses. (Patient not taking: Reported on 05/11/2024)   Flaxseed, Linseed, (FLAX SEEDS) POWD Take 1 capsule by mouth daily. (Patient not taking: Reported on 05/11/2024)   guaifenesin (HUMIBID E) 400 MG TABS  tablet Take 400 mg by mouth daily. (Patient not taking: Reported on 05/11/2024)   montelukast  (SINGULAIR ) 10 MG tablet Take 1 tablet (10 mg total) by mouth at bedtime. (Patient not taking: Reported on 05/11/2024)   OVER THE COUNTER MEDICATION Equate Chlorpheniramine  maleate 4 mg.  PT reports takes in the am, in the afternoon and in the pm. (Patient not taking: Reported on 05/11/2024)   pantoprazole  (PROTONIX ) 40 MG tablet Take 1 tablet (40 mg total) by mouth daily. (Patient not taking: Reported on 05/11/2024)   Semaglutide ,0.25 or 0.5MG /DOS, (OZEMPIC , 0.25 OR 0.5 MG/DOSE,) 2 MG/3ML SOPN Inject 0.25 mg into the skin once a week. (Patient not taking: Reported on 05/11/2024)   spironolactone  (ALDACTONE ) 50 MG tablet Take 1 tablet (50 mg total) by mouth daily. (Patient not taking: Reported on 05/11/2024)   tobramycin  (TOBREX ) 0.3 % ophthalmic solution Place 1 drop into both eyes every 4 (four) hours. (Patient not taking: Reported on 05/11/2024)   UNABLE TO FIND Take 1 Scoop by mouth daily. Med Name: Organic Proteins + Greens  Contains: Calcium 25 mg, Potassium 120 mg, Vitamin C 2.4 mg, Phosphorus 200 mg, Zinc 3 mg, Manganese 0.4 mg, Molybdenum 75 mcg, Iron 4 mg, Vitamin A 120 mcg, Vitamin K 32 mcg, Biotin 6 mcg, Magnesium 40 mg, Selenium 25 mcg, and Chromium 9.6 mcg. (Patient not taking: Reported on 05/11/2024)   No facility-administered encounter medications on file as of 05/11/2024.   Vitals:   05/11/24 1007  Pulse: 75  Temp: 97.6 F (36.4 C)  Height: 5' 7 (1.702 m)  Weight: 299 lb (135.6 kg)  SpO2: 95%  TempSrc: Oral  BMI (Calculated): 46.82     Physical Exam GEN: No acute distress. CV: Regular rate and rhythm, no murmurs. LUNGS: Clear to auscultation bilaterally, normal respiratory effort. SKIN JOINTS: Warm and dry, no rash.    Data Reviewed: FENO  06/05/16- 17 10/28/17- 13  Chest x-ray 06/02/16- no active cardio pulmonary disease Chest x-ray 07/25/17- stable cardiomegaly.  No active cardiopulmonary  disease.  PFTs 07/24/16 FVC 2.17 (69%), FEV1 1.86 (75%), F/F 86, TLC 71%, DLCO 77%, DLCO/VA 132%  Allergy  test 06/24/16- sensitivity to cockroach, ragweed, dust mite. IgE 170  Sleep study 12/06/15, AHI 43.6, SaO2 low 55%.  Echocardiogram 07/23/2017-Left ventricle: The cavity size was normal. Wall thickness was   increased in a pattern of mild LVH. Systolic function was normal.   The estimated ejection fraction was in the range of 55% to 60%.   Assessment and Plan Assessment & Plan Asthma Intermittent asthma exacerbations, typically occurring annually. Currently not experiencing an exacerbation. Previously managed with Breo inhaler as needed during illness. Not on Singulair  due to personal preference.  - Sent prescription for Breo inhaler for use during exacerbations. - Advised to refill Breo as needed when experiencing symptoms.   Chronic upper airway cough Dyspnea Likely contribution from postnasal drip, acid reflux, untreated OSA Does not want to go back on Protonix , antihistamine or nasal spray. Monitor symptoms  Untreated OSA She is not interested in a sleep study or CPAP at this time.   Plan/Recommendations: - Continue Breo daily   - Continue antacid  med, chlorpheniramine , dymista , singulair   - Weight loss exercise  [facility] 05/11/2024, 10:16 AM  CC: Pa, [employer]  "

## 2024-05-11 NOTE — Patient Instructions (Signed)
" °  VISIT SUMMARY: Today we discussed your asthma management and provided a refill for your Breo inhaler. We also reviewed your recent upper respiratory symptoms, acid reflux, and sleep apnea.  YOUR PLAN: ASTHMA: You have intermittent asthma exacerbations, usually once a year, and currently have no symptoms. -A prescription for Breo inhaler has been sent for use during exacerbations. -Refill Breo as needed when experiencing symptoms.  RECENT UPPER RESPIRATORY SYMPTOMS: You had a brief episode of sore throat and mild nausea, which has resolved. -Monitor for any new symptoms and use over-the-counter remedies as needed.  GASTROESOPHAGEAL REFLUX: Your acid reflux is managed with dietary changes. -Continue with dietary modifications to manage acid reflux.  OBSTRUCTIVE SLEEP APNEA: You have a history of sleep apnea but discontinued CPAP therapy due to discomfort and cost. -Consider future use of CPAP therapy if symptoms worsen. "

## 2024-06-08 ENCOUNTER — Ambulatory Visit (HOSPITAL_BASED_OUTPATIENT_CLINIC_OR_DEPARTMENT_OTHER): Payer: Self-pay | Admitting: Family

## 2024-06-10 ENCOUNTER — Telehealth: Payer: Self-pay | Admitting: Cardiovascular Disease

## 2024-06-10 MED ORDER — SPIRONOLACTONE 50 MG PO TABS: 50.0000 mg | ORAL_TABLET | Freq: Every day | ORAL | 0 refills | Status: AC

## 2024-06-10 NOTE — Telephone Encounter (Signed)
" °*  STAT* If patient is at the pharmacy, call can be transferred to refill team.   1. Which medications need to be refilled? (please list name of each medication and dose if known) spironolactone  (ALDACTONE ) 50 MG tablet    2. Which pharmacy/location (including street and city if local pharmacy) is medication to be sent to?  [id number] - [place], [place] - [address]    3. Do they need a 30 day or 90 day supply? 90  Patient has appt on 3/12  "

## 2024-07-15 ENCOUNTER — Encounter (HOSPITAL_BASED_OUTPATIENT_CLINIC_OR_DEPARTMENT_OTHER): Payer: Self-pay | Admitting: Family
